# Patient Record
Sex: Male | Born: 1935 | Race: White | Hispanic: No | Smoking: Former smoker
Health system: Southern US, Community
[De-identification: ages and names within clinical notes are randomized; demographics above are authoritative.]

## PROBLEM LIST (undated history)

## (undated) DIAGNOSIS — N429 Disorder of prostate, unspecified: Secondary | ICD-10-CM

## (undated) DIAGNOSIS — E7800 Pure hypercholesterolemia, unspecified: Secondary | ICD-10-CM

## (undated) DIAGNOSIS — I442 Atrioventricular block, complete: Secondary | ICD-10-CM

## (undated) DIAGNOSIS — H9190 Unspecified hearing loss, unspecified ear: Secondary | ICD-10-CM

## (undated) DIAGNOSIS — Z950 Presence of cardiac pacemaker: Secondary | ICD-10-CM

## (undated) DIAGNOSIS — H409 Unspecified glaucoma: Secondary | ICD-10-CM

## (undated) DIAGNOSIS — R42 Dizziness and giddiness: Secondary | ICD-10-CM

## (undated) DIAGNOSIS — Z9189 Other specified personal risk factors, not elsewhere classified: Secondary | ICD-10-CM

## (undated) HISTORY — PX: HERNIA REPAIR: SHX51

## (undated) HISTORY — DX: Pure hypercholesterolemia, unspecified: E78.00

## (undated) HISTORY — PX: CHOLECYSTECTOMY: SHX55

---
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
## 2000-01-06 ENCOUNTER — Other Ambulatory Visit: Admission: RE | Admit: 2000-01-06 | Discharge: 2000-01-06 | Payer: Self-pay | Admitting: Urology

## 2001-05-16 ENCOUNTER — Other Ambulatory Visit: Admission: RE | Admit: 2001-05-16 | Discharge: 2001-05-16 | Payer: Self-pay | Admitting: Dermatology

## 2001-06-04 ENCOUNTER — Ambulatory Visit (HOSPITAL_COMMUNITY): Admission: RE | Admit: 2001-06-04 | Discharge: 2001-06-04 | Payer: Self-pay | Admitting: Family Medicine

## 2001-06-04 ENCOUNTER — Encounter: Payer: Self-pay | Admitting: Family Medicine

## 2001-06-10 ENCOUNTER — Encounter: Payer: Self-pay | Admitting: Family Medicine

## 2001-06-10 ENCOUNTER — Ambulatory Visit (HOSPITAL_COMMUNITY): Admission: RE | Admit: 2001-06-10 | Discharge: 2001-06-10 | Payer: Self-pay | Admitting: Family Medicine

## 2001-09-05 ENCOUNTER — Encounter: Payer: Self-pay | Admitting: Urology

## 2001-09-05 ENCOUNTER — Ambulatory Visit (HOSPITAL_COMMUNITY): Admission: RE | Admit: 2001-09-05 | Discharge: 2001-09-05 | Payer: Self-pay | Admitting: Urology

## 2003-03-26 ENCOUNTER — Encounter: Payer: Self-pay | Admitting: Family Medicine

## 2003-03-26 ENCOUNTER — Ambulatory Visit (HOSPITAL_COMMUNITY): Admission: RE | Admit: 2003-03-26 | Discharge: 2003-03-26 | Payer: Self-pay | Admitting: Family Medicine

## 2004-03-21 ENCOUNTER — Emergency Department (HOSPITAL_COMMUNITY): Admission: EM | Admit: 2004-03-21 | Discharge: 2004-03-21 | Payer: Self-pay | Admitting: Emergency Medicine

## 2004-10-03 ENCOUNTER — Other Ambulatory Visit: Admission: RE | Admit: 2004-10-03 | Discharge: 2004-10-03 | Payer: Self-pay | Admitting: Dermatology

## 2005-04-13 ENCOUNTER — Ambulatory Visit (HOSPITAL_COMMUNITY): Admission: RE | Admit: 2005-04-13 | Discharge: 2005-04-13 | Payer: Self-pay | Admitting: Family Medicine

## 2005-06-05 ENCOUNTER — Emergency Department (HOSPITAL_COMMUNITY): Admission: EM | Admit: 2005-06-05 | Discharge: 2005-06-05 | Payer: Self-pay | Admitting: Emergency Medicine

## 2007-02-22 ENCOUNTER — Ambulatory Visit (HOSPITAL_COMMUNITY): Admission: RE | Admit: 2007-02-22 | Discharge: 2007-02-22 | Payer: Self-pay | Admitting: Family Medicine

## 2011-12-07 ENCOUNTER — Encounter: Payer: Self-pay | Admitting: Internal Medicine

## 2011-12-08 ENCOUNTER — Encounter: Payer: Self-pay | Admitting: Internal Medicine

## 2011-12-08 ENCOUNTER — Ambulatory Visit (INDEPENDENT_AMBULATORY_CARE_PROVIDER_SITE_OTHER)
Admission: RE | Admit: 2011-12-08 | Discharge: 2011-12-08 | Disposition: A | Discharge status: Home / Self Care | Payer: Medicare Other | Source: Ambulatory Visit | Attending: Internal Medicine | Admitting: Internal Medicine

## 2011-12-08 ENCOUNTER — Ambulatory Visit (INDEPENDENT_AMBULATORY_CARE_PROVIDER_SITE_OTHER): Payer: Medicare Other | Admitting: Internal Medicine

## 2011-12-08 VITALS — BP 118/62 | HR 66 | Ht 68.0 in | Wt 165.8 lb

## 2011-12-08 DIAGNOSIS — J111 Influenza due to unidentified influenza virus with other respiratory manifestations: Secondary | ICD-10-CM

## 2011-12-08 DIAGNOSIS — J40 Bronchitis, not specified as acute or chronic: Secondary | ICD-10-CM

## 2011-12-08 DIAGNOSIS — H409 Unspecified glaucoma: Secondary | ICD-10-CM

## 2011-12-08 MED ORDER — AZITHROMYCIN 250 MG PO TABS: ORAL_TABLET | ORAL | Status: AC

## 2011-12-08 NOTE — Progress Notes (Signed)
12/08/11- 81 yoM former smoker referred by Dr Laverle Hobby of [facility] in [place], concerned about abnormal CXR. Mr. Bukhari smoked 2 packs per day for 12-15 years, quitting around 1948 but continuing snuff and chewing tobacco. He has not been aware of lung disease. Had flu vaccine. 3 or 4 weeks ago he had a head and chest cold with persistent cough. He improved then got worse again with sore throat. Cough was nonproductive. Fever for 2 days with myalgias. He hasn't been significantly short of breath. He went to the urgent care in [place] on December 26 her temperature was 98. Chest x-ray showed flattening of diaphragms especially on lateral view with question of pleural effusion and he was referred here. He has not been on antibiotics. Dr. Valentina Lucks feels stable without fever, chest pain, weight loss, swelling or nodes. I reviewed the chest x-ray images on disc provided and think we are probably seeing diaphragm flattening shadows rather than an effusion. He is a widower, retired from [employer]. 3 brothers were smokers, all dying of lung cancer.  ROS-see HPI Constitutional:   No-   weight loss, night sweats, fevers, chills, fatigue, lassitude. HEENT:   No-  headaches, difficulty swallowing, tooth/dental problems, sore throat,       No-  sneezing, itching, ear ache, nasal congestion, post nasal drip,  CV:  No-   chest pain, orthopnea, PND, swelling in lower extremities, anasarca, dizziness, palpitations Resp: Improving shortness of breath with exertion or at rest.              No-   productive cough,  Improved non-productive cough,  No- coughing up of blood.              No-   change in color of mucus.  No- wheezing.   Skin: No-   rash or lesions. GI:  No-   heartburn, indigestion, abdominal pain, nausea, vomiting, diarrhea,                 change in bowel habits, + loss of appetite just in last few days GU: MS:  No-   joint pain or swelling.  No-  decreased range of motion.  No- back pain. Neuro-     nothing unusual Psych:  No- change in mood or affect. No depression or anxiety.  No memory loss.  OBJ General- Alert, Oriented, Affect-appropriate, Distress- none acute, elderlySkin- rash-none, lesions- none, excoriation- none Lymphadenopathy- none Head- atraumatic            Eyes- Gross vision intact, PERRLA, conjunctivae clear secretions            Ears- Hearing, canals-normal            Nose- Clear, no-Septal dev, mucus, polyps, erosion, perforation             Throat- Mallampati II , mucosa red , drainage- none, tonsils- atrophic Neck- flexible , trachea midline, no stridor , thyroid nl, carotid no bruit Chest - symmetrical excursion , unlabored           Heart/CV- RRR , no murmur , no gallop  , no rub, nl s1 s2                           - JVD- none , edema- none, stasis changes- none, varices- none           Lung- bibasilar crackles, diminished in bases, wheeze- none, cough- none ,  dullness-none, rub- none           Chest wall-  Abd- tender-no, distended-no, bowel sounds-present, HSM- no Br/ Gen/ Rectal- Not done, not indicated Extrem- cyanosis- none, clubbing, none, atrophy- none, strength- nl Neuro- grossly intact to observation

## 2011-12-08 NOTE — Patient Instructions (Addendum)
Order CXR  Dx bronchitis   We will call you with the report- probably next week. If needed, we will get you back here for a follow-up visit.   Script sent for Z pak antibiotic

## 2011-12-10 ENCOUNTER — Encounter: Payer: Self-pay | Admitting: Internal Medicine

## 2011-12-10 DIAGNOSIS — J111 Influenza due to unidentified influenza virus with other respiratory manifestations: Secondary | ICD-10-CM | POA: Insufficient documentation

## 2011-12-10 DIAGNOSIS — H409 Unspecified glaucoma: Secondary | ICD-10-CM | POA: Insufficient documentation

## 2011-12-10 NOTE — Assessment & Plan Note (Signed)
He has had an acute bronchitis syndrome with a flulike illness. There is very likely to be some degree of underlying COPD which has not been significant enough that he paid attention. A pleural effusion was questioned but I don't think I see that on the available films. Plan-chest x-ray here, supportive care for viral bronchitis emphasizing fluids. Z-Pak to hold. No followup visit scheduled yet, respecting the effort it takes for him to get here. He does understand that I will be happy to see him again if this process does not clear or if our chest x-ray is abnormal..

## 2011-12-11 ENCOUNTER — Other Ambulatory Visit: Payer: Self-pay | Admitting: Internal Medicine

## 2011-12-11 DIAGNOSIS — R05 Cough: Secondary | ICD-10-CM

## 2011-12-11 NOTE — Progress Notes (Signed)
Quick Note:  Spoke with patient appt made for 01/09/2012 cxr at 1:45 appt with CY at 2:15 ______

## 2012-01-09 ENCOUNTER — Ambulatory Visit (INDEPENDENT_AMBULATORY_CARE_PROVIDER_SITE_OTHER): Payer: Medicare Other | Admitting: Internal Medicine

## 2012-01-09 ENCOUNTER — Ambulatory Visit (INDEPENDENT_AMBULATORY_CARE_PROVIDER_SITE_OTHER)
Admission: RE | Admit: 2012-01-09 | Discharge: 2012-01-09 | Disposition: A | Discharge status: Home / Self Care | Payer: Medicare Other | Source: Ambulatory Visit | Attending: Internal Medicine | Admitting: Internal Medicine

## 2012-01-09 ENCOUNTER — Encounter: Payer: Self-pay | Admitting: Internal Medicine

## 2012-01-09 VITALS — BP 118/66 | HR 74 | Ht 68.0 in | Wt 175.0 lb

## 2012-01-09 DIAGNOSIS — R918 Other nonspecific abnormal finding of lung field: Secondary | ICD-10-CM | POA: Diagnosis not present

## 2012-01-09 DIAGNOSIS — R05 Cough: Secondary | ICD-10-CM | POA: Diagnosis not present

## 2012-01-09 DIAGNOSIS — J111 Influenza due to unidentified influenza virus with other respiratory manifestations: Secondary | ICD-10-CM | POA: Diagnosis not present

## 2012-01-09 DIAGNOSIS — J9819 Other pulmonary collapse: Secondary | ICD-10-CM | POA: Diagnosis not present

## 2012-01-09 DIAGNOSIS — R9389 Abnormal findings on diagnostic imaging of other specified body structures: Secondary | ICD-10-CM

## 2012-01-09 NOTE — Patient Instructions (Signed)
Please call as needed 

## 2012-01-09 NOTE — Progress Notes (Signed)
12/08/11- 78 yoM former smoker referred by Dr Laverle Hobby of [facility] in [place], concerned about abnormal CXR. Chase Chase smoked 2 packs per day for 12-15 years, quitting around 1948 but continuing snuff and chewing tobacco. He has not been aware of lung disease. Had flu vaccine. 3 or 4 weeks ago he had a head and chest cold with persistent cough. He improved then got worse again with sore throat. Cough was nonproductive. Fever for 2 days with myalgias. He hasn't been significantly short of breath. He went to the urgent care in [place] on December 26 where temperature was 98. Chest x-ray showed flattening of diaphragms especially on lateral view with question of pleural effusion and he was referred here. He has not been on antibiotics. Chase Chase feels stable without fever, chest pain, weight loss, swelling or nodes. I reviewed the chest x-ray images on disc provided and think we are probably seeing diaphragm flattening shadows rather than an effusion. He is a widower, retired from [employer]. 3 brothers were smokers, all dying of lung cancer.  01/09/12-  36 yoM former smoker referred by Dr Laverle Hobby of [facility] in [place], concerned about abnormal CXR Chase Chase feels back to her normal baseline for him and denies cough or any chest discomfort or dyspnea. He did take the Z-Pak we gave at last visit.  ROS-see HPI Constitutional:   No-   weight loss, night sweats, fevers, chills, fatigue, lassitude. HEENT:   No-  headaches, difficulty swallowing, tooth/dental problems, sore throat,       No-  sneezing, itching, ear ache, nasal congestion, post nasal drip,  CV:  No-   chest pain, orthopnea, PND, swelling in lower extremities, anasarca, dizziness, palpitations Resp: Improving shortness of breath with exertion or at rest.              No-   productive cough,  Improved non-productive cough,  No- coughing up of  blood.              No-   change in color of mucus.  No- wheezing.   Skin: No-   rash or lesions. GI:  No-   heartburn, indigestion, abdominal pain, nausea, vomiting, diarrhea,                 change in bowel habits, + loss of appetite just in last few days GU: MS:  No-   joint pain or swelling.  No- decreased range of motion.  No- back pain. Neuro-     nothing unusual Psych:  No- change in mood or affect. No depression or anxiety.  No memory loss.  OBJ General- Alert, Oriented, Affect-appropriate, Distress- none acute, elderlySkin- rash-none, lesions- none, excoriation- none Lymphadenopathy- none Head- atraumatic            Eyes- Gross vision intact, PERRLA, conjunctivae clear secretions            Ears- Hearing, canals-normal            Nose- Clear, no-Septal dev, mucus, polyps, erosion, perforation             Throat- Mallampati II , mucosa red , drainage- none, tonsils- atrophic Neck- flexible , trachea midline, no stridor , thyroid nl, carotid no bruit Chest - symmetrical excursion , unlabored           Heart/CV- RRR , no murmur , no gallop  , no rub, nl s1 s2                           -  JVD- none , edema- none, stasis changes- none, varices- none           Lung- coarse breath sounds,  wheeze- none, cough- none , dullness-none, rub- none           Chest wall-  Abd- Br/ Gen/ Rectal- Not done, not indicated Extrem- cyanosis- none, clubbing, none, atrophy- none, strength- nl Neuro- grossly intact to observation

## 2012-01-10 ENCOUNTER — Encounter: Payer: Self-pay | Admitting: Internal Medicine

## 2012-01-10 DIAGNOSIS — R9389 Abnormal findings on diagnostic imaging of other specified body structures: Secondary | ICD-10-CM | POA: Insufficient documentation

## 2012-01-10 NOTE — Assessment & Plan Note (Signed)
He feels his acute symptoms are back to baseline for him now and he is not concerned at all. I I assume he has some degree of underlying COPD from his previous years of smoking, but at age 78 without acute complaint, I have not suggested PFT or medications for now.

## 2012-01-10 NOTE — Assessment & Plan Note (Signed)
Chest x-ray was done on arrival and I reviewed the images with him before radiologic interpretation was available. On my small screen, I noted some hyperinflation and some scarring changes in the bases but no effusion. At the time he left the office, our understanding was that there was no active disease, and no effusion requiring attention. We are calling him back to return for followup chest x-ray in 2 months because of the radiology report.

## 2012-01-17 NOTE — Progress Notes (Signed)
Quick Note:  LMTCB ______ 

## 2012-01-26 ENCOUNTER — Telehealth: Payer: Self-pay | Admitting: Internal Medicine

## 2012-01-26 DIAGNOSIS — R9389 Abnormal findings on diagnostic imaging of other specified body structures: Secondary | ICD-10-CM

## 2012-01-26 NOTE — Telephone Encounter (Signed)
CXR- I had told him, based on my small screen images, that the CXR looked clear. Unfortunately, the radiologist, looking at larger images, noted a shadow in the left lung that they want [place] to look at again. I would like him to come back for another CXR here in 2 months for comparison.  I spoke with pt and is aware of CDY response. He is aware ot come back in 2 months for a comparison and order has been placed in [facility]. Pt aware once he has this done will call him with the results

## 2012-01-26 NOTE — Progress Notes (Signed)
Quick Note:  LMTCB ______ 

## 2012-03-18 DIAGNOSIS — N400 Enlarged prostate without lower urinary tract symptoms: Secondary | ICD-10-CM | POA: Diagnosis not present

## 2012-03-18 DIAGNOSIS — R972 Elevated prostate specific antigen [PSA]: Secondary | ICD-10-CM | POA: Diagnosis not present

## 2012-03-19 DIAGNOSIS — N401 Enlarged prostate with lower urinary tract symptoms: Secondary | ICD-10-CM | POA: Diagnosis not present

## 2012-03-21 ENCOUNTER — Ambulatory Visit (INDEPENDENT_AMBULATORY_CARE_PROVIDER_SITE_OTHER)
Admission: RE | Admit: 2012-03-21 | Discharge: 2012-03-21 | Disposition: A | Discharge status: Home / Self Care | Payer: Medicare Other | Source: Ambulatory Visit | Attending: Internal Medicine | Admitting: Internal Medicine

## 2012-03-21 DIAGNOSIS — R918 Other nonspecific abnormal finding of lung field: Secondary | ICD-10-CM

## 2012-03-21 DIAGNOSIS — J984 Other disorders of lung: Secondary | ICD-10-CM | POA: Diagnosis not present

## 2012-03-21 DIAGNOSIS — R9389 Abnormal findings on diagnostic imaging of other specified body structures: Secondary | ICD-10-CM

## 2012-03-22 NOTE — Progress Notes (Signed)
Quick Note:  Pt aware of results. ______ 

## 2012-04-04 DIAGNOSIS — R972 Elevated prostate specific antigen [PSA]: Secondary | ICD-10-CM | POA: Diagnosis not present

## 2012-04-04 DIAGNOSIS — N400 Enlarged prostate without lower urinary tract symptoms: Secondary | ICD-10-CM | POA: Diagnosis not present

## 2012-05-20 DIAGNOSIS — Z125 Encounter for screening for malignant neoplasm of prostate: Secondary | ICD-10-CM | POA: Diagnosis not present

## 2012-05-20 DIAGNOSIS — K219 Gastro-esophageal reflux disease without esophagitis: Secondary | ICD-10-CM | POA: Diagnosis not present

## 2012-05-20 DIAGNOSIS — Z6826 Body mass index (BMI) 26.0-26.9, adult: Secondary | ICD-10-CM | POA: Diagnosis not present

## 2012-05-20 DIAGNOSIS — E785 Hyperlipidemia, unspecified: Secondary | ICD-10-CM | POA: Diagnosis not present

## 2012-05-20 DIAGNOSIS — Z79899 Other long term (current) drug therapy: Secondary | ICD-10-CM | POA: Diagnosis not present

## 2012-05-20 DIAGNOSIS — I10 Essential (primary) hypertension: Secondary | ICD-10-CM | POA: Diagnosis not present

## 2012-06-05 DIAGNOSIS — W57XXXA Bitten or stung by nonvenomous insect and other nonvenomous arthropods, initial encounter: Secondary | ICD-10-CM | POA: Diagnosis not present

## 2012-06-05 DIAGNOSIS — Z23 Encounter for immunization: Secondary | ICD-10-CM | POA: Diagnosis not present

## 2012-06-05 DIAGNOSIS — T148 Other injury of unspecified body region: Secondary | ICD-10-CM | POA: Diagnosis not present

## 2012-06-05 DIAGNOSIS — S41109A Unspecified open wound of unspecified upper arm, initial encounter: Secondary | ICD-10-CM | POA: Diagnosis not present

## 2012-09-19 DIAGNOSIS — Z23 Encounter for immunization: Secondary | ICD-10-CM | POA: Diagnosis not present

## 2012-10-08 DIAGNOSIS — J029 Acute pharyngitis, unspecified: Secondary | ICD-10-CM | POA: Diagnosis not present

## 2012-10-08 DIAGNOSIS — F528 Other sexual dysfunction not due to a substance or known physiological condition: Secondary | ICD-10-CM | POA: Diagnosis not present

## 2012-10-08 DIAGNOSIS — IMO0002 Reserved for concepts with insufficient information to code with codable children: Secondary | ICD-10-CM | POA: Diagnosis not present

## 2013-01-27 DIAGNOSIS — H52229 Regular astigmatism, unspecified eye: Secondary | ICD-10-CM | POA: Diagnosis not present

## 2013-01-27 DIAGNOSIS — H4011X0 Primary open-angle glaucoma, stage unspecified: Secondary | ICD-10-CM | POA: Diagnosis not present

## 2013-01-27 DIAGNOSIS — H5231 Anisometropia: Secondary | ICD-10-CM | POA: Diagnosis not present

## 2013-01-27 DIAGNOSIS — H524 Presbyopia: Secondary | ICD-10-CM | POA: Diagnosis not present

## 2013-01-28 DIAGNOSIS — H40059 Ocular hypertension, unspecified eye: Secondary | ICD-10-CM | POA: Diagnosis not present

## 2013-02-11 DIAGNOSIS — H16009 Unspecified corneal ulcer, unspecified eye: Secondary | ICD-10-CM | POA: Diagnosis not present

## 2013-04-08 ENCOUNTER — Emergency Department (HOSPITAL_COMMUNITY): Payer: Medicare Other

## 2013-04-08 ENCOUNTER — Inpatient Hospital Stay (HOSPITAL_COMMUNITY)
Admission: EM | Admit: 2013-04-08 | Discharge: 2013-04-10 | DRG: 690 | Disposition: A | Discharge status: Home / Self Care | Payer: Medicare Other | Attending: Internal Medicine | Admitting: Internal Medicine

## 2013-04-08 ENCOUNTER — Encounter (HOSPITAL_COMMUNITY): Payer: Self-pay

## 2013-04-08 DIAGNOSIS — Z803 Family history of malignant neoplasm of breast: Secondary | ICD-10-CM | POA: Diagnosis not present

## 2013-04-08 DIAGNOSIS — R51 Headache: Secondary | ICD-10-CM | POA: Diagnosis not present

## 2013-04-08 DIAGNOSIS — E860 Dehydration: Secondary | ICD-10-CM | POA: Diagnosis not present

## 2013-04-08 DIAGNOSIS — A419 Sepsis, unspecified organism: Secondary | ICD-10-CM

## 2013-04-08 DIAGNOSIS — B964 Proteus (mirabilis) (morganii) as the cause of diseases classified elsewhere: Secondary | ICD-10-CM | POA: Diagnosis present

## 2013-04-08 DIAGNOSIS — R42 Dizziness and giddiness: Secondary | ICD-10-CM | POA: Diagnosis present

## 2013-04-08 DIAGNOSIS — N390 Urinary tract infection, site not specified: Secondary | ICD-10-CM | POA: Diagnosis not present

## 2013-04-08 DIAGNOSIS — J111 Influenza due to unidentified influenza virus with other respiratory manifestations: Secondary | ICD-10-CM

## 2013-04-08 DIAGNOSIS — R404 Transient alteration of awareness: Secondary | ICD-10-CM | POA: Diagnosis not present

## 2013-04-08 DIAGNOSIS — R509 Fever, unspecified: Secondary | ICD-10-CM | POA: Diagnosis not present

## 2013-04-08 DIAGNOSIS — R9431 Abnormal electrocardiogram [ECG] [EKG]: Secondary | ICD-10-CM | POA: Diagnosis not present

## 2013-04-08 DIAGNOSIS — H409 Unspecified glaucoma: Secondary | ICD-10-CM | POA: Diagnosis present

## 2013-04-08 DIAGNOSIS — S0993XA Unspecified injury of face, initial encounter: Secondary | ICD-10-CM | POA: Diagnosis not present

## 2013-04-08 DIAGNOSIS — Z801 Family history of malignant neoplasm of trachea, bronchus and lung: Secondary | ICD-10-CM

## 2013-04-08 DIAGNOSIS — M542 Cervicalgia: Secondary | ICD-10-CM | POA: Diagnosis not present

## 2013-04-08 DIAGNOSIS — E7800 Pure hypercholesterolemia, unspecified: Secondary | ICD-10-CM | POA: Diagnosis present

## 2013-04-08 DIAGNOSIS — Z87891 Personal history of nicotine dependence: Secondary | ICD-10-CM

## 2013-04-08 DIAGNOSIS — D72829 Elevated white blood cell count, unspecified: Secondary | ICD-10-CM | POA: Diagnosis present

## 2013-04-08 DIAGNOSIS — R55 Syncope and collapse: Secondary | ICD-10-CM | POA: Diagnosis not present

## 2013-04-08 DIAGNOSIS — S0990XA Unspecified injury of head, initial encounter: Secondary | ICD-10-CM | POA: Diagnosis not present

## 2013-04-08 DIAGNOSIS — J984 Other disorders of lung: Secondary | ICD-10-CM | POA: Diagnosis not present

## 2013-04-08 DIAGNOSIS — R7309 Other abnormal glucose: Secondary | ICD-10-CM | POA: Diagnosis present

## 2013-04-08 DIAGNOSIS — Z87898 Personal history of other specified conditions: Secondary | ICD-10-CM

## 2013-04-08 DIAGNOSIS — R9389 Abnormal findings on diagnostic imaging of other specified body structures: Secondary | ICD-10-CM

## 2013-04-08 HISTORY — DX: Disorder of prostate, unspecified: N42.9

## 2013-04-08 LAB — URINALYSIS, ROUTINE W REFLEX MICROSCOPIC
Glucose, UA: NEGATIVE mg/dL
Nitrite: NEGATIVE
Specific Gravity, Urine: 1.02 (ref 1.005–1.030)
pH: 6 (ref 5.0–8.0)

## 2013-04-08 LAB — CBC WITH DIFFERENTIAL/PLATELET
Basophils Relative: 0 % (ref 0–1)
Eosinophils Absolute: 0 10*3/uL (ref 0.0–0.7)
Eosinophils Relative: 0 % (ref 0–5)
HCT: 46.8 % (ref 39.0–52.0)
Hemoglobin: 16.4 g/dL (ref 13.0–17.0)
Lymphs Abs: 0.9 10*3/uL (ref 0.7–4.0)
MCH: 35.2 pg — ABNORMAL HIGH (ref 26.0–34.0)
MCHC: 35 g/dL (ref 30.0–36.0)
MCV: 100.4 fL — ABNORMAL HIGH (ref 78.0–100.0)
Monocytes Relative: 10 % (ref 3–12)
Neutrophils Relative %: 85 % — ABNORMAL HIGH (ref 43–77)
Platelets: 151 10*3/uL (ref 150–400)
RBC: 4.66 MIL/uL (ref 4.22–5.81)
RDW: 12.2 % (ref 11.5–15.5)

## 2013-04-08 LAB — COMPREHENSIVE METABOLIC PANEL
Albumin: 3.5 g/dL (ref 3.5–5.2)
Alkaline Phosphatase: 87 U/L (ref 39–117)
BUN: 19 mg/dL (ref 6–23)
Calcium: 9.3 mg/dL (ref 8.4–10.5)
Creatinine, Ser: 1.11 mg/dL (ref 0.50–1.35)
GFR calc Af Amer: 66 mL/min — ABNORMAL LOW (ref >90)
GFR calc non Af Amer: 57 mL/min — ABNORMAL LOW (ref >90)
Glucose, Bld: 174 mg/dL — ABNORMAL HIGH (ref 70–99)
Sodium: 134 mEq/L — ABNORMAL LOW (ref 135–145)
Total Protein: 7 g/dL (ref 6.0–8.3)

## 2013-04-08 LAB — URINE MICROSCOPIC-ADD ON

## 2013-04-08 MED ORDER — SODIUM CHLORIDE 0.9 % IV SOLN
INTRAVENOUS | Status: AC
  Administered 2013-04-08 – 2013-04-09 (×2): via INTRAVENOUS

## 2013-04-08 MED ORDER — ALBUTEROL SULFATE (5 MG/ML) 0.5% IN NEBU: 2.5000 mg | INHALATION_SOLUTION | RESPIRATORY_TRACT | Status: DC | PRN

## 2013-04-08 MED ORDER — ENOXAPARIN SODIUM 40 MG/0.4ML ~~LOC~~ SOLN
40.0000 mg | SUBCUTANEOUS | Status: DC
  Administered 2013-04-09 (×2): 40 mg via SUBCUTANEOUS
  Filled 2013-04-08 (×2): qty 0.4

## 2013-04-08 MED ORDER — ACETAMINOPHEN 325 MG PO TABS
650.0000 mg | ORAL_TABLET | Freq: Once | ORAL | Status: AC
  Administered 2013-04-08: 650 mg via ORAL
  Filled 2013-04-08: qty 2

## 2013-04-08 MED ORDER — ONDANSETRON HCL 4 MG/2ML IJ SOLN
4.0000 mg | Freq: Four times a day (QID) | INTRAMUSCULAR | Status: DC | PRN
  Administered 2013-04-09: 4 mg via INTRAVENOUS
  Filled 2013-04-08: qty 2

## 2013-04-08 MED ORDER — DEXTROSE 5 % IV SOLN
1.0000 g | INTRAVENOUS | Status: DC
  Administered 2013-04-09: 1 g via INTRAVENOUS
  Filled 2013-04-08: qty 10

## 2013-04-08 MED ORDER — DEXTROSE 5 % IV SOLN
1.0000 g | Freq: Once | INTRAVENOUS | Status: AC
  Administered 2013-04-08: 1 g via INTRAVENOUS
  Filled 2013-04-08: qty 10

## 2013-04-08 MED ORDER — SODIUM CHLORIDE 0.9 % IJ SOLN
3.0000 mL | Freq: Two times a day (BID) | INTRAMUSCULAR | Status: DC
  Administered 2013-04-09: 3 mL via INTRAVENOUS

## 2013-04-08 MED ORDER — ONDANSETRON HCL 4 MG PO TABS: 4.0000 mg | ORAL_TABLET | Freq: Four times a day (QID) | ORAL | Status: DC | PRN

## 2013-04-08 MED ORDER — SIMVASTATIN 10 MG PO TABS
10.0000 mg | ORAL_TABLET | Freq: Every day | ORAL | Status: DC
  Administered 2013-04-09: 10 mg via ORAL
  Filled 2013-04-08: qty 1

## 2013-04-08 MED ORDER — ACETAMINOPHEN 650 MG RE SUPP: 650.0000 mg | Freq: Four times a day (QID) | RECTAL | Status: DC | PRN

## 2013-04-08 MED ORDER — SODIUM CHLORIDE 0.9 % IV SOLN
INTRAVENOUS | Status: DC
  Administered 2013-04-08: 18:00:00 via INTRAVENOUS

## 2013-04-08 MED ORDER — NICOTINE POLACRILEX 2 MG MT GUM
4.0000 mg | CHEWING_GUM | OROMUCOSAL | Status: DC | PRN
  Filled 2013-04-08: qty 2

## 2013-04-08 MED ORDER — ACETAMINOPHEN 325 MG PO TABS
650.0000 mg | ORAL_TABLET | Freq: Four times a day (QID) | ORAL | Status: DC | PRN
  Administered 2013-04-09: 650 mg via ORAL
  Filled 2013-04-08 (×2): qty 2

## 2013-04-08 MED ORDER — MECLIZINE HCL 12.5 MG PO TABS: 25.0000 mg | ORAL_TABLET | Freq: Three times a day (TID) | ORAL | Status: DC | PRN

## 2013-04-08 NOTE — ED Notes (Signed)
Per ems, pt was found laying in the floor upon their arrival.  Pt reports " i felt really dizzy, so i just sat down and then couldn't get back up".  Pt is alert in triage.  Pt denies any loc or head injury.

## 2013-04-08 NOTE — ED Provider Notes (Signed)
History     CSN: [id number]  Arrival date & time 04/08/13  1758   First MD Initiated Contact with Patient 04/08/13 1807      Chief Complaint  Patient presents with  . Dizziness    (Consider location/radiation/quality/duration/timing/severity/associated sxs/prior treatment) Patient is a 79 y.o. male presenting with weakness. The history is provided by the patient. No language interpreter was used.  Weakness Chronicity: Patient is an 79 year old man with a history of dizziness. His dizziness seemed to get worse the past couple of days. Dizziness was particularly bad today and this afternoon he slipped out of the car but didn't lose consciousness. Then he couldn't get up.  The current episode started 1 to 2 hours ago. The problem occurs constantly. The problem has been gradually worsening. Pertinent negatives include no chest pain, no abdominal pain, no headaches and no shortness of breath. Associated symptoms comments: Pain in right side of neck.. Nothing aggravates the symptoms. Nothing relieves the symptoms. He has tried nothing for the symptoms. The treatment provided no relief.    Past Medical History  Diagnosis Date  . Hypercholesterolemia   . Glaucoma(365)     Past Surgical History  Procedure Laterality Date  . Cholecystectomy    . Hernia repair      bilateral    Family History  Problem Relation Age of Onset  . Breast cancer Sister   . Lung cancer Brother   . Lung cancer Brother   . Lung cancer Brother   . Breast cancer Sister   . Breast cancer Sister     History  Substance Use Topics  . Smoking status: Former Smoker -- 2.00 packs/day for 8 years    Types: Cigarettes  . Smokeless tobacco: Former Neurosurgeon    Quit date: 10/12/2011     Comment: Chewing and snuff  . Alcohol Use: No      Review of Systems  Constitutional: Positive for fever.  HENT: Positive for neck pain.   Eyes: Negative.   Respiratory: Negative.  Negative for shortness of breath.    Cardiovascular: Negative for chest pain.  Gastrointestinal: Negative.  Negative for abdominal pain.  Genitourinary: Positive for frequency and difficulty urinating.  Skin: Negative.   Neurological: Positive for dizziness and weakness. Negative for headaches.  Psychiatric/Behavioral: Negative.     Allergies  Review of patient's allergies indicates no known allergies.  Home Medications   Current Outpatient Rx  Name  Route  Sig  Dispense  Refill  . acetaminophen (TYLENOL) 500 MG tablet   Oral   Take 500 mg by mouth every 6 (six) hours as needed.           . dorzolamide-timolol (COSOPT) 22.3-6.8 MG/ML ophthalmic solution      1 drop in each eye once daily         . lovastatin (MEVACOR) 20 MG tablet   Oral   Take 1 tablet by mouth daily.           BP 113/63  Pulse 106  Temp(Src) 101 F (38.3 C) (Oral)  Resp 19  SpO2 93%  Physical Exam  Nursing note and vitals reviewed. Constitutional: He is oriented to person, place, and time.  Elderly man complaining of pain in neck, has T 101.  HENT:  Head: Normocephalic and atraumatic.  Right Ear: External ear normal.  Left Ear: External ear normal.  Mouth/Throat: Oropharynx is clear and moist.  Eyes: Conjunctivae and EOM are normal. Pupils are equal, round, and reactive to light.  Neck: Normal range of motion. Neck supple.  Cardiovascular: Normal rate, regular rhythm and normal heart sounds.   Pulmonary/Chest: Effort normal and breath sounds normal.  Abdominal: Soft. Bowel sounds are normal.  Musculoskeletal: Normal range of motion. He exhibits no edema and no tenderness.  Neurological: He is alert and oriented to person, place, and time.  No sensory or motor deficit.  Skin: Skin is warm and dry.  Psychiatric: He has a normal mood and affect. His behavior is normal.    ED Course  Procedures (including critical care time)  Labs Reviewed  CBC WITH DIFFERENTIAL  COMPREHENSIVE METABOLIC PANEL  URINALYSIS, ROUTINE W  REFLEX MICROSCOPIC   6:23 PM  Date: 04/08/2013  Rate: 103  Rhythm: sinus tachycardia  QRS Axis: normal  Intervals: QT prolonged  ST/T Wave abnormalities: normal  Conduction Disutrbances:right bundle branch block  Narrative Interpretation: Abnormal EKG  Old EKG Reviewed: none available  6:29 PM Pt seen --> physical exam performed.  Lab workup ordered.  PO acetaminophen ordered.  8:42 PM Results for orders placed during the hospital encounter of 04/08/13  CBC WITH DIFFERENTIAL      Result Value Range   WBC 18.6 (*) 4.0 - 10.5 K/uL   RBC 4.66  4.22 - 5.81 MIL/uL   Hemoglobin 16.4  13.0 - 17.0 g/dL   HCT 96.0  45.4 - 09.8 %   MCV 100.4 (*) 78.0 - 100.0 fL   MCH 35.2 (*) 26.0 - 34.0 pg   MCHC 35.0  30.0 - 36.0 g/dL   RDW 11.9  14.7 - 82.9 %   Platelets 151  150 - 400 K/uL   Neutrophils Relative 85 (*) 43 - 77 %   Neutro Abs 15.9 (*) 1.7 - 7.7 K/uL   Lymphocytes Relative 5 (*) 12 - 46 %   Lymphs Abs 0.9  0.7 - 4.0 K/uL   Monocytes Relative 10  3 - 12 %   Monocytes Absolute 1.9 (*) 0.1 - 1.0 K/uL   Eosinophils Relative 0  0 - 5 %   Eosinophils Absolute 0.0  0.0 - 0.7 K/uL   Basophils Relative 0  0 - 1 %   Basophils Absolute 0.0  0.0 - 0.1 K/uL  COMPREHENSIVE METABOLIC PANEL      Result Value Range   Sodium 134 (*) 135 - 145 mEq/L   Potassium 3.6  3.5 - 5.1 mEq/L   Chloride 96  96 - 112 mEq/L   CO2 25  19 - 32 mEq/L   Glucose, Bld 174 (*) 70 - 99 mg/dL   BUN 19  6 - 23 mg/dL   Creatinine, Ser 5.62  0.50 - 1.35 mg/dL   Calcium 9.3  8.4 - 13.0 mg/dL   Total Protein 7.0  6.0 - 8.3 g/dL   Albumin 3.5  3.5 - 5.2 g/dL   AST 16  0 - 37 U/L   ALT 14  0 - 53 U/L   Alkaline Phosphatase 87  39 - 117 U/L   Total Bilirubin 1.2  0.3 - 1.2 mg/dL   GFR calc non Af Amer 57 (*) >90 mL/min   GFR calc Af Amer 66 (*) >90 mL/min  URINALYSIS, ROUTINE W REFLEX MICROSCOPIC      Result Value Range   Color, Urine YELLOW  YELLOW   APPearance HAZY (*) CLEAR   Specific Gravity, Urine 1.020   1.005 - 1.030   pH 6.0  5.0 - 8.0   Glucose, UA NEGATIVE  NEGATIVE mg/dL  Hgb urine dipstick SMALL (*) NEGATIVE   Bilirubin Urine NEGATIVE  NEGATIVE   Ketones, ur TRACE (*) NEGATIVE mg/dL   Protein, ur 30 (*) NEGATIVE mg/dL   Urobilinogen, UA 0.2  0.0 - 1.0 mg/dL   Nitrite NEGATIVE  NEGATIVE   Leukocytes, UA SMALL (*) NEGATIVE  URINE MICROSCOPIC-ADD ON      Result Value Range   Squamous Epithelial / LPF RARE  RARE   WBC, UA TOO NUMEROUS TO COUNT  <3 WBC/hpf   RBC / HPF 3-6  <3 RBC/hpf   Bacteria, UA MANY (*) RARE   Dg Chest 1 View  04/08/2013  *RADIOLOGY REPORT*  Clinical Data: Severe dizziness.  Ex-smoker.  CHEST - 1 VIEW  Comparison: 03/21/2012.  Findings: Normal sized heart.  Linear density at the medial left lung base.  Progressive focal pleural density at the left lateral lung base.  Clear right lung.  Diffuse osteopenia.  Right upper quadrant abdominal surgical clips.  Mild to moderate scoliosis.  IMPRESSION: Linear atelectasis or scarring at the left lung base with a small amount of left lateral pleural thickening or loculated fluid.   Original Report Authenticated By: Beckie Salts, M.D.    Ct Head Wo Contrast  04/08/2013  *RADIOLOGY REPORT*  Clinical Data:  79 year old male with dizziness, fall with neck pain and headache.  CT HEAD WITHOUT CONTRAST CT CERVICAL SPINE WITHOUT CONTRAST  Technique:  Multidetector CT imaging of the head and cervical spine was performed following the standard protocol without intravenous contrast.  Multiplanar CT image reconstructions of the cervical spine were also generated.  Comparison:  None  CT HEAD  Findings: A small remote right cerebellar infarct is present. No acute intracranial abnormalities are identified, including mass lesion or mass effect, hydrocephalus, extra-axial fluid collection, midline shift, hemorrhage, or acute infarction.  The visualized bony calvarium is unremarkable.  IMPRESSION: No evidence of acute intracranial abnormality.  Small  remote right cerebellar infarct.  CT CERVICAL SPINE  Findings: Normal alignment is noted. There is no evidence of fracture, subluxation or prevertebral soft tissue swelling. Moderate to severe degenerative disc disease and spondylosis from C4-C7 noted with partial fusion of C5-C6.  Facet arthropathy is also identified.  These degenerative changes contribute to mild biforaminal narrowing from C4-C7. No focal bony lesions are identified.  IMPRESSION: No static evidence of acute injury to the cervical spine.  Moderate to severe degenerative disc disease/spondylosis from C4- C7.   Original Report Authenticated By: Harmon Pier, M.D.    Ct Cervical Spine Wo Contrast  04/08/2013  *RADIOLOGY REPORT*  Clinical Data:  79 year old male with dizziness, fall with neck pain and headache.  CT HEAD WITHOUT CONTRAST CT CERVICAL SPINE WITHOUT CONTRAST  Technique:  Multidetector CT imaging of the head and cervical spine was performed following the standard protocol without intravenous contrast.  Multiplanar CT image reconstructions of the cervical spine were also generated.  Comparison:  None  CT HEAD  Findings: A small remote right cerebellar infarct is present. No acute intracranial abnormalities are identified, including mass lesion or mass effect, hydrocephalus, extra-axial fluid collection, midline shift, hemorrhage, or acute infarction.  The visualized bony calvarium is unremarkable.  IMPRESSION: No evidence of acute intracranial abnormality.  Small remote right cerebellar infarct.  CT CERVICAL SPINE  Findings: Normal alignment is noted. There is no evidence of fracture, subluxation or prevertebral soft tissue swelling. Moderate to severe degenerative disc disease and spondylosis from C4-C7 noted with partial fusion of C5-C6.  Facet arthropathy is also identified.  These  degenerative changes contribute to mild biforaminal narrowing from C4-C7. No focal bony lesions are identified.  IMPRESSION: No static evidence of acute  injury to the cervical spine.  Moderate to severe degenerative disc disease/spondylosis from C4- C7.   Original Report Authenticated By: Harmon Pier, M.D.    Lab workup shows pt has a UTI, with WBC's TNTC in the urine and WBC 18,600 on the CBC.  CT of C-spine showed degenerative disease.  Will request [facility] to admit him for treatment of urosepsis.    1. Urosepsis        Carleene Cooper III, MD 04/08/13 2122

## 2013-04-08 NOTE — H&P (Signed)
[facility] History and Physical  Chase Chase ZOX:[id number] DOB: 11-07-1922 DOA: 04/08/2013   PCP: Kirk Ruths, MD  Specialists: He is not followed by any specialists  Chief Complaint: Dizziness, and fever  HPI: Chase Chase is a 79 y.o. male with a past medical history of hypercholesterolemia, and prostate problems in the past, who also has vertigo. He was in his usual state of health earlier today when he felt extremely dizzy when he stood up at home. He then fell down to the floor. However, denies any loss of consciousness. No head injuries. He called his daughter. EMS was called and he was brought into the hospital. He was on the floor for about 30 minutes. He does have a history of vertigo. Unfortunately he was using expired meclizine. But he tells me that today's episode felt more as if his head was swimming rather than a true vertiginous spell. No nausea, vomiting, no diarrhea recently. He's had frequent urinations, but denies any burning sensation. He's also noticed decrease in the amount of urine. He also had fever last night and was found to be febrile in the emergency department. He denies any chest pain or shortness of breath. No headaches. No focal deficits. He does complain of some soreness in the right side of the neck, which has been ongoing for one year. Currently no longer feeling dizzy while he is sitting down on the bed.  Home Medications: Prior to Admission medications   Medication Sig Start Date End Date Taking? Authorizing Provider  acetaminophen (TYLENOL) 500 MG tablet Take 500 mg by mouth every 6 (six) hours as needed.     Yes Historical Provider, MD  lovastatin (MEVACOR) 20 MG tablet Take 1 tablet by mouth daily with supper.  11/10/11  Yes Historical Provider, MD  meclizine (ANTIVERT) 25 MG tablet Take 25 mg by mouth 3 (three) times daily as needed for dizziness.   Yes Historical Provider, MD  nicotine polacrilex (NICORETTE) 4 MG gum Take 4 mg by mouth as  needed for smoking cessation.   Yes Historical Provider, MD    Allergies: No Known Allergies  Past Medical History: Past Medical History  Diagnosis Date  . Hypercholesterolemia   . Glaucoma(365)   . Prostate disease     unspecified    Past Surgical History  Procedure Laterality Date  . Cholecystectomy    . Hernia repair      bilateral    Social History:  reports that he has quit smoking. His smoking use included Cigarettes. He has a 16 pack-year smoking history. He quit smokeless tobacco use about 17 months ago. He reports that he does not drink alcohol or use illicit drugs.  Living Situation: He lives by himself Activity Level: Usually independent with daily activities   Family History:  Family History  Problem Relation Age of Onset  . Breast cancer Sister   . Lung cancer Brother   . Lung cancer Brother   . Lung cancer Brother   . Breast cancer Sister   . Breast cancer Sister      Review of Systems - History obtained from the patient General ROS: positive for  - fatigue Psychological ROS: negative Ophthalmic ROS: negative ENT ROS: negative Allergy and Immunology ROS: negative Hematological and Lymphatic ROS: negative Endocrine ROS: negative Respiratory ROS: no cough, shortness of breath, or wheezing Cardiovascular ROS: no chest pain or dyspnea on exertion Gastrointestinal ROS: no abdominal pain, change in bowel habits, or black or bloody stools Genito-Urinary ROS: no  dysuria, trouble voiding, or hematuria Musculoskeletal ROS: negative Neurological ROS: as in hpi Dermatological ROS: negative  Physical Examination  Filed Vitals:   04/08/13 1800 04/08/13 2031 04/08/13 2100  BP: 113/63 112/51 116/56  Pulse: 106 83 77  Temp: 101 F (38.3 C) 98.8 F (37.1 C)   TempSrc: Oral Oral   Resp: 19 20 20   SpO2: 93% 94%     General appearance: alert, cooperative, appears stated age and no distress Head: Normocephalic, without obvious abnormality, atraumatic Eyes:  conjunctivae/corneas clear. PERRL, EOM's intact.  Throat: lips, mucosa, and tongue normal; teeth and gums normal Neck: no adenopathy, no carotid bruit, no JVD, supple, symmetrical, trachea midline and thyroid not enlarged, symmetric, no tenderness/mass/nodules Resp: clear to auscultation bilaterally Cardio: regular rate and rhythm, S1, S2 normal, no murmur, click, rub or gallop GI: soft, non-tender; bowel sounds normal; no masses,  no organomegaly Extremities: extremities normal, atraumatic, no cyanosis or edema Pulses: 2+ and symmetric Skin: Skin color, texture, turgor normal. No rashes or lesions Lymph nodes: Cervical, supraclavicular, and axillary nodes normal. Neurologic: He is alert and oriented x3. Cranial nerves are intact. No focal neurological deficits  Laboratory Data: Results for orders placed during the hospital encounter of 04/08/13 (from the past 48 hour(s))  CBC WITH DIFFERENTIAL     Status: Abnormal   Collection Time    04/08/13  6:10 PM      Result Value Range   WBC 18.6 (*) 4.0 - 10.5 K/uL   RBC 4.66  4.22 - 5.81 MIL/uL   Hemoglobin 16.4  13.0 - 17.0 g/dL   HCT 16.1  09.6 - 04.5 %   MCV 100.4 (*) 78.0 - 100.0 fL   MCH 35.2 (*) 26.0 - 34.0 pg   MCHC 35.0  30.0 - 36.0 g/dL   RDW 40.9  81.1 - 91.4 %   Platelets 151  150 - 400 K/uL   Neutrophils Relative 85 (*) 43 - 77 %   Neutro Abs 15.9 (*) 1.7 - 7.7 K/uL   Lymphocytes Relative 5 (*) 12 - 46 %   Lymphs Abs 0.9  0.7 - 4.0 K/uL   Monocytes Relative 10  3 - 12 %   Monocytes Absolute 1.9 (*) 0.1 - 1.0 K/uL   Eosinophils Relative 0  0 - 5 %   Eosinophils Absolute 0.0  0.0 - 0.7 K/uL   Basophils Relative 0  0 - 1 %   Basophils Absolute 0.0  0.0 - 0.1 K/uL  COMPREHENSIVE METABOLIC PANEL     Status: Abnormal   Collection Time    04/08/13  6:10 PM      Result Value Range   Sodium 134 (*) 135 - 145 mEq/L   Potassium 3.6  3.5 - 5.1 mEq/L   Chloride 96  96 - 112 mEq/L   CO2 25  19 - 32 mEq/L   Glucose, Bld 174 (*) 70  - 99 mg/dL   BUN 19  6 - 23 mg/dL   Creatinine, Ser 7.82  0.50 - 1.35 mg/dL   Calcium 9.3  8.4 - 95.6 mg/dL   Total Protein 7.0  6.0 - 8.3 g/dL   Albumin 3.5  3.5 - 5.2 g/dL   AST 16  0 - 37 U/L   ALT 14  0 - 53 U/L   Alkaline Phosphatase 87  39 - 117 U/L   Total Bilirubin 1.2  0.3 - 1.2 mg/dL   GFR calc non Af Amer 57 (*) >90 mL/min   GFR  calc Af Amer 66 (*) >90 mL/min   Comment:            The eGFR has been calculated     using the CKD EPI equation.     This calculation has not been     validated in all clinical     situations.     eGFR's persistently     <90 mL/min signify     possible Chronic Kidney Disease.  URINALYSIS, ROUTINE W REFLEX MICROSCOPIC     Status: Abnormal   Collection Time    04/08/13  6:15 PM      Result Value Range   Color, Urine YELLOW  YELLOW   APPearance HAZY (*) CLEAR   Specific Gravity, Urine 1.020  1.005 - 1.030   pH 6.0  5.0 - 8.0   Glucose, UA NEGATIVE  NEGATIVE mg/dL   Hgb urine dipstick SMALL (*) NEGATIVE   Bilirubin Urine NEGATIVE  NEGATIVE   Ketones, ur TRACE (*) NEGATIVE mg/dL   Protein, ur 30 (*) NEGATIVE mg/dL   Urobilinogen, UA 0.2  0.0 - 1.0 mg/dL   Nitrite NEGATIVE  NEGATIVE   Leukocytes, UA SMALL (*) NEGATIVE  URINE MICROSCOPIC-ADD ON     Status: Abnormal   Collection Time    04/08/13  6:15 PM      Result Value Range   Squamous Epithelial / LPF RARE  RARE   WBC, UA TOO NUMEROUS TO COUNT  <3 WBC/hpf   RBC / HPF 3-6  <3 RBC/hpf   Bacteria, UA MANY (*) RARE    Radiology Reports: Dg Chest 1 View  04/08/2013  *RADIOLOGY REPORT*  Clinical Data: Severe dizziness.  Ex-smoker.  CHEST - 1 VIEW  Comparison: 03/21/2012.  Findings: Normal sized heart.  Linear density at the medial left lung base.  Progressive focal pleural density at the left lateral lung base.  Clear right lung.  Diffuse osteopenia.  Right upper quadrant abdominal surgical clips.  Mild to moderate scoliosis.  IMPRESSION: Linear atelectasis or scarring at the left lung base  with a small amount of left lateral pleural thickening or loculated fluid.   Original Report Authenticated By: Beckie Salts, M.D.    Ct Head Wo Contrast  04/08/2013  *RADIOLOGY REPORT*  Clinical Data:  79 year old male with dizziness, fall with neck pain and headache.  CT HEAD WITHOUT CONTRAST CT CERVICAL SPINE WITHOUT CONTRAST  Technique:  Multidetector CT imaging of the head and cervical spine was performed following the standard protocol without intravenous contrast.  Multiplanar CT image reconstructions of the cervical spine were also generated.  Comparison:  None  CT HEAD  Findings: A small remote right cerebellar infarct is present. No acute intracranial abnormalities are identified, including mass lesion or mass effect, hydrocephalus, extra-axial fluid collection, midline shift, hemorrhage, or acute infarction.  The visualized bony calvarium is unremarkable.  IMPRESSION: No evidence of acute intracranial abnormality.  Small remote right cerebellar infarct.  CT CERVICAL SPINE  Findings: Normal alignment is noted. There is no evidence of fracture, subluxation or prevertebral soft tissue swelling. Moderate to severe degenerative disc disease and spondylosis from C4-C7 noted with partial fusion of C5-C6.  Facet arthropathy is also identified.  These degenerative changes contribute to mild biforaminal narrowing from C4-C7. No focal bony lesions are identified.  IMPRESSION: No static evidence of acute injury to the cervical spine.  Moderate to severe degenerative disc disease/spondylosis from C4- C7.   Original Report Authenticated By: Harmon Pier, M.D.    Ct Cervical Spine  Wo Contrast  04/08/2013  *RADIOLOGY REPORT*  Clinical Data:  79 year old male with dizziness, fall with neck pain and headache.  CT HEAD WITHOUT CONTRAST CT CERVICAL SPINE WITHOUT CONTRAST  Technique:  Multidetector CT imaging of the head and cervical spine was performed following the standard protocol without intravenous contrast.   Multiplanar CT image reconstructions of the cervical spine were also generated.  Comparison:  None  CT HEAD  Findings: A small remote right cerebellar infarct is present. No acute intracranial abnormalities are identified, including mass lesion or mass effect, hydrocephalus, extra-axial fluid collection, midline shift, hemorrhage, or acute infarction.  The visualized bony calvarium is unremarkable.  IMPRESSION: No evidence of acute intracranial abnormality.  Small remote right cerebellar infarct.  CT CERVICAL SPINE  Findings: Normal alignment is noted. There is no evidence of fracture, subluxation or prevertebral soft tissue swelling. Moderate to severe degenerative disc disease and spondylosis from C4-C7 noted with partial fusion of C5-C6.  Facet arthropathy is also identified.  These degenerative changes contribute to mild biforaminal narrowing from C4-C7. No focal bony lesions are identified.  IMPRESSION: No static evidence of acute injury to the cervical spine.  Moderate to severe degenerative disc disease/spondylosis from C4- C7.   Original Report Authenticated By: Harmon Pier, M.D.     Electrocardiogram: Sinus tachycardia 103 beats per minute. Right bundle branch block is noted. Indeterminate axis. Interventricular conduction delay is seen. Other nonspecific changes are noted. No older EKGs available for comparison. Abnormal EKG.  Problem List  Principal Problem:   UTI (lower urinary tract infection) Active Problems:   Near syncope   Dehydration   History of vertigo   Assessment: This is 79 year old, Caucasian male, who presents with dizziness, was found to have a fever. His UA was abnormal, suggesting a urinary tract infection. It's possible he may have been orthostatic. Orthostatic vital signs were not checked. He has abnormal EKG, however, denies any chest pains. There was also a mention of left-sided pleural thickening, which will need to be followed up  Plan: #1 dizziness: Most likely due  to dehydration. Could have also been vertigo, though less likely. No neurological deficits are present. Orthostatics will need to be checked. Continue with IV fluids. Physical and occupational therapy will be asked to the patient.  #2 fever with abnormal, UA and leukocytosis: Appears that he has a urinary tract infection. Urine cultures will be followed up on. He'll be continued on ceftriaxone.  #3 hyperglycemia: This is a nonfasting level. Fasting level will be checked in the morning.  #4 abnormal EKG: No older EKGs available in our system. We'll repeat EKG in the morning and check cardiac enzymes. He denies any chest pain whatsoever. Aspirin will be provided for now.  #5 history of vertigo: Continue with meclizine as needed..  #6 unspecified prostate disease: Followup with PCP for further management. He is making urine at this time.  #7 abnormal chest x-ray: Most likely scarring. Further management deferred to PCP. He does not have any shortness of breath, nor is he hypoxic.  #8 nicotine abuse: Continue with nicotine gum.   DVT Prophylaxis: Lovenox Code Status: Full code Family Communication: Discussed with the patient and his daughter  Disposition Plan: Will likely return home when improved   Further management decisions will depend on results of further testing and patient's response to treatment.  [facility]  [facility] Pager [phone]  If 7PM-7AM, please contact night-coverage [email or web address] Password [facility]  04/08/2013, 9:58 PM

## 2013-04-08 NOTE — ED Notes (Signed)
Family brought patient something to eat as requested and approved by MD.

## 2013-04-09 DIAGNOSIS — N390 Urinary tract infection, site not specified: Secondary | ICD-10-CM | POA: Diagnosis not present

## 2013-04-09 DIAGNOSIS — E860 Dehydration: Secondary | ICD-10-CM | POA: Diagnosis not present

## 2013-04-09 LAB — COMPREHENSIVE METABOLIC PANEL
ALT: 15 U/L (ref 0–53)
AST: 38 U/L — ABNORMAL HIGH (ref 0–37)
CO2: 27 mEq/L (ref 19–32)
Chloride: 102 mEq/L (ref 96–112)
GFR calc Af Amer: 74 mL/min — ABNORMAL LOW (ref >90)
GFR calc non Af Amer: 63 mL/min — ABNORMAL LOW (ref >90)
Glucose, Bld: 118 mg/dL — ABNORMAL HIGH (ref 70–99)
Sodium: 137 mEq/L (ref 135–145)
Total Bilirubin: 0.9 mg/dL (ref 0.3–1.2)

## 2013-04-09 LAB — CBC
MCH: 34.8 pg — ABNORMAL HIGH (ref 26.0–34.0)
MCHC: 34.6 g/dL (ref 30.0–36.0)
Platelets: 137 10*3/uL — ABNORMAL LOW (ref 150–400)
RDW: 12.4 % (ref 11.5–15.5)

## 2013-04-09 LAB — TROPONIN I: Troponin I: <0.3 ng/mL (ref <0.30)

## 2013-04-09 NOTE — Progress Notes (Signed)
[facility] PROGRESS NOTE  Chase Chase ZOX:[id number] DOB: Jan 26, 1922 DOA: 04/08/2013 PCP: Kirk Ruths, MD  Assessment/Plan: 1 dizziness: Most likely due to dehydration. Could have also been vertigo, though less likely. No neurological deficits are present. Improved this am. Pt not orthostatic. Taking po fluids well. Will decrease IV rate. Physical therapy recommending OP PT.    #2 fever with abnormal, UA and leukocytosis: Appears that he has a urinary tract infection. Urine cultures pending. Max temp 101.9 early am.  Non-toxic appearing.WC trending downward. Continue ceftriaxone day #2.   #3 hyperglycemia: Fasting level 118.   #4 abnormal EKG: No older EKGs available in our system. Repeat EKG pending. Cardiac enzymes neg to date. Continue to denies any chest pain whatsoever. Aspirin.   #5 history of vertigo: Continue with meclizine as needed. See #1   #6 unspecified prostate disease: Followup with PCP for further management. He is making urine at this time.   #7 abnormal chest x-ray: Most likely scarring. Further management deferred to PCP. He does not have any shortness of breath, nor is he hypoxic.   #8 nicotine abuse: Continue with nicotine gum.    Code Status: full Family Communication:  None available Disposition Plan: home likely tomorrow. Pt lives alone. Will request OP PT.   Consultants:  none  Procedures:  none  Antibiotics:  Rocephin 04/08/13>>>  HPI/Subjective: Up in chair. Reports feeling much better. Walked with PT without difficulty  Objective: Filed Vitals:   04/09/13 0224 04/09/13 0225 04/09/13 0436 04/09/13 0659  BP: 158/72 186/82 100/57   Pulse: 101 103 85   Temp: 98.8 F (37.1 C)  101.9 F (38.8 C) 97.9 F (36.6 C)  TempSrc: Oral  Oral Oral  Resp: 20 20 20    Height:      Weight:      SpO2: 95% 95% 91%     Intake/Output Summary (Last 24 hours) at 04/09/13 1006 Last data filed at 04/09/13 0552  Gross per 24 hour  Intake       0 ml  Output    200 ml  Net   -200 ml   Filed Weights   04/08/13 2229  Weight: 74.844 kg (165 lb)    Exam:   General:  Alert well nourished   Cardiovascular: RRR No MGR trace LE edema  Respiratory: normal effort BSCTAB no wheeze no rhonchi  Abdomen: soft, non-distended. +BS non-tender to palpation  Musculoskeletal: no clubbing no cyanosis   Data Reviewed: Basic Metabolic Panel:  Recent Labs Lab 04/08/13 1810 04/09/13 0538  NA 134* 137  K 3.6 3.7  CL 96 102  CO2 25 27  GLUCOSE 174* 118*  BUN 19 22  CREATININE 1.11 1.02  CALCIUM 9.3 8.6   Liver Function Tests:  Recent Labs Lab 04/08/13 1810 04/09/13 0538  AST 16 38*  ALT 14 15  ALKPHOS 87 70  BILITOT 1.2 0.9  PROT 7.0 6.0  ALBUMIN 3.5 2.9*   No results found for this basename: LIPASE, AMYLASE,  in the last 168 hours No results found for this basename: AMMONIA,  in the last 168 hours CBC:  Recent Labs Lab 04/08/13 1810 04/09/13 0538  WBC 18.6* 16.8*  NEUTROABS 15.9*  --   HGB 16.4 14.9  HCT 46.8 43.1  MCV 100.4* 100.7*  PLT 151 137*   Cardiac Enzymes:  Recent Labs Lab 04/08/13 2149 04/08/13 2303 04/09/13 0538  TROPONINI <0.30 <0.30 <0.30   BNP (last 3 results) No results found for this basename: PROBNP,  in the last 8760 hours CBG: No results found for this basename: GLUCAP,  in the last 168 hours  No results found for this or any previous visit (from the past 240 hour(s)).   Studies: Dg Chest 1 View  04/08/2013  *RADIOLOGY REPORT*  Clinical Data: Severe dizziness.  Ex-smoker.  CHEST - 1 VIEW  Comparison: 03/21/2012.  Findings: Normal sized heart.  Linear density at the medial left lung base.  Progressive focal pleural density at the left lateral lung base.  Clear right lung.  Diffuse osteopenia.  Right upper quadrant abdominal surgical clips.  Mild to moderate scoliosis.  IMPRESSION: Linear atelectasis or scarring at the left lung base with a small amount of left lateral pleural  thickening or loculated fluid.   Original Report Authenticated By: Beckie Salts, M.D.    Ct Head Wo Contrast  04/08/2013  *RADIOLOGY REPORT*  Clinical Data:  79 year old male with dizziness, fall with neck pain and headache.  CT HEAD WITHOUT CONTRAST CT CERVICAL SPINE WITHOUT CONTRAST  Technique:  Multidetector CT imaging of the head and cervical spine was performed following the standard protocol without intravenous contrast.  Multiplanar CT image reconstructions of the cervical spine were also generated.  Comparison:  None  CT HEAD  Findings: A small remote right cerebellar infarct is present. No acute intracranial abnormalities are identified, including mass lesion or mass effect, hydrocephalus, extra-axial fluid collection, midline shift, hemorrhage, or acute infarction.  The visualized bony calvarium is unremarkable.  IMPRESSION: No evidence of acute intracranial abnormality.  Small remote right cerebellar infarct.  CT CERVICAL SPINE  Findings: Normal alignment is noted. There is no evidence of fracture, subluxation or prevertebral soft tissue swelling. Moderate to severe degenerative disc disease and spondylosis from C4-C7 noted with partial fusion of C5-C6.  Facet arthropathy is also identified.  These degenerative changes contribute to mild biforaminal narrowing from C4-C7. No focal bony lesions are identified.  IMPRESSION: No static evidence of acute injury to the cervical spine.  Moderate to severe degenerative disc disease/spondylosis from C4- C7.   Original Report Authenticated By: Harmon Pier, M.D.    Ct Cervical Spine Wo Contrast  04/08/2013  *RADIOLOGY REPORT*  Clinical Data:  79 year old male with dizziness, fall with neck pain and headache.  CT HEAD WITHOUT CONTRAST CT CERVICAL SPINE WITHOUT CONTRAST  Technique:  Multidetector CT imaging of the head and cervical spine was performed following the standard protocol without intravenous contrast.  Multiplanar CT image reconstructions of the cervical  spine were also generated.  Comparison:  None  CT HEAD  Findings: A small remote right cerebellar infarct is present. No acute intracranial abnormalities are identified, including mass lesion or mass effect, hydrocephalus, extra-axial fluid collection, midline shift, hemorrhage, or acute infarction.  The visualized bony calvarium is unremarkable.  IMPRESSION: No evidence of acute intracranial abnormality.  Small remote right cerebellar infarct.  CT CERVICAL SPINE  Findings: Normal alignment is noted. There is no evidence of fracture, subluxation or prevertebral soft tissue swelling. Moderate to severe degenerative disc disease and spondylosis from C4-C7 noted with partial fusion of C5-C6.  Facet arthropathy is also identified.  These degenerative changes contribute to mild biforaminal narrowing from C4-C7. No focal bony lesions are identified.  IMPRESSION: No static evidence of acute injury to the cervical spine.  Moderate to severe degenerative disc disease/spondylosis from C4- C7.   Original Report Authenticated By: Harmon Pier, M.D.     Scheduled Meds: . cefTRIAXone (ROCEPHIN)  IV  1 g Intravenous Q24H  .  enoxaparin (LOVENOX) injection  40 mg Subcutaneous Q24H  . simvastatin  10 mg Oral q1800  . sodium chloride  3 mL Intravenous Q12H   Continuous Infusions: . sodium chloride 100 mL/hr at 04/08/13 2315    Principal Problem:   UTI (lower urinary tract infection) Active Problems:   Near syncope   Dehydration   History of vertigo   Abnormal EKG    Time spent: 30 minutes    [facility]  [facility]  If 7PM-7AM, please contact night-coverage at [email or web address], password [facility] 04/09/2013, 10:06 AM  LOS: 1 day   Attending: Patient seen and examined. Above note reviewed. Agree with treatment of probable UTI and dehydration. Should be able to go home tomorrow providing he continues to do well.

## 2013-04-09 NOTE — Evaluation (Addendum)
Physical Therapy Evaluation Patient Details Name: Chase Chase MRN: [id number] DOB: August 24, 1922 Today's Date: 04/09/2013 Time: 0820-0855 PT Time Calculation (min): 35 min  PT Assessment / Plan / Recommendation Clinical Impression  Pt is an 79 yo male with chronic dizziness, neck pain and weakness in the right leg who will benefit from skilled PT to improve safety of ambulation, strength and ROM of cervical spine.      PT Assessment  Patient needs continued PT services    Follow Up Recommendations  Outpatient PT    Does the patient have the potential to tolerate intense rehabilitation    N/A  Barriers to Discharge  none      Equipment Recommendations    none   Recommendations for Other Services   none  Frequency Min 3X/week    Precautions / Restrictions Precautions Precautions: Fall Restrictions Weight Bearing Restrictions: No   Pertinent Vitals/Pain 8/10      Mobility  Bed Mobility Bed Mobility: Supine to Sit Supine to Sit: 5: Supervision Transfers Transfers: Sit to Stand;Stand to Sit Sit to Stand: 5: Supervision Stand to Sit: 6: Modified independent (Device/Increase time) Ambulation/Gait Ambulation/Gait Assistance: 6: Modified independent (Device/Increase time) Ambulation Distance (Feet): 75 Feet Assistive device: None Ambulation/Gait Assistance Details: Pt tends to drag his R LE  when verbally cued he will pick it up.  PT is not intereseted in using a cane at this time. Gait velocity: slow    Exercises Other Exercises Other Exercises: cervical retaction, SB and rotation x 5; LAQ x 5; functional sqatting x 10   PT Diagnosis: Generalized weakness;Acute pain  PT Problem List: Decreased strength;Decreased range of motion;Decreased activity tolerance;Pain PT Treatment Interventions: Gait training;Therapeutic exercise;Therapeutic activities   PT Goals Acute Rehab PT Goals PT Goal Formulation: With patient Time For Goal Achievement: 04/11/13 Pt will go  Supine/Side to Sit: with modified independence PT Goal: Supine/Side to Sit - Progress: Goal set today Pt will go Sit to Stand: with modified independence PT Goal: Sit to Stand - Progress: Goal set today Pt will Ambulate: >150 feet PT Goal: Ambulate - Progress: Goal set today Pt will Go Up / Down Stairs: 1-2 stairs PT Goal: Up/Down Stairs - Progress: Goal set today  Visit Information  Last PT Received On: 04/09/13    Subjective Data  Subjective: Pt states he has been progressively becoming weaker.  He also has had difficult with balance for a long time which has increased in severity Patient Stated Goal: Start working out at [employer] again   Prior [employer] Living Lives With: Alone Type of Home: House Home Access: Stairs to enter Secretary/administrator of Steps: 2 Entrance Stairs-Rails: Right Bathroom Toilet: Standard Home Adaptive Equipment: None Prior Function Able to Take Stairs?: Yes Driving: Yes Vocation: Retired Musician: No difficulties Dominant Hand: Right    Cognition  Cognition Arousal/Alertness: Awake/alert Overall Cognitive Status: Within Functional Limits for tasks assessed    Extremity/Trunk Assessment Right Lower Extremity Assessment RLE ROM/Strength/Tone: [facility] for tasks assessed Left Lower Extremity Assessment LLE ROM/Strength/Tone: [facility] for tasks assessed   Balance    End of Session PT - End of Session Activity Tolerance: Patient tolerated treatment well Patient left: in chair;with call bell/phone within reach  GP     RUSSELL,CINDY 04/09/2013, 9:10 AM

## 2013-04-09 NOTE — Care Management Note (Unsigned)
    Page 1 of 1   04/09/2013     2:48:08 PM   CARE MANAGEMENT NOTE 04/09/2013  Patient:  Chase Chase, Chase Chase   Account Number:  [id number]  Date Initiated:  04/09/2013  Documentation initiated by:  Rosemary Holms  Subjective/Objective Assessment:   Pt admitted from home with a UTI. PT recommended outpt pt and pt agreed that he "would try it a couple of times".     Action/Plan:   Anticipated DC Date:  04/10/2013   Anticipated DC Plan:  HOME/SELF CARE      DC Planning Services  CM consult      Choice offered to / List presented to:             Status of service:  In process, will continue to follow Medicare Important Message given?   (If response is "NO", the following Medicare IM given date fields will be blank) Date Medicare IM given:   Date Additional Medicare IM given:    Discharge Disposition:    Per UR Regulation:    If discussed at Long Length of Stay Meetings, dates discussed:    Comments:  04/09/13 Rosemary Holms RN BSN CM

## 2013-04-09 NOTE — Progress Notes (Signed)
Occupational Therapy Screen  OT orders received. Patient chart reviewed. Spoke with PT who had just finished evaluating Chase Chase. Patient is functioning at baseline for BADL performance. Patient does not require OT services at this time; will sign off.   Chase Chase, OTR/L,CBIS  04/09/13 9:52AM

## 2013-04-10 DIAGNOSIS — R9431 Abnormal electrocardiogram [ECG] [EKG]: Secondary | ICD-10-CM | POA: Diagnosis not present

## 2013-04-10 DIAGNOSIS — E860 Dehydration: Secondary | ICD-10-CM | POA: Diagnosis not present

## 2013-04-10 DIAGNOSIS — R55 Syncope and collapse: Secondary | ICD-10-CM | POA: Diagnosis not present

## 2013-04-10 DIAGNOSIS — N390 Urinary tract infection, site not specified: Secondary | ICD-10-CM | POA: Diagnosis not present

## 2013-04-10 LAB — URINE CULTURE

## 2013-04-10 MED ORDER — CEFUROXIME AXETIL 500 MG PO TABS: 500.0000 mg | ORAL_TABLET | Freq: Two times a day (BID) | ORAL | Status: DC

## 2013-04-10 NOTE — Discharge Summary (Signed)
Physician Discharge Summary  Chase Chase ZOX:[id number] DOB: [birth year] DOA: 04/08/2013  PCP: Kirk Ruths, MD  Admit date: 04/08/2013 Discharge date: 04/10/2013  Time spent: 40 minutes  Recommendations for Outpatient Follow-up:  1. Follow up with PCP 1 week for evaluation of symptoms.  2. PT recommending outpatient PT. Rehab center will call to set up. 3. Recommend following glucose level outpatient  Discharge Diagnoses:  Principal Problem:   UTI (lower urinary tract infection) Active Problems:   Near syncope   Dehydration   History of vertigo   Abnormal EKG   Discharge Condition: stable  Diet recommendation: regular  Filed Weights   04/08/13 2229  Weight: 74.844 kg (165 lb)    History of present illness:  Chase Chase is a 79 y.o. male with a past medical history of hypercholesterolemia, and prostate problems in the past, who also has vertigo. He was in his usual state of health until 04/08/13 when he felt extremely dizzy when he stood up at home. He then fell down to the floor. However, denied any loss of consciousness. No head injuries. He called his daughter. EMS was called and he was brought into the hospital. He was on the floor for about 30 minutes. He does have a history of vertigo. Unfortunately he was using expired meclizine.He reported that the episode felt more as if his head was swimming rather than a true vertiginous spell. No nausea, vomiting, no diarrhea recently. He's had frequent urinations, but denied any burning sensation. He's also noticed decrease in the amount of urine. He also had fever the night prior to presentation and was found to be febrile in the emergency department. He denied any chest pain or shortness of breath. No headaches. No focal deficits. He did complain of some soreness in the right side of the neck, which has been ongoing for one year. At time of exam in ED he reported no longer feeling dizzy while he is sitting down on the bed. Work up  yields UTI and dehydration. [facility] asked to admit  Hospital Course:  #1 dizziness: Most likely due to dehydration. Could have also been vertigo, though less likely. No neurological deficits  Pt not orthostatic. Given IV fluids and antibiotics and quickly improved. By day #2 taking po fluids well. Physical therapy recommending OP PT. At time of discharge pt denies dizziness with ambulation/position change. Recommend follow up with PCP 1 week for evaluation of symptoms.   #2 fever with abnormal, UA and leukocytosis related to UTI. Urine cultures yield prteus mirabilis. Rocephin started. Max temp 101.9 .  Pt remained non-toxic appearing during hospitalization.WC trending downward at discharge. At discharge afebrile. Will discharge with 3 days ceftin to complete 5 day course.    #3 hyperglycemia: Fasting level 118. May benefit from outpatient follow up.  #4 abnormal EKG: No older EKGs available in our system. Repeat EKG with NSR age indeterminate infarct. Cardiac enzymes neg. No chest pain during this hospitalization. Recommend outpatient follow up.   #5 history of vertigo: Continue with meclizine as needed. See #1   #6 unspecified prostate disease: Followup with PCP for further management. Stable at baseline   #7 abnormal chest x-ray: Most likely scarring. Further management deferred to PCP. He does not have any shortness of breath, nor is he hypoxic.   #8 nicotine abuse: Continue with nicotine gum.   Procedures:  none  Consultations:  none  Discharge Exam: Filed Vitals:   04/09/13 0659 04/09/13 1318 04/09/13 2056 04/10/13 0546  BP:  107/64 105/64 98/60  Pulse:  62 57 59  Temp: 97.9 F (36.6 C) 98 F (36.7 C) 98.5 F (36.9 C) 98.1 F (36.7 C)  TempSrc: Oral  Oral Oral  Resp:  18 20 18   Height:      Weight:      SpO2:  93% 91% 93%    General: awake alert NAD Cardiovascular: RRR No MGR No LE edema Respiratory: normal effort BS clear bilaterally. No wheeze no rhonchi Abdomen:  soft +BS non-tender to palpation Musculoskeletal: no clubbing no cyanosis  Discharge Instructions      Discharge Orders   Future Orders Complete By Expires     Call MD for:  persistant nausea and vomiting  As directed     Call MD for:  temperature >100.4  As directed     Discharge instructions  As directed     Comments:      Take antibiotics as directed until complete. Minimize caffeine.    Increase activity slowly  As directed         Medication List    TAKE these medications       acetaminophen 500 MG tablet  Commonly known as:  TYLENOL  Take 500 mg by mouth every 6 (six) hours as needed.     cefUROXime 500 MG tablet  Commonly known as:  CEFTIN  Take 1 tablet (500 mg total) by mouth 2 (two) times daily.     lovastatin 20 MG tablet  Commonly known as:  MEVACOR  Take 1 tablet by mouth daily with supper.     meclizine 25 MG tablet  Commonly known as:  ANTIVERT  Take 25 mg by mouth 3 (three) times daily as needed for dizziness.     nicotine polacrilex 4 MG gum  Commonly known as:  NICORETTE  Take 4 mg by mouth as needed for smoking cessation.       Follow-up Information   Follow up with Kirk Ruths, MD. Schedule an appointment as soon as possible for a visit in 1 week. (See in 1 week for follow up of symptoms)    Contact information:   [address] [place] [address] [phone]        The results of significant diagnostics from this hospitalization (including imaging, microbiology, ancillary and laboratory) are listed below for reference.    Significant Diagnostic Studies: Dg Chest 1 View  04/08/2013  *RADIOLOGY REPORT*  Clinical Data: Severe dizziness.  Ex-smoker.  CHEST - 1 VIEW  Comparison: 03/21/2012.  Findings: Normal sized heart.  Linear density at the medial left lung base.  Progressive focal pleural density at the left lateral lung base.  Clear right lung.  Diffuse osteopenia.  Right upper quadrant abdominal  surgical clips.  Mild to moderate scoliosis.  IMPRESSION: Linear atelectasis or scarring at the left lung base with a small amount of left lateral pleural thickening or loculated fluid.   Original Report Authenticated By: Beckie Salts, M.D.    Ct Head Wo Contrast  04/08/2013  *RADIOLOGY REPORT*  Clinical Data:  79 year old male with dizziness, fall with neck pain and headache.  CT HEAD WITHOUT CONTRAST CT CERVICAL SPINE WITHOUT CONTRAST  Technique:  Multidetector CT imaging of the head and cervical spine was performed following the standard protocol without intravenous contrast.  Multiplanar CT image reconstructions of the cervical spine were also generated.  Comparison:  None  CT HEAD  Findings: A small remote right cerebellar infarct is present. No acute  intracranial abnormalities are identified, including mass lesion or mass effect, hydrocephalus, extra-axial fluid collection, midline shift, hemorrhage, or acute infarction.  The visualized bony calvarium is unremarkable.  IMPRESSION: No evidence of acute intracranial abnormality.  Small remote right cerebellar infarct.  CT CERVICAL SPINE  Findings: Normal alignment is noted. There is no evidence of fracture, subluxation or prevertebral soft tissue swelling. Moderate to severe degenerative disc disease and spondylosis from C4-C7 noted with partial fusion of C5-C6.  Facet arthropathy is also identified.  These degenerative changes contribute to mild biforaminal narrowing from C4-C7. No focal bony lesions are identified.  IMPRESSION: No static evidence of acute injury to the cervical spine.  Moderate to severe degenerative disc disease/spondylosis from C4- C7.   Original Report Authenticated By: Harmon Pier, M.D.    Ct Cervical Spine Wo Contrast  04/08/2013  *RADIOLOGY REPORT*  Clinical Data:  79 year old male with dizziness, fall with neck pain and headache.  CT HEAD WITHOUT CONTRAST CT CERVICAL SPINE WITHOUT CONTRAST  Technique:  Multidetector CT imaging of  the head and cervical spine was performed following the standard protocol without intravenous contrast.  Multiplanar CT image reconstructions of the cervical spine were also generated.  Comparison:  None  CT HEAD  Findings: A small remote right cerebellar infarct is present. No acute intracranial abnormalities are identified, including mass lesion or mass effect, hydrocephalus, extra-axial fluid collection, midline shift, hemorrhage, or acute infarction.  The visualized bony calvarium is unremarkable.  IMPRESSION: No evidence of acute intracranial abnormality.  Small remote right cerebellar infarct.  CT CERVICAL SPINE  Findings: Normal alignment is noted. There is no evidence of fracture, subluxation or prevertebral soft tissue swelling. Moderate to severe degenerative disc disease and spondylosis from C4-C7 noted with partial fusion of C5-C6.  Facet arthropathy is also identified.  These degenerative changes contribute to mild biforaminal narrowing from C4-C7. No focal bony lesions are identified.  IMPRESSION: No static evidence of acute injury to the cervical spine.  Moderate to severe degenerative disc disease/spondylosis from C4- C7.   Original Report Authenticated By: Harmon Pier, M.D.     Microbiology: Recent Results (from the past 240 hour(s))  URINE CULTURE     Status: None   Collection Time    04/08/13  6:15 PM      Result Value Range Status   Specimen Description URINE, CLEAN CATCH   Final   Special Requests NONE   Final   Culture  Setup Time 04/09/2013 02:13   Final   Colony Count 20,OOO COLONIES/ML   Final   Culture PROTEUS MIRABILIS   Final   Report Status PENDING   Incomplete     Labs: Basic Metabolic Panel:  Recent Labs Lab 04/08/13 1810 04/09/13 0538  NA 134* 137  K 3.6 3.7  CL 96 102  CO2 25 27  GLUCOSE 174* 118*  BUN 19 22  CREATININE 1.11 1.02  CALCIUM 9.3 8.6   Liver Function Tests:  Recent Labs Lab 04/08/13 1810 04/09/13 0538  AST 16 38*  ALT 14 15   ALKPHOS 87 70  BILITOT 1.2 0.9  PROT 7.0 6.0  ALBUMIN 3.5 2.9*   No results found for this basename: LIPASE, AMYLASE,  in the last 168 hours No results found for this basename: AMMONIA,  in the last 168 hours CBC:  Recent Labs Lab 04/08/13 1810 04/09/13 0538  WBC 18.6* 16.8*  NEUTROABS 15.9*  --   HGB 16.4 14.9  HCT 46.8 43.1  MCV 100.4* 100.7*  PLT 151  137*   Cardiac Enzymes:  Recent Labs Lab 04/08/13 2149 04/08/13 2303 04/09/13 0538 04/09/13 1050  TROPONINI <0.30 <0.30 <0.30 <0.30   BNP: BNP (last 3 results) No results found for this basename: PROBNP,  in the last 8760 hours CBG: No results found for this basename: GLUCAP,  in the last 168 hours     Signed:  Gwenyth Bender  [facility] 04/10/2013, 9:39 AM  Seen and agree. Crista Curb, M.D.

## 2013-04-10 NOTE — Progress Notes (Signed)
Patient states understanding of discharge instructions, prescriptions given 

## 2013-04-17 DIAGNOSIS — Z6827 Body mass index (BMI) 27.0-27.9, adult: Secondary | ICD-10-CM | POA: Diagnosis not present

## 2013-04-17 DIAGNOSIS — N390 Urinary tract infection, site not specified: Secondary | ICD-10-CM | POA: Diagnosis not present

## 2013-04-24 ENCOUNTER — Ambulatory Visit (HOSPITAL_COMMUNITY)
Admission: RE | Admit: 2013-04-24 | Discharge: 2013-04-24 | Disposition: A | Discharge status: Home / Self Care | Payer: Medicare Other | Source: Ambulatory Visit | Attending: Internal Medicine | Admitting: Internal Medicine

## 2013-04-24 DIAGNOSIS — M542 Cervicalgia: Secondary | ICD-10-CM | POA: Insufficient documentation

## 2013-04-24 DIAGNOSIS — IMO0001 Reserved for inherently not codable concepts without codable children: Secondary | ICD-10-CM | POA: Insufficient documentation

## 2013-04-24 DIAGNOSIS — R293 Abnormal posture: Secondary | ICD-10-CM | POA: Insufficient documentation

## 2013-04-24 DIAGNOSIS — M549 Dorsalgia, unspecified: Secondary | ICD-10-CM | POA: Insufficient documentation

## 2013-04-24 DIAGNOSIS — M436 Torticollis: Secondary | ICD-10-CM | POA: Insufficient documentation

## 2013-04-24 NOTE — Evaluation (Cosign Needed)
Physical Therapy Evaluation  Patient Details  Name: Chase Chase MRN: [id number] Date of Birth: February 09, 1922 Charge eval Today's Date: 04/24/2013 Time: 1345-1440 PT Time Calculation (min): 55 min              Visit#: 1 of 8  Re-eval: 05/24/13 Assessment Diagnosis: cervical pain; R LE weakness  Prior Therapy: none  Authorization: Medicare      Authorization Visit#: 1 of 8   Past Medical History:  Past Medical History  Diagnosis Date  . Hypercholesterolemia   . Glaucoma(365)   . Prostate disease     unspecified   Past Surgical History:  Past Surgical History  Procedure Laterality Date  . Cholecystectomy    . Hernia repair      bilateral    Subjective Symptoms/Limitations Symptoms: Chase Chase states that he was hospitalized from 04/08/2013 to 5/1/204 due syncope.  He states that he has noticed increased weakness in both of his legs and that his balance is decreasing.  He is dragging his foot when he walks and stumbles quite a bit but has not fallen.    His main concer at this time, however, is  his neck pain. He has been having R sided neck pain for years.  He has been having a lot of difficulty movig his neck.   How long can you sit comfortably?: sitting is alright How long can you stand comfortably?: ten minutes  How long can you walk comfortably?: Longest he has walked for is 5-10 minutes. Pain Assessment Pain Score:   8 Pain Location: Neck Pain Orientation: Right  Balance Screening  no falls  Sensation/Coordination/Flexibility/Functional Tests Functional Tests Functional Tests: neck disablitity  16/50  Assessment RLE Strength Right Hip Flexion: 3+/5 Right Hip Extension: 3+/5 Right Hip ABduction: 3+/5 Right Knee Flexion: 5/5 Right Knee Extension: 3+/5 Right Ankle Dorsiflexion: 4/5 LLE Strength Left Hip Flexion: 5/5 Left Hip Extension: 4/5 Left Hip ABduction: 5/5 Left Knee Extension: 5/5 Left Ankle Dorsiflexion: 5/5 Cervical AROM Cervical Flexion:  wnl Cervical Extension: wnl Cervical - Right Side Bend: decreased20% Cervical - Left Side Bend: decreased 30% Cervical - Right Rotation: decreased 60% Cervical - Left Rotation: decreased 30% Cervical Strength Cervical Extension: 5/5 Cervical - Right Side Bend: 4/5 Cervical - Left Side Bend: 4/5  Exercise/Treatments Mobility/Balance  Posture/Postural Control Posture/Postural Control: Postural limitations Postural Limitations: forward head, increased kyphosis, walks forward bent.  Seated Exercises Neck Retraction: 5 reps W Back: 10 reps Other Seated Exercise: Ankle Dorsi/plantarflexion; LAQ R LE x 10 Supine Exercises Other Supine Exercise: SLR , bridge x1 Sidelying Exercises Other Sidelying Exercise: R Abduction x 10  Manual Therapy Manual Therapy: Myofascial release Myofascial Release: To cervical mm as well as manual traction, suboccipital release and Jt mobs at end range of SB and rotation to improve motion and decrease pain  Physical Therapy Assessment and Plan PT Assessment and Plan Clinical Impression Statement: Chase Chase has been referred for chronic neck pain as well as decreased R LE pain.  Examination shows decreased mm strength, decreased ROM, and increased pain who will benefit from skilled therapy to maximize his functional potential and improve his safety. Rehab Potential: Good PT Frequency: Min 2X/week PT Duration: 4 weeks PT Plan: Pt would like to concentrate on his neck first.  Begin backward UBE, t-band for postural correction, neck stability exercises with B UE flexion progress to x to V and prone exercises; continue with manual,  For LE marching high, heel raises/toe raises.    Goals Home Exercise  Program Pt will Perform Home Exercise Program: Independently PT Short Term Goals Time to Complete Short Term Goals: 2 weeks PT Short Term Goal 1: Pt neck pain to be no greater than a 4/10 PT Short Term Goal 2: ROM improved by 20% PT Long Term Goals Time to  Complete Long Term Goals: 4 weeks PT Long Term Goal 1: Pt to be I in advance HEP PT Long Term Goal 2: PT to hold himself in an upright postion when walking Long Term Goal 3: Pt pain to be no greater than a 2/10 80% of the day in his neck. Long Term Goal 4: Pt to be able to drive with no extra pain PT Long Term Goal 5: ROM to be wfl Additional PT Long Term Goals?:  (Pt neck disability to be improved by 10 pointes )  Problem List Patient Active Problem List   Diagnosis Date Noted  . Neck stiffness 04/24/2013  . Postural imbalance 04/24/2013  . UTI (lower urinary tract infection) 04/08/2013  . Near syncope 04/08/2013  . Dehydration 04/08/2013  . History of vertigo 04/08/2013  . Abnormal EKG 04/08/2013  . Abnormal CXR (chest x-ray) 01/10/2012  . Bronchitis with flu 12/10/2011  . Glaucoma 12/10/2011    General Behavior During Therapy: [facility] for tasks assessed/performed PT Plan of Care PT Home Exercise Plan: given  GP Functional Assessment Tool Used: neck disability  Functional Limitation: Self care Self Care Current Status (Z6109): At least 20 percent but less than 40 percent impaired, limited or restricted Self Care Goal Status (U0454): At least 1 percent but less than 20 percent impaired, limited or restricted  Chase Chase,Chase Chase 04/24/2013, 3:14 PM  Physician Documentation Your signature is required to indicate approval of the treatment plan as stated above.  Please sign and either send electronically or make a copy of this report for your files and return this physician signed original.   Please mark one 1.__approve of plan  2. ___approve of plan with the following conditions.   ______________________________                                                          _____________________ Physician Signature                                                                                                             Date

## 2013-04-29 ENCOUNTER — Ambulatory Visit (HOSPITAL_COMMUNITY)
Admission: RE | Admit: 2013-04-29 | Discharge: 2013-04-29 | Disposition: A | Discharge status: Home / Self Care | Payer: Medicare Other | Source: Ambulatory Visit | Attending: Internal Medicine | Admitting: Internal Medicine

## 2013-04-29 DIAGNOSIS — IMO0001 Reserved for inherently not codable concepts without codable children: Secondary | ICD-10-CM | POA: Diagnosis not present

## 2013-04-29 DIAGNOSIS — M542 Cervicalgia: Secondary | ICD-10-CM | POA: Diagnosis not present

## 2013-04-29 DIAGNOSIS — M549 Dorsalgia, unspecified: Secondary | ICD-10-CM | POA: Diagnosis not present

## 2013-04-29 NOTE — Progress Notes (Signed)
Physical Therapy Treatment Patient Details  Name: Chase Chase MRN: [id number] Date of Birth: August 24, 1922  Today's Date: 04/29/2013 Time: 2841-3244 PT Time Calculation (min): 40 min  Visit#: 2 of 8  Re-eval: 05/24/13 Charges: Therex x 26' Manual x 12'  Authorization: Medicare  Authorization Visit#: 2 of 8   Subjective: Symptoms/Limitations Symptoms: Pt states that his neck bothers him worse than his back. Pain Assessment Currently in Pain?: Yes Pain Score:   6 Pain Location: Neck Pain Orientation: Right  Precautions/Restrictions     Exercise/Treatments Stretches Upper Trapezius Stretch: 2 reps;30 seconds Theraband Exercises Scapula Retraction: 10 reps;Green Shoulder Extension: 10 reps;Green Rows: 10 reps;Green Seated Exercises Neck Retraction: 10 reps Cervical Rotation: 10 reps;Limitations Cervical Rotation Limitations: 5" holds Lateral Flexion: 10 reps;Limitations Lateral Flexion Limitations: 5" holds X to V: 10 reps W Back: 10 reps Shoulder Shrugs: 10 reps  Physical Therapy Assessment and Plan PT Assessment and Plan Clinical Impression Statement: Pt requires frequent multimodal cueing to correct poor posture. Pt tends to slouch in chair with head forward. Significant tightness noted throughout cervical musculature. Manual techniques completed to this area to decrease pain and tightness. Pt reports decreased tightness and pain decrease to 5/10 at end of session.  PT Plan: Pt would like to concentrate on his neck first. Progress cervical and lumbar strength/ROM per PT POC.     Problem List Patient Active Problem List   Diagnosis Date Noted  . Neck stiffness 04/24/2013  . Postural imbalance 04/24/2013  . UTI (lower urinary tract infection) 04/08/2013  . Near syncope 04/08/2013  . Dehydration 04/08/2013  . History of vertigo 04/08/2013  . Abnormal EKG 04/08/2013  . Abnormal CXR (chest x-ray) 01/10/2012  . Bronchitis with flu 12/10/2011  . Glaucoma 12/10/2011     PT - End of Session Activity Tolerance: Patient tolerated treatment well General Behavior During Therapy: [facility] for tasks assessed/performed Cognition: [facility] for tasks performed  GP Functional Assessment Tool Used: neck disability   Seth Bake, PTA  04/29/2013, 4:28 PM

## 2013-04-30 DIAGNOSIS — N390 Urinary tract infection, site not specified: Secondary | ICD-10-CM | POA: Diagnosis not present

## 2013-04-30 DIAGNOSIS — Z6826 Body mass index (BMI) 26.0-26.9, adult: Secondary | ICD-10-CM | POA: Diagnosis not present

## 2013-05-01 ENCOUNTER — Telehealth (HOSPITAL_COMMUNITY): Payer: Self-pay | Admitting: Physical Therapy

## 2013-05-02 ENCOUNTER — Ambulatory Visit (HOSPITAL_COMMUNITY)
Admission: RE | Admit: 2013-05-02 | Discharge: 2013-05-02 | Disposition: A | Discharge status: Home / Self Care | Payer: Medicare Other | Source: Ambulatory Visit | Attending: Family Medicine | Admitting: Family Medicine

## 2013-05-02 DIAGNOSIS — R293 Abnormal posture: Secondary | ICD-10-CM

## 2013-05-02 DIAGNOSIS — M549 Dorsalgia, unspecified: Secondary | ICD-10-CM | POA: Diagnosis not present

## 2013-05-02 DIAGNOSIS — IMO0001 Reserved for inherently not codable concepts without codable children: Secondary | ICD-10-CM | POA: Diagnosis not present

## 2013-05-02 DIAGNOSIS — M542 Cervicalgia: Secondary | ICD-10-CM | POA: Diagnosis not present

## 2013-05-02 DIAGNOSIS — M436 Torticollis: Secondary | ICD-10-CM

## 2013-05-02 NOTE — Evaluation (Signed)
Physical Therapy Reassessment/ Discharge Patient Details  Name: Chase Chase MRN: [id number] Date of Birth: 17-May-1922 There ex from 850 to 905; manual from 906 to 930 Charge there ex x 1; manual x 2 Today's Date: 05/02/2013 Time: 8295-6213 PT Time Calculation (min): 40 min              Visit#: 3 of 3  Re-eval: 05/24/13   Authorization: Medicare    Authorization Visit#: 3 of 8   Past Medical History:  Past Medical History  Diagnosis Date  . Hypercholesterolemia   . Glaucoma(365)   . Prostate disease     unspecified   Past Surgical History:  Past Surgical History  Procedure Laterality Date  . Cholecystectomy    . Hernia repair      bilateral    Subjective Symptoms/Limitations Symptoms: Pt states that he feels he can work on his neck at home and desires this to be his last treatment Pain Assessment Currently in Pain?: Yes Pain Score:   4 Pain Location: Neck Pain Orientation: Right    Sensation/Coordination/Flexibility/Functional Tests Functional Tests Functional Tests: neck disablitity  16/50  Assessment RLE Strength Right Hip Flexion: 3+/5 Right Hip Extension: 3+/5 Right Hip ABduction: 3+/5 Right Knee Flexion: 5/5 Right Knee Extension: 3+/5 Right Ankle Dorsiflexion: 4/5 LLE Strength Left Hip Flexion: 5/5 Left Hip Extension: 4/5 Left Hip ABduction: 5/5 Left Knee Extension: 5/5 Left Ankle Dorsiflexion: 5/5 Cervical AROM Cervical Flexion: wnl Cervical Extension: wnl Cervical - Right Side Bend: decreased20% Cervical - Left Side Bend: decreased 20% was decreased 30% Cervical - Right Rotation: decreased 40% was decreased 60% Cervical - Left Rotation:  decreased 30 %  was decreased 30% Cervical Strength Cervical Extension: 5/5 Cervical - Right Side Bend: 4/5 Cervical - Left Side Bend: 4/5  Exercise/Treatments   Machines for Strengthening UBE (Upper Arm Bike):  (4') Theraband Exercises Shoulder Extension: 10 reps Rows: 10 reps Standing  Exercises Neck Retraction: 10 reps Other Standing Exercises: rotation x 5 Other Standing Exercises: standing attempting to pull up into proper posture x 10 Seated Exercises W Back: 10 reps    Manual Therapy Manual Therapy: Myofascial release Myofascial Release: with jt mobilization and suboccipital release to attempt to gain improved motion,  Physical Therapy Assessment and Plan PT Assessment and Plan Clinical Impression Statement: Pt states he desires to try to work on the exercises at home to improve his pain and ROM.  Pt will be discharged. PT Plan: discharge pt to a HEP    Goals Home Exercise Program Pt will Perform Home Exercise Program: Independently PT Goal: Perform Home Exercise Program - Progress: Met PT Short Term Goals PT Short Term Goal 1: Pt neck pain to be no greater than a 4/10 PT Short Term Goal 2: ROM improved by 20% PT Short Term Goal 2 - Progress: Progressing toward goal PT Long Term Goals Time to Complete Long Term Goals: 4 weeks PT Long Term Goal 1: Pt to be I in advance HEP PT Long Term Goal 1 - Progress: Not met PT Long Term Goal 2: PT to hold himself in an upright postion when walking PT Long Term Goal 2 - Progress: Not met Long Term Goal 3: Pt pain to be no greater than a 2/10 80% of the day in his neck. Long Term Goal 3 Progress: Not met Long Term Goal 4: Pt to be able to drive with no extra pain Long Term Goal 4 Progress: Not met PT Long Term Goal 5: ROM to be  wfl Long Term Goal 5 Progress: Not met  Problem List Patient Active Problem List   Diagnosis Date Noted  . Neck stiffness 04/24/2013  . Postural imbalance 04/24/2013  . UTI (lower urinary tract infection) 04/08/2013  . Near syncope 04/08/2013  . Dehydration 04/08/2013  . History of vertigo 04/08/2013  . Abnormal EKG 04/08/2013  . Abnormal CXR (chest x-ray) 01/10/2012  . Bronchitis with flu 12/10/2011  . Glaucoma 12/10/2011    PT - End of Session Activity Tolerance: Patient  tolerated treatment well General Behavior During Therapy: WFL for tasks assessed/performed PT Plan of Care PT Home Exercise Plan: given  GP Functional Assessment Tool Used: neck disability; clinical judgement Functional Limitation: Self care Self Care Goal Status (H0865): At least 20 percent but less than 40 percent impaired, limited or restricted Self Care Discharge Status [phone]): At least 20 percent but less than 40 percent impaired, limited or restricted  RUSSELL,CINDY 05/02/2013, 9:46 AM  Physician Documentation Your signature is required to indicate approval of the treatment plan as stated above.  Please sign and either send electronically or make a copy of this report for your files and return this physician signed original.   Please mark one 1.__approve of plan  2. ___approve of plan with the following conditions.   ______________________________                                                          _____________________ Physician Signature                                                                                                             Date

## 2013-05-15 DIAGNOSIS — N390 Urinary tract infection, site not specified: Secondary | ICD-10-CM | POA: Diagnosis not present

## 2013-05-15 DIAGNOSIS — R319 Hematuria, unspecified: Secondary | ICD-10-CM | POA: Diagnosis not present

## 2013-05-15 DIAGNOSIS — Z6826 Body mass index (BMI) 26.0-26.9, adult: Secondary | ICD-10-CM | POA: Diagnosis not present

## 2013-05-27 DIAGNOSIS — Z6826 Body mass index (BMI) 26.0-26.9, adult: Secondary | ICD-10-CM | POA: Diagnosis not present

## 2013-05-27 DIAGNOSIS — I10 Essential (primary) hypertension: Secondary | ICD-10-CM | POA: Diagnosis not present

## 2013-05-27 DIAGNOSIS — N390 Urinary tract infection, site not specified: Secondary | ICD-10-CM | POA: Diagnosis not present

## 2013-05-27 DIAGNOSIS — N400 Enlarged prostate without lower urinary tract symptoms: Secondary | ICD-10-CM | POA: Diagnosis not present

## 2013-06-07 DIAGNOSIS — N400 Enlarged prostate without lower urinary tract symptoms: Secondary | ICD-10-CM | POA: Diagnosis not present

## 2013-06-07 DIAGNOSIS — R350 Frequency of micturition: Secondary | ICD-10-CM | POA: Diagnosis not present

## 2013-06-07 DIAGNOSIS — Z6826 Body mass index (BMI) 26.0-26.9, adult: Secondary | ICD-10-CM | POA: Diagnosis not present

## 2013-06-09 DIAGNOSIS — R350 Frequency of micturition: Secondary | ICD-10-CM | POA: Diagnosis not present

## 2013-06-09 DIAGNOSIS — N400 Enlarged prostate without lower urinary tract symptoms: Secondary | ICD-10-CM | POA: Diagnosis not present

## 2013-07-22 DIAGNOSIS — R310 Gross hematuria: Secondary | ICD-10-CM | POA: Diagnosis not present

## 2013-07-22 DIAGNOSIS — N3020 Other chronic cystitis without hematuria: Secondary | ICD-10-CM | POA: Diagnosis not present

## 2013-07-22 DIAGNOSIS — N3000 Acute cystitis without hematuria: Secondary | ICD-10-CM | POA: Diagnosis not present

## 2013-08-20 DIAGNOSIS — Z6826 Body mass index (BMI) 26.0-26.9, adult: Secondary | ICD-10-CM | POA: Diagnosis not present

## 2013-08-20 DIAGNOSIS — Z23 Encounter for immunization: Secondary | ICD-10-CM | POA: Diagnosis not present

## 2013-08-20 DIAGNOSIS — I10 Essential (primary) hypertension: Secondary | ICD-10-CM | POA: Diagnosis not present

## 2013-08-20 DIAGNOSIS — N390 Urinary tract infection, site not specified: Secondary | ICD-10-CM | POA: Diagnosis not present

## 2013-08-20 DIAGNOSIS — J301 Allergic rhinitis due to pollen: Secondary | ICD-10-CM | POA: Diagnosis not present

## 2013-09-08 DIAGNOSIS — R339 Retention of urine, unspecified: Secondary | ICD-10-CM | POA: Diagnosis not present

## 2013-09-08 DIAGNOSIS — N3020 Other chronic cystitis without hematuria: Secondary | ICD-10-CM | POA: Diagnosis not present

## 2013-09-08 DIAGNOSIS — N401 Enlarged prostate with lower urinary tract symptoms: Secondary | ICD-10-CM | POA: Diagnosis not present

## 2014-05-25 DIAGNOSIS — H4011X0 Primary open-angle glaucoma, stage unspecified: Secondary | ICD-10-CM | POA: Diagnosis not present

## 2014-05-25 DIAGNOSIS — H52229 Regular astigmatism, unspecified eye: Secondary | ICD-10-CM | POA: Diagnosis not present

## 2014-05-25 DIAGNOSIS — H524 Presbyopia: Secondary | ICD-10-CM | POA: Diagnosis not present

## 2014-05-25 DIAGNOSIS — H409 Unspecified glaucoma: Secondary | ICD-10-CM | POA: Diagnosis not present

## 2014-06-03 DIAGNOSIS — M999 Biomechanical lesion, unspecified: Secondary | ICD-10-CM | POA: Diagnosis not present

## 2014-06-03 DIAGNOSIS — M546 Pain in thoracic spine: Secondary | ICD-10-CM | POA: Diagnosis not present

## 2014-06-03 DIAGNOSIS — M9981 Other biomechanical lesions of cervical region: Secondary | ICD-10-CM | POA: Diagnosis not present

## 2014-06-03 DIAGNOSIS — M542 Cervicalgia: Secondary | ICD-10-CM | POA: Diagnosis not present

## 2014-06-05 DIAGNOSIS — M546 Pain in thoracic spine: Secondary | ICD-10-CM | POA: Diagnosis not present

## 2014-06-05 DIAGNOSIS — M9981 Other biomechanical lesions of cervical region: Secondary | ICD-10-CM | POA: Diagnosis not present

## 2014-06-05 DIAGNOSIS — M542 Cervicalgia: Secondary | ICD-10-CM | POA: Diagnosis not present

## 2014-06-05 DIAGNOSIS — M999 Biomechanical lesion, unspecified: Secondary | ICD-10-CM | POA: Diagnosis not present

## 2014-06-08 DIAGNOSIS — M9981 Other biomechanical lesions of cervical region: Secondary | ICD-10-CM | POA: Diagnosis not present

## 2014-06-08 DIAGNOSIS — M999 Biomechanical lesion, unspecified: Secondary | ICD-10-CM | POA: Diagnosis not present

## 2014-06-08 DIAGNOSIS — M546 Pain in thoracic spine: Secondary | ICD-10-CM | POA: Diagnosis not present

## 2014-06-08 DIAGNOSIS — M542 Cervicalgia: Secondary | ICD-10-CM | POA: Diagnosis not present

## 2014-06-10 DIAGNOSIS — M546 Pain in thoracic spine: Secondary | ICD-10-CM | POA: Diagnosis not present

## 2014-06-10 DIAGNOSIS — M542 Cervicalgia: Secondary | ICD-10-CM | POA: Diagnosis not present

## 2014-06-10 DIAGNOSIS — M9981 Other biomechanical lesions of cervical region: Secondary | ICD-10-CM | POA: Diagnosis not present

## 2014-06-10 DIAGNOSIS — M999 Biomechanical lesion, unspecified: Secondary | ICD-10-CM | POA: Diagnosis not present

## 2014-06-11 DIAGNOSIS — M542 Cervicalgia: Secondary | ICD-10-CM | POA: Diagnosis not present

## 2014-06-11 DIAGNOSIS — M546 Pain in thoracic spine: Secondary | ICD-10-CM | POA: Diagnosis not present

## 2014-06-11 DIAGNOSIS — M999 Biomechanical lesion, unspecified: Secondary | ICD-10-CM | POA: Diagnosis not present

## 2014-06-11 DIAGNOSIS — M9981 Other biomechanical lesions of cervical region: Secondary | ICD-10-CM | POA: Diagnosis not present

## 2014-06-15 DIAGNOSIS — M9981 Other biomechanical lesions of cervical region: Secondary | ICD-10-CM | POA: Diagnosis not present

## 2014-06-15 DIAGNOSIS — M542 Cervicalgia: Secondary | ICD-10-CM | POA: Diagnosis not present

## 2014-06-15 DIAGNOSIS — M546 Pain in thoracic spine: Secondary | ICD-10-CM | POA: Diagnosis not present

## 2014-06-15 DIAGNOSIS — M999 Biomechanical lesion, unspecified: Secondary | ICD-10-CM | POA: Diagnosis not present

## 2014-06-17 DIAGNOSIS — M9981 Other biomechanical lesions of cervical region: Secondary | ICD-10-CM | POA: Diagnosis not present

## 2014-06-17 DIAGNOSIS — M546 Pain in thoracic spine: Secondary | ICD-10-CM | POA: Diagnosis not present

## 2014-06-17 DIAGNOSIS — M999 Biomechanical lesion, unspecified: Secondary | ICD-10-CM | POA: Diagnosis not present

## 2014-06-17 DIAGNOSIS — M542 Cervicalgia: Secondary | ICD-10-CM | POA: Diagnosis not present

## 2014-06-19 DIAGNOSIS — M999 Biomechanical lesion, unspecified: Secondary | ICD-10-CM | POA: Diagnosis not present

## 2014-06-19 DIAGNOSIS — M542 Cervicalgia: Secondary | ICD-10-CM | POA: Diagnosis not present

## 2014-06-19 DIAGNOSIS — M9981 Other biomechanical lesions of cervical region: Secondary | ICD-10-CM | POA: Diagnosis not present

## 2014-06-19 DIAGNOSIS — M546 Pain in thoracic spine: Secondary | ICD-10-CM | POA: Diagnosis not present

## 2014-06-22 DIAGNOSIS — M546 Pain in thoracic spine: Secondary | ICD-10-CM | POA: Diagnosis not present

## 2014-06-22 DIAGNOSIS — M542 Cervicalgia: Secondary | ICD-10-CM | POA: Diagnosis not present

## 2014-06-22 DIAGNOSIS — M9981 Other biomechanical lesions of cervical region: Secondary | ICD-10-CM | POA: Diagnosis not present

## 2014-06-22 DIAGNOSIS — M999 Biomechanical lesion, unspecified: Secondary | ICD-10-CM | POA: Diagnosis not present

## 2014-06-26 DIAGNOSIS — M9981 Other biomechanical lesions of cervical region: Secondary | ICD-10-CM | POA: Diagnosis not present

## 2014-06-26 DIAGNOSIS — M999 Biomechanical lesion, unspecified: Secondary | ICD-10-CM | POA: Diagnosis not present

## 2014-06-26 DIAGNOSIS — M546 Pain in thoracic spine: Secondary | ICD-10-CM | POA: Diagnosis not present

## 2014-06-26 DIAGNOSIS — M542 Cervicalgia: Secondary | ICD-10-CM | POA: Diagnosis not present

## 2014-07-02 DIAGNOSIS — H40059 Ocular hypertension, unspecified eye: Secondary | ICD-10-CM | POA: Diagnosis not present

## 2014-07-03 DIAGNOSIS — M546 Pain in thoracic spine: Secondary | ICD-10-CM | POA: Diagnosis not present

## 2014-07-03 DIAGNOSIS — M9981 Other biomechanical lesions of cervical region: Secondary | ICD-10-CM | POA: Diagnosis not present

## 2014-07-03 DIAGNOSIS — M542 Cervicalgia: Secondary | ICD-10-CM | POA: Diagnosis not present

## 2014-07-03 DIAGNOSIS — M999 Biomechanical lesion, unspecified: Secondary | ICD-10-CM | POA: Diagnosis not present

## 2014-08-06 DIAGNOSIS — H4011X0 Primary open-angle glaucoma, stage unspecified: Secondary | ICD-10-CM | POA: Diagnosis not present

## 2014-09-03 DIAGNOSIS — H40059 Ocular hypertension, unspecified eye: Secondary | ICD-10-CM | POA: Diagnosis not present

## 2014-09-08 DIAGNOSIS — Z23 Encounter for immunization: Secondary | ICD-10-CM | POA: Diagnosis not present

## 2014-09-08 DIAGNOSIS — E785 Hyperlipidemia, unspecified: Secondary | ICD-10-CM | POA: Diagnosis not present

## 2014-09-08 DIAGNOSIS — Z6826 Body mass index (BMI) 26.0-26.9, adult: Secondary | ICD-10-CM | POA: Diagnosis not present

## 2014-10-08 DIAGNOSIS — S0502XA Injury of conjunctiva and corneal abrasion without foreign body, left eye, initial encounter: Secondary | ICD-10-CM | POA: Diagnosis not present

## 2014-10-08 DIAGNOSIS — H4011X4 Primary open-angle glaucoma, indeterminate stage: Secondary | ICD-10-CM | POA: Diagnosis not present

## 2014-10-12 DIAGNOSIS — S0502XD Injury of conjunctiva and corneal abrasion without foreign body, left eye, subsequent encounter: Secondary | ICD-10-CM | POA: Diagnosis not present

## 2014-11-23 DIAGNOSIS — R2232 Localized swelling, mass and lump, left upper limb: Secondary | ICD-10-CM | POA: Diagnosis not present

## 2014-11-23 DIAGNOSIS — C44629 Squamous cell carcinoma of skin of left upper limb, including shoulder: Secondary | ICD-10-CM | POA: Diagnosis not present

## 2014-12-07 DIAGNOSIS — C44629 Squamous cell carcinoma of skin of left upper limb, including shoulder: Secondary | ICD-10-CM | POA: Diagnosis not present

## 2015-04-12 DIAGNOSIS — R42 Dizziness and giddiness: Secondary | ICD-10-CM | POA: Diagnosis not present

## 2015-04-12 DIAGNOSIS — E785 Hyperlipidemia, unspecified: Secondary | ICD-10-CM | POA: Diagnosis not present

## 2015-04-12 DIAGNOSIS — E663 Overweight: Secondary | ICD-10-CM | POA: Diagnosis not present

## 2015-04-12 DIAGNOSIS — Z6826 Body mass index (BMI) 26.0-26.9, adult: Secondary | ICD-10-CM | POA: Diagnosis not present

## 2015-04-12 DIAGNOSIS — E782 Mixed hyperlipidemia: Secondary | ICD-10-CM | POA: Diagnosis not present

## 2015-05-13 DIAGNOSIS — T1591XA Foreign body on external eye, part unspecified, right eye, initial encounter: Secondary | ICD-10-CM | POA: Diagnosis not present

## 2015-09-17 DIAGNOSIS — Z23 Encounter for immunization: Secondary | ICD-10-CM | POA: Diagnosis not present

## 2015-11-12 DIAGNOSIS — H524 Presbyopia: Secondary | ICD-10-CM | POA: Diagnosis not present

## 2015-11-12 DIAGNOSIS — H401132 Primary open-angle glaucoma, bilateral, moderate stage: Secondary | ICD-10-CM | POA: Diagnosis not present

## 2015-11-12 DIAGNOSIS — H5212 Myopia, left eye: Secondary | ICD-10-CM | POA: Diagnosis not present

## 2015-11-12 DIAGNOSIS — H52223 Regular astigmatism, bilateral: Secondary | ICD-10-CM | POA: Diagnosis not present

## 2016-03-14 DIAGNOSIS — L570 Actinic keratosis: Secondary | ICD-10-CM | POA: Diagnosis not present

## 2016-03-14 DIAGNOSIS — X32XXXA Exposure to sunlight, initial encounter: Secondary | ICD-10-CM | POA: Diagnosis not present

## 2016-04-17 DIAGNOSIS — Z1389 Encounter for screening for other disorder: Secondary | ICD-10-CM | POA: Diagnosis not present

## 2016-04-17 DIAGNOSIS — J209 Acute bronchitis, unspecified: Secondary | ICD-10-CM | POA: Diagnosis not present

## 2016-04-17 DIAGNOSIS — J069 Acute upper respiratory infection, unspecified: Secondary | ICD-10-CM | POA: Diagnosis not present

## 2016-04-17 DIAGNOSIS — Z6826 Body mass index (BMI) 26.0-26.9, adult: Secondary | ICD-10-CM | POA: Diagnosis not present

## 2016-05-18 DIAGNOSIS — H401132 Primary open-angle glaucoma, bilateral, moderate stage: Secondary | ICD-10-CM | POA: Diagnosis not present

## 2016-08-22 DIAGNOSIS — E559 Vitamin D deficiency, unspecified: Secondary | ICD-10-CM | POA: Diagnosis not present

## 2016-08-22 DIAGNOSIS — R32 Unspecified urinary incontinence: Secondary | ICD-10-CM | POA: Diagnosis not present

## 2016-08-22 DIAGNOSIS — R5383 Other fatigue: Secondary | ICD-10-CM | POA: Diagnosis not present

## 2016-08-22 DIAGNOSIS — E538 Deficiency of other specified B group vitamins: Secondary | ICD-10-CM | POA: Diagnosis not present

## 2016-08-22 DIAGNOSIS — M1991 Primary osteoarthritis, unspecified site: Secondary | ICD-10-CM | POA: Diagnosis not present

## 2016-08-22 DIAGNOSIS — Z6827 Body mass index (BMI) 27.0-27.9, adult: Secondary | ICD-10-CM | POA: Diagnosis not present

## 2016-08-22 DIAGNOSIS — N400 Enlarged prostate without lower urinary tract symptoms: Secondary | ICD-10-CM | POA: Diagnosis not present

## 2016-08-22 DIAGNOSIS — Z1389 Encounter for screening for other disorder: Secondary | ICD-10-CM | POA: Diagnosis not present

## 2016-09-21 DIAGNOSIS — Z23 Encounter for immunization: Secondary | ICD-10-CM | POA: Diagnosis not present

## 2016-11-16 DIAGNOSIS — H401111 Primary open-angle glaucoma, right eye, mild stage: Secondary | ICD-10-CM | POA: Diagnosis not present

## 2016-11-16 DIAGNOSIS — H401122 Primary open-angle glaucoma, left eye, moderate stage: Secondary | ICD-10-CM | POA: Diagnosis not present

## 2017-03-12 DIAGNOSIS — Z6826 Body mass index (BMI) 26.0-26.9, adult: Secondary | ICD-10-CM | POA: Diagnosis not present

## 2017-03-12 DIAGNOSIS — E538 Deficiency of other specified B group vitamins: Secondary | ICD-10-CM | POA: Diagnosis not present

## 2017-03-12 DIAGNOSIS — E663 Overweight: Secondary | ICD-10-CM | POA: Diagnosis not present

## 2017-03-12 DIAGNOSIS — Z0000 Encounter for general adult medical examination without abnormal findings: Secondary | ICD-10-CM | POA: Diagnosis not present

## 2017-03-12 DIAGNOSIS — E782 Mixed hyperlipidemia: Secondary | ICD-10-CM | POA: Diagnosis not present

## 2017-03-12 DIAGNOSIS — E748 Other specified disorders of carbohydrate metabolism: Secondary | ICD-10-CM | POA: Diagnosis not present

## 2017-03-12 DIAGNOSIS — Z1389 Encounter for screening for other disorder: Secondary | ICD-10-CM | POA: Diagnosis not present

## 2017-03-12 DIAGNOSIS — N400 Enlarged prostate without lower urinary tract symptoms: Secondary | ICD-10-CM | POA: Diagnosis not present

## 2017-09-10 DIAGNOSIS — Z23 Encounter for immunization: Secondary | ICD-10-CM | POA: Diagnosis not present

## 2017-12-17 ENCOUNTER — Encounter (HOSPITAL_COMMUNITY): Payer: Self-pay

## 2017-12-17 ENCOUNTER — Emergency Department (HOSPITAL_COMMUNITY)
Admission: EM | Admit: 2017-12-17 | Discharge: 2017-12-17 | Disposition: A | Discharge status: Home / Self Care | Payer: Medicare Other | Attending: Emergency Medicine | Admitting: Emergency Medicine

## 2017-12-17 DIAGNOSIS — E860 Dehydration: Secondary | ICD-10-CM | POA: Diagnosis not present

## 2017-12-17 DIAGNOSIS — F17220 Nicotine dependence, chewing tobacco, uncomplicated: Secondary | ICD-10-CM | POA: Diagnosis not present

## 2017-12-17 DIAGNOSIS — N390 Urinary tract infection, site not specified: Secondary | ICD-10-CM | POA: Insufficient documentation

## 2017-12-17 DIAGNOSIS — R350 Frequency of micturition: Secondary | ICD-10-CM | POA: Diagnosis present

## 2017-12-17 HISTORY — DX: Dizziness and giddiness: R42

## 2017-12-17 LAB — BASIC METABOLIC PANEL
Anion gap: 11 (ref 5–15)
BUN: 21 mg/dL — ABNORMAL HIGH (ref 6–20)
CO2: 26 mmol/L (ref 22–32)
CREATININE: 1.22 mg/dL (ref 0.61–1.24)
Calcium: 9.2 mg/dL (ref 8.9–10.3)
Chloride: 100 mmol/L — ABNORMAL LOW (ref 101–111)
GFR, EST AFRICAN AMERICAN: 57 mL/min — AB (ref >60)
GFR, EST NON AFRICAN AMERICAN: 49 mL/min — AB (ref >60)
Glucose, Bld: 106 mg/dL — ABNORMAL HIGH (ref 65–99)
Potassium: 4 mmol/L (ref 3.5–5.1)
SODIUM: 137 mmol/L (ref 135–145)

## 2017-12-17 LAB — URINALYSIS, ROUTINE W REFLEX MICROSCOPIC
BILIRUBIN URINE: NEGATIVE
Glucose, UA: NEGATIVE mg/dL
KETONES UR: NEGATIVE mg/dL
Nitrite: NEGATIVE
PH: 5 (ref 5.0–8.0)
PROTEIN: 100 mg/dL — AB
Specific Gravity, Urine: 1.014 (ref 1.005–1.030)

## 2017-12-17 LAB — CBC
HCT: 47.7 % (ref 39.0–52.0)
Hemoglobin: 15.7 g/dL (ref 13.0–17.0)
MCH: 34.1 pg — AB (ref 26.0–34.0)
MCHC: 32.9 g/dL (ref 30.0–36.0)
MCV: 103.7 fL — ABNORMAL HIGH (ref 78.0–100.0)
Platelets: 171 10*3/uL (ref 150–400)
RBC: 4.6 MIL/uL (ref 4.22–5.81)
RDW: 12.6 % (ref 11.5–15.5)
WBC: 10.5 10*3/uL (ref 4.0–10.5)

## 2017-12-17 MED ORDER — SODIUM CHLORIDE 0.9 % IV BOLUS (SEPSIS)
1500.0000 mL | Freq: Once | INTRAVENOUS | Status: AC
  Administered 2017-12-17: 1500 mL via INTRAVENOUS

## 2017-12-17 MED ORDER — LEVOFLOXACIN IN D5W 750 MG/150ML IV SOLN
750.0000 mg | Freq: Once | INTRAVENOUS | Status: AC
  Administered 2017-12-17: 750 mg via INTRAVENOUS
  Filled 2017-12-17: qty 150

## 2017-12-17 NOTE — ED Notes (Signed)
Pt given lunch tray.

## 2017-12-17 NOTE — ED Provider Notes (Signed)
Emergency Department Provider Note   I have reviewed the triage vital signs and the nursing notes.   HISTORY  Chief Complaint Urinary Frequency   HPI Chase Chase is a 84 y.o. male without significant medical history except for vertigo the presents the emergency department today with about a week worth of worsening weakness and difficulty ambulating.  Has had increased urinary frequency during that time and a decreased appetite and decreased hydration as well.  Went to urgent care where they thought he might have mild sinusitis but also had a urinary tract infection and apparently had orthostatic hypotension so they sent him here for further evaluation.  Patient's denying any other complaints at this time.  No chest pain, cough, fever, abdominal pain, rashes, headache.  He has persistent intermittent vertigo and clear nasal discharge and eye drainage but is not new. No other associated or modifying symptoms.    Past Medical History:  Diagnosis Date  . Glaucoma   . Hypercholesterolemia   . Prostate disease    unspecified  . Vertigo     Patient Active Problem List   Diagnosis Date Noted  . Neck stiffness 04/24/2013  . Postural imbalance 04/24/2013  . UTI (lower urinary tract infection) 04/08/2013  . Near syncope 04/08/2013  . Dehydration 04/08/2013  . History of vertigo 04/08/2013  . Abnormal EKG 04/08/2013  . Abnormal CXR (chest x-ray) 01/10/2012  . Bronchitis with flu 12/10/2011  . Glaucoma 12/10/2011    Past Surgical History:  Procedure Laterality Date  . CHOLECYSTECTOMY    . HERNIA REPAIR     bilateral    Current Outpatient Rx  . Order #: [id number] Class: Historical Med  . Order #: [id number] Class: Historical Med  . Order #: [id number] Class: Historical Med  . Order #: [id number] Class: Historical Med  . Order #: [id number] Class: Historical Med    Allergies Patient has no known allergies.  Family History  Problem Relation Age of Onset  . Breast cancer Sister     . Lung cancer Brother   . Lung cancer Brother   . Lung cancer Brother   . Breast cancer Sister   . Breast cancer Sister     Social History Social History   Tobacco Use  . Smoking status: Former Smoker    Packs/day: 2.00    Years: 8.00    Pack years: 16.00    Types: Cigarettes  . Smokeless tobacco: Current User    Types: Chew    Last attempt to quit: 10/12/2011  . Tobacco comment: Chewing and snuff  Substance Use Topics  . Alcohol use: No  . Drug use: No    Review of Systems  All other systems negative except as documented in the HPI. All pertinent positives and negatives as reviewed in the HPI. ____________________________________________   PHYSICAL EXAM:  VITAL SIGNS: ED Triage Vitals  Enc Vitals Group     BP 12/17/17 0925 (!) 123/59     Pulse Rate 12/17/17 0925 64     Resp 12/17/17 0925 18     Temp 12/17/17 0925 98.5 F (36.9 C)     Temp Source 12/17/17 0925 Oral     SpO2 12/17/17 0925 95 %     Weight 12/17/17 0926 165 lb (74.8 kg)    Constitutional: Alert and oriented. Well appearing and in no acute distress. Eyes: Conjunctivae are normal. PERRL. EOMI. Head: Atraumatic. Nose: mild rhinnorhea. No sinus ttp. Mouth/Throat: Mucous membranes are moist.  Oropharynx mildly erythematous. Neck: No stridor.  No meningeal  signs.   Cardiovascular: Normal rate, regular rhythm. Good peripheral circulation. Grossly normal heart sounds.   Respiratory: Normal respiratory effort.  No retractions. Lungs CTAB. Gastrointestinal: Soft and nontender. No distention.  Musculoskeletal: No lower extremity tenderness nor edema. No gross deformities of extremities. Neurologic:  Normal speech and language. No gross focal neurologic deficits are appreciated.  Skin:  Skin is warm, dry and intact. No rash noted.  ____________________________________________   LABS (all labs ordered are listed, but only abnormal results are displayed)  Labs Reviewed  URINALYSIS, ROUTINE W REFLEX  MICROSCOPIC - Abnormal; Notable for the following components:      Result Value   APPearance CLOUDY (*)    Hgb urine dipstick MODERATE (*)    Protein, ur 100 (*)    Leukocytes, UA LARGE (*)    Bacteria, UA RARE (*)    Squamous Epithelial / LPF 0-5 (*)    All other components within normal limits  BASIC METABOLIC PANEL - Abnormal; Notable for the following components:   Chloride 100 (*)    Glucose, Bld 106 (*)    BUN 21 (*)    GFR calc non Af Amer 49 (*)    GFR calc Af Amer 57 (*)    All other components within normal limits  CBC - Abnormal; Notable for the following components:   MCV 103.7 (*)    MCH 34.1 (*)    All other components within normal limits  URINE CULTURE   ____________________________________________  EKG   EKG Interpretation  Date/Time:  Monday December 17 2017 10:50:27 EST Ventricular Rate:  58 PR Interval:    QRS Duration: 151 QT Interval:  441 QTC Calculation: 434 R Axis:   -47 Text Interpretation:  Sinus rhythm Atrial premature complex RBBB and LAFB Left ventricular hypertrophy No significant change since last tracing Confirmed by Merrily Pew [phone]) on 12/17/2017 1:18:52 PM       ____________________________________________  RADIOLOGY  No results found.  ____________________________________________   PROCEDURES  Procedure(s) performed:   Procedures   ____________________________________________   INITIAL IMPRESSION / ASSESSMENT AND PLAN / ED COURSE  Suspect dehydration likely secondary to urinary tract infection and decreased intake.  Patient otherwise appears well with normal vital signs.  We will give some fluids IV antibiotics and reassess his weakness and disposition.  No focal abnormality to consider stroke at this time.  Is able to ambulate without difficulty after some fluids.  He appears well and is ready for discharge.  His family states he is back to his baseline as well.  He has a prescription for Levaquin he will follow-up  with his primary doctor in about a week his urine rechecked.  Otherwise return here if he has worsening or new symptoms.  Pertinent labs & imaging results that were available during my care of the patient were reviewed by me and considered in my medical decision making (see chart for details).  ____________________________________________  FINAL CLINICAL IMPRESSION(S) / ED DIAGNOSES  Final diagnoses:  Lower urinary tract infectious disease  Dehydration     MEDICATIONS GIVEN DURING THIS VISIT:  Medications  levofloxacin (LEVAQUIN) IVPB 750 mg (0 mg Intravenous Stopped 12/17/17 1543)  sodium chloride 0.9 % bolus 1,500 mL (0 mLs Intravenous Stopped 12/17/17 1543)     NEW OUTPATIENT MEDICATIONS STARTED DURING THIS VISIT:  This SmartLink is deprecated. Use AVSMEDLIST instead to display the medication list for a patient.  Note:  This note was prepared with assistance of Dragon voice recognition software. Occasional wrong-word or  sound-a-like substitutions may have occurred due to the inherent limitations of voice recognition software.   Merrily Pew, MD 12/17/17 [phone]

## 2017-12-17 NOTE — ED Triage Notes (Signed)
Pt sent from [facility] Due to UTI and possible dehydration. As well as sinusitis. Pt reports urinary burning at least one week and poor appetite. Pt also complains of left ear pain and dizziness

## 2017-12-20 LAB — URINE CULTURE: Culture: >=100000 — AB

## 2017-12-21 ENCOUNTER — Telehealth: Payer: Self-pay | Admitting: *Deleted

## 2017-12-21 NOTE — Telephone Encounter (Signed)
Post ED Visit - Positive Culture Follow-up  Culture report reviewed by antimicrobial stewardship pharmacist:  []  Elenor Quinones, Pharm.D. []  Heide Guile, Pharm.D., BCPS AQ-ID []  Parks Neptune, Pharm.D., BCPS []  Alycia Rossetti, Pharm.D., BCPS []  Brule, Pharm.D., BCPS, AAHIVP []  [name], Pharm.D., BCPS, AAHIVP []  Salome Arnt, PharmD, BCPS [x]  Jalene Mullet, PharmD []  Vincenza Hews, PharmD, BCPS  Positive urine culture Treated with Levaquin, organism sensitive to the same and no further patient follow-up is required at this time.  Harlon Flor [facility] 12/21/2017, 10:34 AM

## 2018-02-19 DIAGNOSIS — H43811 Vitreous degeneration, right eye: Secondary | ICD-10-CM | POA: Diagnosis not present

## 2018-02-19 DIAGNOSIS — H348322 Tributary (branch) retinal vein occlusion, left eye, stable: Secondary | ICD-10-CM | POA: Diagnosis not present

## 2018-02-19 DIAGNOSIS — H26491 Other secondary cataract, right eye: Secondary | ICD-10-CM | POA: Diagnosis not present

## 2018-02-19 DIAGNOSIS — Z961 Presence of intraocular lens: Secondary | ICD-10-CM | POA: Diagnosis not present

## 2018-04-12 ENCOUNTER — Emergency Department (HOSPITAL_COMMUNITY): Payer: Medicare Other

## 2018-04-12 ENCOUNTER — Encounter (HOSPITAL_COMMUNITY): Payer: Self-pay | Admitting: Emergency Medicine

## 2018-04-12 ENCOUNTER — Emergency Department (HOSPITAL_COMMUNITY)
Admission: EM | Admit: 2018-04-12 | Discharge: 2018-04-12 | Disposition: A | Discharge status: Home / Self Care | Payer: Medicare Other | Attending: Emergency Medicine | Admitting: Emergency Medicine

## 2018-04-12 ENCOUNTER — Other Ambulatory Visit: Payer: Self-pay

## 2018-04-12 DIAGNOSIS — R51 Headache: Secondary | ICD-10-CM | POA: Diagnosis not present

## 2018-04-12 DIAGNOSIS — R519 Headache, unspecified: Secondary | ICD-10-CM

## 2018-04-12 DIAGNOSIS — Z79899 Other long term (current) drug therapy: Secondary | ICD-10-CM | POA: Diagnosis not present

## 2018-04-12 DIAGNOSIS — J0110 Acute frontal sinusitis, unspecified: Secondary | ICD-10-CM | POA: Insufficient documentation

## 2018-04-12 DIAGNOSIS — F17220 Nicotine dependence, chewing tobacco, uncomplicated: Secondary | ICD-10-CM | POA: Diagnosis not present

## 2018-04-12 DIAGNOSIS — Z87891 Personal history of nicotine dependence: Secondary | ICD-10-CM | POA: Diagnosis not present

## 2018-04-12 LAB — CBC WITH DIFFERENTIAL/PLATELET
Basophils Absolute: 0 10*3/uL (ref 0.0–0.1)
Basophils Relative: 0 %
EOS ABS: 0.1 10*3/uL (ref 0.0–0.7)
EOS PCT: 1 %
HCT: 46.3 % (ref 39.0–52.0)
HEMOGLOBIN: 15.2 g/dL (ref 13.0–17.0)
LYMPHS ABS: 1.5 10*3/uL (ref 0.7–4.0)
Lymphocytes Relative: 19 %
MCH: 33.3 pg (ref 26.0–34.0)
MCHC: 32.8 g/dL (ref 30.0–36.0)
MCV: 101.3 fL — ABNORMAL HIGH (ref 78.0–100.0)
MONO ABS: 0.6 10*3/uL (ref 0.1–1.0)
Monocytes Relative: 7 %
Neutro Abs: 5.7 10*3/uL (ref 1.7–7.7)
Neutrophils Relative %: 73 %
Platelets: 158 10*3/uL (ref 150–400)
RBC: 4.57 MIL/uL (ref 4.22–5.81)
RDW: 12.7 % (ref 11.5–15.5)
WBC: 7.8 10*3/uL (ref 4.0–10.5)

## 2018-04-12 LAB — BASIC METABOLIC PANEL
Anion gap: 10 (ref 5–15)
BUN: 18 mg/dL (ref 6–20)
CALCIUM: 9 mg/dL (ref 8.9–10.3)
CHLORIDE: 101 mmol/L (ref 101–111)
CO2: 27 mmol/L (ref 22–32)
CREATININE: 1.14 mg/dL (ref 0.61–1.24)
GFR calc Af Amer: >60 mL/min (ref >60)
GFR calc non Af Amer: 53 mL/min — ABNORMAL LOW (ref >60)
Glucose, Bld: 133 mg/dL — ABNORMAL HIGH (ref 65–99)
Potassium: 4.5 mmol/L (ref 3.5–5.1)
SODIUM: 138 mmol/L (ref 135–145)

## 2018-04-12 LAB — SEDIMENTATION RATE: SED RATE: 2 mm/h (ref 0–16)

## 2018-04-12 MED ORDER — DIPHENHYDRAMINE HCL 50 MG/ML IJ SOLN
12.5000 mg | Freq: Once | INTRAMUSCULAR | Status: AC
  Administered 2018-04-12: 12.5 mg via INTRAVENOUS
  Filled 2018-04-12: qty 1

## 2018-04-12 MED ORDER — KETOROLAC TROMETHAMINE 30 MG/ML IJ SOLN
15.0000 mg | Freq: Once | INTRAMUSCULAR | Status: AC
Start: 2018-04-12 — End: 2018-04-12
  Administered 2018-04-12: 15 mg via INTRAVENOUS
  Filled 2018-04-12: qty 1

## 2018-04-12 MED ORDER — ACETAMINOPHEN 500 MG PO TABS
ORAL_TABLET | ORAL | Status: AC
  Administered 2018-04-12: 650 mg via ORAL
  Filled 2018-04-12: qty 2

## 2018-04-12 MED ORDER — METOCLOPRAMIDE HCL 5 MG/ML IJ SOLN
5.0000 mg | Freq: Once | INTRAMUSCULAR | Status: AC
Start: 2018-04-12 — End: 2018-04-12
  Administered 2018-04-12: 5 mg via INTRAVENOUS
  Filled 2018-04-12: qty 2

## 2018-04-12 MED ORDER — ACETAMINOPHEN 325 MG PO TABS
650.0000 mg | ORAL_TABLET | Freq: Once | ORAL | Status: AC
  Administered 2018-04-12: 650 mg via ORAL
  Filled 2018-04-12: qty 2

## 2018-04-12 MED ORDER — HYDROCODONE-ACETAMINOPHEN 5-325 MG PO TABS
1.0000 | ORAL_TABLET | ORAL | 0 refills | Status: DC | PRN
Start: 2018-04-12 — End: 2019-02-20

## 2018-04-12 MED ORDER — AMOXICILLIN-POT CLAVULANATE 875-125 MG PO TABS: 1.0000 | ORAL_TABLET | Freq: Two times a day (BID) | ORAL | 0 refills | Status: DC

## 2018-04-12 MED ORDER — SODIUM CHLORIDE 0.9 % IV BOLUS
500.0000 mL | Freq: Once | INTRAVENOUS | Status: AC
  Administered 2018-04-12: 500 mL via INTRAVENOUS

## 2018-04-12 NOTE — Discharge Instructions (Addendum)
Tests were good.  Prescription for antibiotic and pain medicine.  Can also take over-the-counter Zyrtec or Claritin.  Rest.  Follow-up with primary care doctor.

## 2018-04-12 NOTE — ED Triage Notes (Signed)
Pt states went to bed last night felt fine, headache woke him up at 4am today. States left sided from above eye back to posterior head and neck. Denies other deficits. Grips equal and strong, pupils equal and reactive.

## 2018-04-13 NOTE — ED Provider Notes (Signed)
[facility] EMERGENCY DEPARTMENT Provider Note   CSN: [id number] Arrival date & time: 04/12/18  3716     History   Chief Complaint Chief Complaint  Patient presents with  . Headache    HPI Chase Chase is a 84 y.o. male.  Patient reports left temporal headache approximately 4 AM this morning with watering of his eyes.  No gross neurological deficits, stiff neck, motor or sensory deficits.  He is tried nothing for his pain.  Severity is moderate.  His family is concerned about the possibility of sinusitis.  He lives independently at home.     Past Medical History:  Diagnosis Date  . Glaucoma   . Hypercholesterolemia   . Prostate disease    unspecified  . Vertigo     Patient Active Problem List   Diagnosis Date Noted  . Neck stiffness 04/24/2013  . Postural imbalance 04/24/2013  . UTI (lower urinary tract infection) 04/08/2013  . Near syncope 04/08/2013  . Dehydration 04/08/2013  . History of vertigo 04/08/2013  . Abnormal EKG 04/08/2013  . Abnormal CXR (chest x-ray) 01/10/2012  . Bronchitis with flu 12/10/2011  . Glaucoma 12/10/2011    Past Surgical History:  Procedure Laterality Date  . CHOLECYSTECTOMY    . HERNIA REPAIR     bilateral        Home Medications    Prior to Admission medications   Medication Sig Start Date End Date Taking? Authorizing Provider  amoxicillin-clavulanate (AUGMENTIN) 875-125 MG tablet Take 1 tablet by mouth 2 (two) times daily. 04/12/18   Nat Christen, MD  HYDROcodone-acetaminophen (NORCO/VICODIN) 5-325 MG tablet Take 1 tablet by mouth every 4 (four) hours as needed. 04/12/18   Nat Christen, MD  meclizine (ANTIVERT) 25 MG tablet Take 25 mg by mouth 3 (three) times daily as needed for dizziness.    [provider]  Misc Natural Products (PROSTATE HEALTH PO) Take 2 tablets by mouth daily.    [provider]  naproxen sodium (ALEVE) 220 MG tablet Take 220 mg by mouth as needed.    [provider]  Omega-3  Fatty Acids (FISH OIL) 645 MG CAPS Take 1,290 mg by mouth daily. Take 2 capsules daily for cholesterol.    [provider]  zinc gluconate 50 MG tablet Take 50 mg by mouth daily.    [provider]    Family History Family History  Problem Relation Age of Onset  . Breast cancer Sister   . Lung cancer Brother   . Lung cancer Brother   . Lung cancer Brother   . Breast cancer Sister   . Breast cancer Sister     Social History Social History   Tobacco Use  . Smoking status: Former Smoker    Packs/day: 2.00    Years: 8.00    Pack years: 16.00    Types: Cigarettes  . Smokeless tobacco: Current User    Types: Chew    Last attempt to quit: 10/12/2011  . Tobacco comment: Chewing and snuff  Substance Use Topics  . Alcohol use: No  . Drug use: No     Allergies   Patient has no known allergies.   Review of Systems Review of Systems  All other systems reviewed and are negative.    Physical Exam Updated Vital Signs BP (!) 105/57   Pulse (!) 52   Temp 98.2 F (36.8 C)   Resp 16   Wt 74.8 kg (165 lb)   SpO2 99%   BMI  25.09 kg/m   Physical Exam  Constitutional: He is oriented to person, place, and time. He appears well-developed and well-nourished.  Minimal distress.  HENT:  Head: Normocephalic and atraumatic.  No left temporal artery tenderness.  There is watering of the eyes left greater than right.  Minimal tenderness over left frontal and maxillary sinus  Eyes: Conjunctivae are normal.  Neck: Neck supple.  Cardiovascular: Normal rate and regular rhythm.  Pulmonary/Chest: Effort normal and breath sounds normal.  Abdominal: Soft. Bowel sounds are normal.  Musculoskeletal: Normal range of motion.  Neurological: He is alert and oriented to person, place, and time.  Skin: Skin is warm and dry.  Psychiatric: He has a normal mood and affect. His behavior is normal.  Nursing note and vitals reviewed.    ED Treatments / Results  Labs (all labs  ordered are listed, but only abnormal results are displayed) Labs Reviewed  CBC WITH DIFFERENTIAL/PLATELET - Abnormal; Notable for the following components:      Result Value   MCV 101.3 (*)    All other components within normal limits  BASIC METABOLIC PANEL - Abnormal; Notable for the following components:   Glucose, Bld 133 (*)    GFR calc non Af Amer 53 (*)    All other components within normal limits  SEDIMENTATION RATE    EKG None  Radiology Ct Head Wo Contrast  Result Date: 04/12/2018 CLINICAL DATA:  Acute onset severe headache EXAM: CT HEAD WITHOUT CONTRAST TECHNIQUE: Contiguous axial images were obtained from the base of the skull through the vertex without intravenous contrast. COMPARISON:  April 08, 2013 FINDINGS: Brain: There is mild diffuse atrophy. There is no intracranial mass, hemorrhage, extra-axial fluid collection, or midline shift. There is mild patchy small vessel disease in the centra semiovale bilaterally. There is evidence of a prior small infarct in mid right cerebellum, stable. No acute infarct is appreciable. Vascular: There is no appreciable hyperdense vessel. There is calcification in each distal vertebral artery and in each carotid siphon. Skull: Bony calvarium appears intact. Sinuses/Orbits: There is mild mucosal thickening in several ethmoid air cells. Other visualized paranasal sinuses are clear. There is leftward deviation of nasal septum. Orbits appear symmetric bilaterally. Other: Mastoid air cells are clear. IMPRESSION: Mild atrophy with slight periventricular small vessel disease. 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 No acute infarct evident. No mass or hemorrhage. There are foci of arterial vascular calcification. Mucosal thickening noted in several ethmoid air cells. Deviated nasal septum. Electronically Signed   By: Lowella Grip III M.D.   On: 04/12/2018 08:13    Procedures Procedures (including critical care time)  Medications  Ordered in ED Medications  acetaminophen (TYLENOL) tablet 650 mg (650 mg Oral Given 04/12/18 0737)  sodium chloride 0.9 % bolus 500 mL (0 mLs Intravenous Stopped 04/12/18 1116)  diphenhydrAMINE (BENADRYL) injection 12.5 mg (12.5 mg Intravenous Given 04/12/18 0943)  metoCLOPramide (REGLAN) injection 5 mg (5 mg Intravenous Given 04/12/18 0942)  ketorolac (TORADOL) 30 MG/ML injection 15 mg (15 mg Intravenous Given 04/12/18 0942)     Initial Impression / Assessment and Plan / ED Course  I have reviewed the triage vital signs and the nursing notes.  Pertinent labs & imaging results that were available during my care of the patient were reviewed by me and considered in my medical decision making (see chart for details).     Patient presented with left-sided headache and watering of eyes.  Neurological exam was grossly normal.  Differential of temporal  arteritis was considered.  CT head negative for acute findings.  Sed rate normal.  White count normal.  Patient responded well to IV fluids, IV Toradol, IV Benadryl, IV Reglan.  Discharge medications Augmentin 875/125 for his sinus infection and Vicodin.  Discussed findings with the patient and his family.  Final Clinical Impressions(s) / ED Diagnoses   Final diagnoses:  Acute intractable headache, unspecified headache type  Acute non-recurrent frontal sinusitis    ED Discharge Orders        Ordered    amoxicillin-clavulanate (AUGMENTIN) 875-125 MG tablet  2 times daily     04/12/18 1048    HYDROcodone-acetaminophen (NORCO/VICODIN) 5-325 MG tablet  Every 4 hours PRN     04/12/18 1048       Nat Christen, MD 04/13/18 [phone]

## 2018-08-22 DIAGNOSIS — H401111 Primary open-angle glaucoma, right eye, mild stage: Secondary | ICD-10-CM | POA: Diagnosis not present

## 2018-08-22 DIAGNOSIS — H401122 Primary open-angle glaucoma, left eye, moderate stage: Secondary | ICD-10-CM | POA: Diagnosis not present

## 2018-09-27 DIAGNOSIS — N401 Enlarged prostate with lower urinary tract symptoms: Secondary | ICD-10-CM | POA: Diagnosis not present

## 2018-09-27 DIAGNOSIS — Z23 Encounter for immunization: Secondary | ICD-10-CM | POA: Diagnosis not present

## 2018-09-27 DIAGNOSIS — K610 Anal abscess: Secondary | ICD-10-CM | POA: Diagnosis not present

## 2018-09-27 DIAGNOSIS — R3915 Urgency of urination: Secondary | ICD-10-CM | POA: Diagnosis not present

## 2018-09-27 DIAGNOSIS — Z6828 Body mass index (BMI) 28.0-28.9, adult: Secondary | ICD-10-CM | POA: Diagnosis not present

## 2018-11-05 DIAGNOSIS — E663 Overweight: Secondary | ICD-10-CM | POA: Diagnosis not present

## 2018-11-05 DIAGNOSIS — R5383 Other fatigue: Secondary | ICD-10-CM | POA: Diagnosis not present

## 2018-11-05 DIAGNOSIS — Z6826 Body mass index (BMI) 26.0-26.9, adult: Secondary | ICD-10-CM | POA: Diagnosis not present

## 2018-11-05 DIAGNOSIS — N401 Enlarged prostate with lower urinary tract symptoms: Secondary | ICD-10-CM | POA: Diagnosis not present

## 2018-11-05 DIAGNOSIS — N390 Urinary tract infection, site not specified: Secondary | ICD-10-CM | POA: Diagnosis not present

## 2018-11-05 DIAGNOSIS — R269 Unspecified abnormalities of gait and mobility: Secondary | ICD-10-CM | POA: Diagnosis not present

## 2018-11-05 DIAGNOSIS — E559 Vitamin D deficiency, unspecified: Secondary | ICD-10-CM | POA: Diagnosis not present

## 2018-11-05 DIAGNOSIS — E785 Hyperlipidemia, unspecified: Secondary | ICD-10-CM | POA: Diagnosis not present

## 2018-11-14 DIAGNOSIS — R972 Elevated prostate specific antigen [PSA]: Secondary | ICD-10-CM | POA: Diagnosis not present

## 2018-11-14 DIAGNOSIS — R8279 Other abnormal findings on microbiological examination of urine: Secondary | ICD-10-CM | POA: Diagnosis not present

## 2018-11-14 DIAGNOSIS — R8271 Bacteriuria: Secondary | ICD-10-CM | POA: Diagnosis not present

## 2019-01-04 ENCOUNTER — Emergency Department (HOSPITAL_COMMUNITY)
Admission: EM | Admit: 2019-01-04 | Discharge: 2019-01-04 | Disposition: A | Discharge status: Home / Self Care | Payer: Medicare Other | Attending: Emergency Medicine | Admitting: Emergency Medicine

## 2019-01-04 ENCOUNTER — Emergency Department (HOSPITAL_COMMUNITY): Payer: Medicare Other

## 2019-01-04 ENCOUNTER — Other Ambulatory Visit: Payer: Self-pay

## 2019-01-04 ENCOUNTER — Encounter (HOSPITAL_COMMUNITY): Payer: Self-pay | Admitting: Emergency Medicine

## 2019-01-04 DIAGNOSIS — S80212A Abrasion, left knee, initial encounter: Secondary | ICD-10-CM | POA: Insufficient documentation

## 2019-01-04 DIAGNOSIS — Y999 Unspecified external cause status: Secondary | ICD-10-CM | POA: Diagnosis not present

## 2019-01-04 DIAGNOSIS — W101XXA Fall (on)(from) sidewalk curb, initial encounter: Secondary | ICD-10-CM | POA: Insufficient documentation

## 2019-01-04 DIAGNOSIS — Z79899 Other long term (current) drug therapy: Secondary | ICD-10-CM | POA: Insufficient documentation

## 2019-01-04 DIAGNOSIS — Z23 Encounter for immunization: Secondary | ICD-10-CM | POA: Insufficient documentation

## 2019-01-04 DIAGNOSIS — Y92511 Restaurant or cafe as the place of occurrence of the external cause: Secondary | ICD-10-CM | POA: Diagnosis not present

## 2019-01-04 DIAGNOSIS — Y9301 Activity, walking, marching and hiking: Secondary | ICD-10-CM | POA: Insufficient documentation

## 2019-01-04 DIAGNOSIS — W19XXXA Unspecified fall, initial encounter: Secondary | ICD-10-CM

## 2019-01-04 DIAGNOSIS — F17220 Nicotine dependence, chewing tobacco, uncomplicated: Secondary | ICD-10-CM | POA: Insufficient documentation

## 2019-01-04 DIAGNOSIS — S0081XA Abrasion of other part of head, initial encounter: Secondary | ICD-10-CM | POA: Insufficient documentation

## 2019-01-04 DIAGNOSIS — S0990XA Unspecified injury of head, initial encounter: Secondary | ICD-10-CM

## 2019-01-04 DIAGNOSIS — R609 Edema, unspecified: Secondary | ICD-10-CM | POA: Diagnosis not present

## 2019-01-04 DIAGNOSIS — S199XXA Unspecified injury of neck, initial encounter: Secondary | ICD-10-CM | POA: Diagnosis not present

## 2019-01-04 MED ORDER — TETANUS-DIPHTH-ACELL PERTUSSIS 5-2.5-18.5 LF-MCG/0.5 IM SUSP
0.5000 mL | Freq: Once | INTRAMUSCULAR | Status: AC
  Administered 2019-01-04: 0.5 mL via INTRAMUSCULAR
  Filled 2019-01-04: qty 0.5

## 2019-01-04 MED ORDER — BACITRACIN ZINC 500 UNIT/GM EX OINT
TOPICAL_OINTMENT | Freq: Once | CUTANEOUS | Status: AC
Start: 2019-01-04 — End: 2019-01-04
  Administered 2019-01-04: 1 via TOPICAL
  Filled 2019-01-04: qty 1.8

## 2019-01-04 NOTE — Discharge Instructions (Addendum)
You were seen in the emergency department for injuries from a fall.  You had a CAT scan of your head and cervical spine that did not show any acute findings.  You should use soap and water for your abrasions and can put some bacitracin ointment on them.  We are giving you a list of head injury instructions for stuff to watch out for.  You should take Tylenol for pain as needed.  Please follow-up with your doctor and return if any worsening symptoms.

## 2019-01-04 NOTE — ED Triage Notes (Signed)
Pt comes in for a fall today with a laceration to forehead. Denies LOC or blood thinner use.

## 2019-01-04 NOTE — ED Notes (Signed)
Pt left without dc instructions.

## 2019-01-04 NOTE — ED Notes (Signed)
Cleaned abrasions to right palm, left knee, and left forehead with normal saline and bacitracin. Covered in sterile gauze. Patient tolerated well.

## 2019-01-04 NOTE — ED Provider Notes (Signed)
[facility] EMERGENCY DEPARTMENT Provider Note   CSN: [id number] Arrival date & time: 01/04/19  1659     History   Chief Complaint Chief Complaint  Patient presents with  . Fall    HPI Chase Chase is a 85 y.o. male.  He presents after a mechanical fall today.  His family states he was walking into [employer] and tripped on the curb and fell to his knees and then struck his forehead.  There was no loss of consciousness.  He denies any headache neck pain chest pain abdominal pain numbness or weakness or blurry vision.  He says he has mild tenderness over his forehead where he struck and his left knee.  He has been ambulatory after the fall and there is been no vomiting.  He does not know when his last tetanus shot was.  The history is provided by the patient and a relative.  Fall  This is a new problem. The current episode started 1 to 2 hours ago. The problem has been resolved. Associated symptoms include headaches. Pertinent negatives include no chest pain, no abdominal pain and no shortness of breath. Nothing aggravates the symptoms. The symptoms are relieved by rest. He has tried rest for the symptoms. The treatment provided moderate relief.    Past Medical History:  Diagnosis Date  . Glaucoma   . Hypercholesterolemia   . Prostate disease    unspecified  . Vertigo     Patient Active Problem List   Diagnosis Date Noted  . Neck stiffness 04/24/2013  . Postural imbalance 04/24/2013  . UTI (lower urinary tract infection) 04/08/2013  . Near syncope 04/08/2013  . Dehydration 04/08/2013  . History of vertigo 04/08/2013  . Abnormal EKG 04/08/2013  . Abnormal CXR (chest x-ray) 01/10/2012  . Bronchitis with flu 12/10/2011  . Glaucoma 12/10/2011    Past Surgical History:  Procedure Laterality Date  . CHOLECYSTECTOMY    . HERNIA REPAIR     bilateral        Home Medications    Prior to Admission medications   Medication Sig Start Date End Date Taking? Authorizing  Provider  amoxicillin-clavulanate (AUGMENTIN) 875-125 MG tablet Take 1 tablet by mouth 2 (two) times daily. 04/12/18   Nat Christen, MD  HYDROcodone-acetaminophen (NORCO/VICODIN) 5-325 MG tablet Take 1 tablet by mouth every 4 (four) hours as needed. 04/12/18   Nat Christen, MD  meclizine (ANTIVERT) 25 MG tablet Take 25 mg by mouth 3 (three) times daily as needed for dizziness.    [provider]  Misc Natural Products (PROSTATE HEALTH PO) Take 2 tablets by mouth daily.    [provider]  naproxen sodium (ALEVE) 220 MG tablet Take 220 mg by mouth as needed.    [provider]  Omega-3 Fatty Acids (FISH OIL) 645 MG CAPS Take 1,290 mg by mouth daily. Take 2 capsules daily for cholesterol.    [provider]  zinc gluconate 50 MG tablet Take 50 mg by mouth daily.    [provider]    Family History Family History  Problem Relation Age of Onset  . Breast cancer Sister   . Lung cancer Brother   . Lung cancer Brother   . Lung cancer Brother   . Breast cancer Sister   . Breast cancer Sister     Social History Social History   Tobacco Use  . Smoking status: Former Smoker    Packs/day: 2.00    Years: 8.00    Pack  years: 16.00    Types: Cigarettes  . Smokeless tobacco: Current User    Types: Chew    Last attempt to quit: 10/12/2011  . Tobacco comment: Chewing and snuff  Substance Use Topics  . Alcohol use: No  . Drug use: No     Allergies   Patient has no known allergies.   Review of Systems Review of Systems  Constitutional: Negative for fever.  HENT: Negative for sore throat.   Eyes: Negative for visual disturbance.  Respiratory: Negative for shortness of breath.   Cardiovascular: Negative for chest pain.  Gastrointestinal: Negative for abdominal pain.  Genitourinary: Negative for dysuria.  Musculoskeletal: Negative for neck pain.  Skin: Positive for wound. Negative for rash.  Neurological: Positive for headaches.     Physical  Exam Updated Vital Signs BP 124/65 (BP Location: Right Arm)   Pulse 63   Temp 98.1 F (36.7 C) (Oral)   Resp 16   Ht 5\' 8"  (1.727 m)   Wt 74.8 kg   SpO2 99%   BMI 25.09 kg/m   Physical Exam Vitals signs and nursing note reviewed.  Constitutional:      Appearance: He is well-developed.  HENT:     Head: Normocephalic.     Comments: He has a large abrasion and some swelling over his lower forehead in the midline.  This is approximately 5 cm.  Midface is stable.  No nosebleed.  Jaw full range of motion with no malocclusion or trismus. Eyes:     Conjunctiva/sclera: Conjunctivae normal.  Neck:     Musculoskeletal: Normal range of motion and neck supple. No muscular tenderness.  Cardiovascular:     Rate and Rhythm: Normal rate and regular rhythm.     Heart sounds: No murmur.  Pulmonary:     Effort: Pulmonary effort is normal. No respiratory distress.     Breath sounds: Normal breath sounds.  Abdominal:     Palpations: Abdomen is soft.     Tenderness: There is no abdominal tenderness.  Musculoskeletal: Normal range of motion.        General: Tenderness and signs of injury present. No deformity.     Comments: He has an abrasion over his left knee with some mild tenderness.  Full range of motion without any limitations.  Other extremities full range of motion without any pain or limitations.  Skin:    General: Skin is warm and dry.     Capillary Refill: Capillary refill takes less than 2 seconds.  Neurological:     Mental Status: He is alert. Mental status is at baseline.     Motor: No weakness.      ED Treatments / Results  Labs (all labs ordered are listed, but only abnormal results are displayed) Labs Reviewed - No data to display  EKG None  Radiology Ct Head Wo Contrast  Result Date: 01/04/2019 CLINICAL DATA:  Status post fall with laceration to the forehead. EXAM: CT HEAD WITHOUT CONTRAST CT CERVICAL SPINE WITHOUT CONTRAST TECHNIQUE: Multidetector CT imaging of the  head and cervical spine was performed following the standard protocol without intravenous contrast. Multiplanar CT image reconstructions of the cervical spine were also generated. COMPARISON:  04/12/2018 FINDINGS: CT HEAD FINDINGS Brain: No evidence of acute infarction, hemorrhage, hydrocephalus, extra-axial collection or mass lesion/mass effect. Mild brain parenchymal volume loss and deep white matter microangiopathy. Vascular: Calcific atherosclerotic disease at the skull base. Skull: Normal. Negative for fracture or focal lesion. Sinuses/Orbits: No acute finding. Other: Left frontal scalp  hematoma. CT CERVICAL SPINE FINDINGS Alignment: Straightening of the cervical lordosis, likely degenerative Skull base and vertebrae: No acute fracture. No primary bone lesion or focal pathologic process. Soft tissues and spinal canal: No prevertebral fluid or swelling. No visible canal hematoma. Disc levels: Multilevel osteoarthritic changes, moderate to advanced. Upper chest: Negative. Other: None. IMPRESSION: No acute intracranial abnormality. Moderate brain parenchymal volume loss and chronic microvascular disease. No evidence of acute traumatic injury to cervical spine. 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 Electronically Signed   By: Fidela Salisbury M.D.   On: 01/04/2019 18:27   Ct Cervical Spine Wo Contrast  Result Date: 01/04/2019 CLINICAL DATA:  Status post fall with laceration to the forehead. EXAM: CT HEAD WITHOUT CONTRAST CT CERVICAL SPINE WITHOUT CONTRAST TECHNIQUE: Multidetector CT imaging of the head and cervical spine was performed following the standard protocol without intravenous contrast. Multiplanar CT image reconstructions of the cervical spine were also generated. COMPARISON:  04/12/2018 FINDINGS: CT HEAD FINDINGS Brain: No evidence of acute infarction, hemorrhage, hydrocephalus, extra-axial collection or mass lesion/mass effect. Mild brain parenchymal volume  loss and deep white matter microangiopathy. Vascular: Calcific atherosclerotic disease at the skull base. Skull: Normal. Negative for fracture or focal lesion. Sinuses/Orbits: No acute finding. Other: Left frontal scalp hematoma. CT CERVICAL SPINE FINDINGS Alignment: Straightening of the cervical lordosis, likely degenerative Skull base and vertebrae: No acute fracture. No primary bone lesion or focal pathologic process. Soft tissues and spinal canal: No prevertebral fluid or swelling. No visible canal hematoma. Disc levels: Multilevel osteoarthritic changes, moderate to advanced. Upper chest: Negative. Other: None. IMPRESSION: No acute intracranial abnormality. Moderate brain parenchymal volume loss and chronic microvascular disease. No evidence of acute traumatic injury to cervical spine. 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 Electronically Signed   By: Fidela Salisbury M.D.   On: 01/04/2019 18:27    Procedures Procedures (including critical care time)  Medications Ordered in ED Medications  Tdap (BOOSTRIX) injection 0.5 mL (has no administration in time range)  bacitracin ointment (has no administration in time range)     Initial Impression / Assessment and Plan / ED Course  I have reviewed the triage vital signs and the nursing notes.  Pertinent labs & imaging results that were available during my care of the patient were reviewed by me and considered in my medical decision making (see chart for details).    Patient's wounds were cleaned and dressed.  His CT imaging does not show any acute findings.  He is ambulatory here with his family with assistance by a cane.  They are comfortable taking him home and will have somebody stay with him tonight.  Final Clinical Impressions(s) / ED Diagnoses   Final diagnoses:  Fall, initial encounter  Injury of head, initial encounter  Abrasion of forehead, initial encounter  Abrasion of left knee, initial  encounter    ED Discharge Orders    None       Hayden Rasmussen, MD 01/04/19 2056

## 2019-02-05 NOTE — ED Notes (Signed)
Patient was called and never returned to pick up instructions.

## 2019-02-20 ENCOUNTER — Other Ambulatory Visit: Payer: Self-pay

## 2019-02-20 ENCOUNTER — Encounter (HOSPITAL_COMMUNITY): Payer: Self-pay | Admitting: Emergency Medicine

## 2019-02-20 ENCOUNTER — Emergency Department (HOSPITAL_COMMUNITY)
Admission: EM | Admit: 2019-02-20 | Discharge: 2019-02-20 | Disposition: A | Discharge status: Home / Self Care | Payer: Medicare Other | Attending: Emergency Medicine | Admitting: Emergency Medicine

## 2019-02-20 DIAGNOSIS — Z87891 Personal history of nicotine dependence: Secondary | ICD-10-CM | POA: Insufficient documentation

## 2019-02-20 DIAGNOSIS — J111 Influenza due to unidentified influenza virus with other respiratory manifestations: Secondary | ICD-10-CM | POA: Diagnosis not present

## 2019-02-20 DIAGNOSIS — Z79899 Other long term (current) drug therapy: Secondary | ICD-10-CM | POA: Insufficient documentation

## 2019-02-20 DIAGNOSIS — J101 Influenza due to other identified influenza virus with other respiratory manifestations: Secondary | ICD-10-CM | POA: Insufficient documentation

## 2019-02-20 DIAGNOSIS — E860 Dehydration: Secondary | ICD-10-CM | POA: Diagnosis not present

## 2019-02-20 DIAGNOSIS — J029 Acute pharyngitis, unspecified: Secondary | ICD-10-CM | POA: Diagnosis not present

## 2019-02-20 LAB — BASIC METABOLIC PANEL
Anion gap: 6 (ref 5–15)
BUN: 24 mg/dL — ABNORMAL HIGH (ref 8–23)
CO2: 28 mmol/L (ref 22–32)
Calcium: 8.4 mg/dL — ABNORMAL LOW (ref 8.9–10.3)
Chloride: 103 mmol/L (ref 98–111)
Creatinine, Ser: 1.32 mg/dL — ABNORMAL HIGH (ref 0.61–1.24)
GFR calc Af Amer: 53 mL/min — ABNORMAL LOW (ref >60)
GFR calc non Af Amer: 46 mL/min — ABNORMAL LOW (ref >60)
Glucose, Bld: 84 mg/dL (ref 70–99)
Potassium: 3.9 mmol/L (ref 3.5–5.1)
Sodium: 137 mmol/L (ref 135–145)

## 2019-02-20 LAB — CBC WITH DIFFERENTIAL/PLATELET
Abs Immature Granulocytes: 0.02 10*3/uL (ref 0.00–0.07)
Basophils Absolute: 0 10*3/uL (ref 0.0–0.1)
Basophils Relative: 0 %
Eosinophils Absolute: 0.1 10*3/uL (ref 0.0–0.5)
Eosinophils Relative: 2 %
HCT: 43.8 % (ref 39.0–52.0)
Hemoglobin: 14.6 g/dL (ref 13.0–17.0)
Immature Granulocytes: 0 %
Lymphocytes Relative: 14 %
Lymphs Abs: 0.6 10*3/uL — ABNORMAL LOW (ref 0.7–4.0)
MCH: 34.8 pg — ABNORMAL HIGH (ref 26.0–34.0)
MCHC: 33.3 g/dL (ref 30.0–36.0)
MCV: 104.3 fL — ABNORMAL HIGH (ref 80.0–100.0)
Monocytes Absolute: 0.7 10*3/uL (ref 0.1–1.0)
Monocytes Relative: 15 %
Neutro Abs: 3.1 10*3/uL (ref 1.7–7.7)
Neutrophils Relative %: 69 %
Platelets: 130 10*3/uL — ABNORMAL LOW (ref 150–400)
RBC: 4.2 MIL/uL — ABNORMAL LOW (ref 4.22–5.81)
RDW: 12.7 % (ref 11.5–15.5)
WBC: 4.6 10*3/uL (ref 4.0–10.5)
nRBC: 0 % (ref 0.0–0.2)

## 2019-02-20 LAB — MAGNESIUM: Magnesium: 2.1 mg/dL (ref 1.7–2.4)

## 2019-02-20 MED ORDER — OSELTAMIVIR PHOSPHATE 30 MG PO CAPS: 30.0000 mg | ORAL_CAPSULE | Freq: Two times a day (BID) | ORAL | 0 refills | Status: DC

## 2019-02-20 MED ORDER — LACTATED RINGERS IV BOLUS
1000.0000 mL | Freq: Once | INTRAVENOUS | Status: AC
  Administered 2019-02-20: 1000 mL via INTRAVENOUS

## 2019-02-20 NOTE — ED Triage Notes (Signed)
Pt sent from [facility] for flu a + sent for ivf and dehydration

## 2019-03-01 NOTE — ED Provider Notes (Signed)
[facility] EMERGENCY DEPARTMENT Provider Note   CSN: [id number] Arrival date & time: 02/20/19  5697    History   Chief Complaint Chief Complaint  Patient presents with  . Influenza    HPI Chase Chase is a 85 y.o. male.     HPI   85 year old male with influenza A.  Diagnosed with this at urgent care.  He was referred to the emergency room for possible dehydration and administration of IV fluids.  He states he feels generally weak.  He states he is feels like he has no energy.  No focal deficits.  Occasional cough.  Denies shortness of breath.  Appetite is been fair.  No acute urinary complaints.  No diarrhea.  Past Medical History:  Diagnosis Date  . Glaucoma   . Hypercholesterolemia   . Prostate disease    unspecified  . Vertigo     Patient Active Problem List   Diagnosis Date Noted  . Neck stiffness 04/24/2013  . Postural imbalance 04/24/2013  . UTI (lower urinary tract infection) 04/08/2013  . Near syncope 04/08/2013  . Dehydration 04/08/2013  . History of vertigo 04/08/2013  . Abnormal EKG 04/08/2013  . Abnormal CXR (chest x-ray) 01/10/2012  . Bronchitis with flu 12/10/2011  . Glaucoma 12/10/2011    Past Surgical History:  Procedure Laterality Date  . CHOLECYSTECTOMY    . HERNIA REPAIR     bilateral        Home Medications    Prior to Admission medications   Medication Sig Start Date End Date Taking? Authorizing Provider  meclizine (ANTIVERT) 25 MG tablet Take 25 mg by mouth as needed for dizziness (rarely needs).    Yes [provider]  Misc Natural Products (PROSTATE HEALTH PO) Take 2 tablets by mouth daily.   Yes [provider]  Omega-3 Fatty Acids (FISH OIL) 645 MG CAPS Take 1,290 mg by mouth daily. Take 2 capsules daily for cholesterol.   Yes [provider]  zinc gluconate 50 MG tablet Take 50 mg by mouth daily.   Yes [provider]  oseltamivir (TAMIFLU) 30 MG capsule Take 1 capsule (30 mg total) by  mouth 2 (two) times daily. 02/20/19   Virgel Manifold, MD    Family History Family History  Problem Relation Age of Onset  . Breast cancer Sister   . Lung cancer Brother   . Lung cancer Brother   . Lung cancer Brother   . Breast cancer Sister   . Breast cancer Sister     Social History Social History   Tobacco Use  . Smoking status: Former Smoker    Packs/day: 2.00    Years: 8.00    Pack years: 16.00    Types: Cigarettes  . Smokeless tobacco: Current User    Types: Chew    Last attempt to quit: 10/12/2011  . Tobacco comment: Chewing and snuff  Substance Use Topics  . Alcohol use: No  . Drug use: No     Allergies   Patient has no known allergies.   Review of Systems Review of Systems All systems reviewed and negative, other than as noted in HPI.  All systems reviewed and negative, other than as noted in HPI. Physical Exam Updated Vital Signs BP 125/62   Pulse (!) 59   Temp 98.8 F (37.1 C) (Oral)   Resp 17   Ht 5\' 8"  (1.727 m)   Wt 74.8 kg   SpO2 98%   BMI 25.09 kg/m   Physical  Exam Vitals signs and nursing note reviewed.  Constitutional:      General: He is not in acute distress.    Appearance: He is well-developed.  HENT:     Head: Normocephalic and atraumatic.  Eyes:     General:        Right eye: No discharge.        Left eye: No discharge.     Conjunctiva/sclera: Conjunctivae normal.  Neck:     Musculoskeletal: Neck supple.  Cardiovascular:     Rate and Rhythm: Normal rate and regular rhythm.     Heart sounds: Normal heart sounds. No murmur. No friction rub. No gallop.   Pulmonary:     Effort: Pulmonary effort is normal. No respiratory distress.     Breath sounds: Normal breath sounds.  Abdominal:     General: There is no distension.     Palpations: Abdomen is soft.     Tenderness: There is no abdominal tenderness.  Musculoskeletal:        General: No tenderness.  Skin:    General: Skin is warm and dry.  Neurological:     Mental  Status: He is alert.  Psychiatric:        Behavior: Behavior normal.        Thought Content: Thought content normal.      ED Treatments / Results  Labs (all labs ordered are listed, but only abnormal results are displayed) Labs Reviewed  CBC WITH DIFFERENTIAL/PLATELET - Abnormal; Notable for the following components:      Result Value   RBC 4.20 (*)    MCV 104.3 (*)    MCH 34.8 (*)    Platelets 130 (*)    Lymphs Abs 0.6 (*)    All other components within normal limits  BASIC METABOLIC PANEL - Abnormal; Notable for the following components:   BUN 24 (*)    Creatinine, Ser 1.32 (*)    Calcium 8.4 (*)    GFR calc non Af Amer 46 (*)    GFR calc Af Amer 53 (*)    All other components within normal limits  MAGNESIUM    EKG None  Radiology No results found.  Procedures Procedures (including critical care time)  Medications Ordered in ED Medications  lactated ringers bolus 1,000 mL (0 mLs Intravenous Stopped 02/20/19 1450)     Initial Impression / Assessment and Plan / ED Course  I have reviewed the triage vital signs and the nursing notes.  Pertinent labs & imaging results that were available during my care of the patient were reviewed by me and considered in my medical decision making (see chart for details).        85 year old male with influenza.  Despite his advanced age, I feel he is appropriate for outpatient treatment.  He was given some IV fluids reports that he feels little bit better with this.  Continue symptomatic treatment.  Strict return precautions were discussed.  Outpatient follow-up otherwise.  Final Clinical Impressions(s) / ED Diagnoses   Final diagnoses:  Influenza    ED Discharge Orders         Ordered    oseltamivir (TAMIFLU) 30 MG capsule  2 times daily     02/20/19 1432           Virgel Manifold, MD 03/01/19 1909

## 2019-07-07 DIAGNOSIS — M79675 Pain in left toe(s): Secondary | ICD-10-CM | POA: Diagnosis not present

## 2019-07-07 DIAGNOSIS — B351 Tinea unguium: Secondary | ICD-10-CM | POA: Diagnosis not present

## 2019-07-07 DIAGNOSIS — L600 Ingrowing nail: Secondary | ICD-10-CM | POA: Diagnosis not present

## 2019-07-14 ENCOUNTER — Other Ambulatory Visit: Payer: Self-pay

## 2019-07-30 DIAGNOSIS — H401122 Primary open-angle glaucoma, left eye, moderate stage: Secondary | ICD-10-CM | POA: Diagnosis not present

## 2019-08-07 DIAGNOSIS — C44319 Basal cell carcinoma of skin of other parts of face: Secondary | ICD-10-CM | POA: Diagnosis not present

## 2019-08-07 DIAGNOSIS — D0439 Carcinoma in situ of skin of other parts of face: Secondary | ICD-10-CM | POA: Diagnosis not present

## 2019-08-07 DIAGNOSIS — C441191 Basal cell carcinoma of skin of left upper eyelid, including canthus: Secondary | ICD-10-CM | POA: Diagnosis not present

## 2019-09-08 DIAGNOSIS — Z08 Encounter for follow-up examination after completed treatment for malignant neoplasm: Secondary | ICD-10-CM | POA: Diagnosis not present

## 2019-09-08 DIAGNOSIS — Z85828 Personal history of other malignant neoplasm of skin: Secondary | ICD-10-CM | POA: Diagnosis not present

## 2019-09-11 DIAGNOSIS — I442 Atrioventricular block, complete: Secondary | ICD-10-CM

## 2019-09-11 HISTORY — DX: Atrioventricular block, complete: I44.2

## 2019-09-16 DIAGNOSIS — Z23 Encounter for immunization: Secondary | ICD-10-CM | POA: Diagnosis not present

## 2019-09-18 ENCOUNTER — Other Ambulatory Visit: Payer: Self-pay

## 2019-09-18 ENCOUNTER — Emergency Department (HOSPITAL_COMMUNITY): Payer: Medicare Other

## 2019-09-18 ENCOUNTER — Inpatient Hospital Stay (HOSPITAL_COMMUNITY)
Admission: EM | Admit: 2019-09-18 | Discharge: 2019-09-20 | DRG: 244 | Disposition: A | Discharge status: Home / Self Care | Payer: Medicare Other | Attending: Cardiovascular Disease | Admitting: Cardiovascular Disease

## 2019-09-18 ENCOUNTER — Encounter (HOSPITAL_COMMUNITY): Payer: Self-pay

## 2019-09-18 DIAGNOSIS — H9190 Unspecified hearing loss, unspecified ear: Secondary | ICD-10-CM | POA: Diagnosis not present

## 2019-09-18 DIAGNOSIS — Z87898 Personal history of other specified conditions: Secondary | ICD-10-CM

## 2019-09-18 DIAGNOSIS — I340 Nonrheumatic mitral (valve) insufficiency: Secondary | ICD-10-CM | POA: Diagnosis not present

## 2019-09-18 DIAGNOSIS — Z801 Family history of malignant neoplasm of trachea, bronchus and lung: Secondary | ICD-10-CM | POA: Diagnosis not present

## 2019-09-18 DIAGNOSIS — I442 Atrioventricular block, complete: Principal | ICD-10-CM | POA: Diagnosis present

## 2019-09-18 DIAGNOSIS — Z803 Family history of malignant neoplasm of breast: Secondary | ICD-10-CM

## 2019-09-18 DIAGNOSIS — Z950 Presence of cardiac pacemaker: Secondary | ICD-10-CM

## 2019-09-18 DIAGNOSIS — N183 Chronic kidney disease, stage 3 unspecified: Secondary | ICD-10-CM | POA: Diagnosis present

## 2019-09-18 DIAGNOSIS — Z20828 Contact with and (suspected) exposure to other viral communicable diseases: Secondary | ICD-10-CM | POA: Diagnosis present

## 2019-09-18 DIAGNOSIS — E7800 Pure hypercholesterolemia, unspecified: Secondary | ICD-10-CM | POA: Diagnosis present

## 2019-09-18 DIAGNOSIS — R0602 Shortness of breath: Secondary | ICD-10-CM | POA: Diagnosis not present

## 2019-09-18 DIAGNOSIS — H409 Unspecified glaucoma: Secondary | ICD-10-CM | POA: Diagnosis present

## 2019-09-18 DIAGNOSIS — R55 Syncope and collapse: Secondary | ICD-10-CM | POA: Diagnosis present

## 2019-09-18 DIAGNOSIS — Z87891 Personal history of nicotine dependence: Secondary | ICD-10-CM

## 2019-09-18 DIAGNOSIS — I452 Bifascicular block: Secondary | ICD-10-CM | POA: Diagnosis present

## 2019-09-18 HISTORY — DX: Unspecified hearing loss, unspecified ear: H91.90

## 2019-09-18 HISTORY — DX: Unspecified glaucoma: H40.9

## 2019-09-18 HISTORY — DX: Other specified personal risk factors, not elsewhere classified: Z91.89

## 2019-09-18 LAB — CBC WITH DIFFERENTIAL/PLATELET
Abs Immature Granulocytes: 0.04 10*3/uL (ref 0.00–0.07)
Basophils Absolute: 0.1 10*3/uL (ref 0.0–0.1)
Basophils Relative: 1 %
Eosinophils Absolute: 0.1 10*3/uL (ref 0.0–0.5)
Eosinophils Relative: 1 %
HCT: 49.1 % (ref 39.0–52.0)
Hemoglobin: 16.3 g/dL (ref 13.0–17.0)
Immature Granulocytes: 1 %
Lymphocytes Relative: 18 %
Lymphs Abs: 1.5 10*3/uL (ref 0.7–4.0)
MCH: 34 pg (ref 26.0–34.0)
MCHC: 33.2 g/dL (ref 30.0–36.0)
MCV: 102.5 fL — ABNORMAL HIGH (ref 80.0–100.0)
Monocytes Absolute: 0.8 10*3/uL (ref 0.1–1.0)
Monocytes Relative: 10 %
Neutro Abs: 5.9 10*3/uL (ref 1.7–7.7)
Neutrophils Relative %: 69 %
Platelets: 209 10*3/uL (ref 150–400)
RBC: 4.79 MIL/uL (ref 4.22–5.81)
RDW: 12.3 % (ref 11.5–15.5)
WBC: 8.3 10*3/uL (ref 4.0–10.5)
nRBC: 0 % (ref 0.0–0.2)

## 2019-09-18 LAB — COMPREHENSIVE METABOLIC PANEL
ALT: 19 U/L (ref 0–44)
AST: 22 U/L (ref 15–41)
Albumin: 3.9 g/dL (ref 3.5–5.0)
Alkaline Phosphatase: 72 U/L (ref 38–126)
Anion gap: 12 (ref 5–15)
BUN: 22 mg/dL (ref 8–23)
CO2: 23 mmol/L (ref 22–32)
Calcium: 9.4 mg/dL (ref 8.9–10.3)
Chloride: 102 mmol/L (ref 98–111)
Creatinine, Ser: 1.27 mg/dL — ABNORMAL HIGH (ref 0.61–1.24)
GFR calc Af Amer: 55 mL/min — ABNORMAL LOW (ref >60)
GFR calc non Af Amer: 48 mL/min — ABNORMAL LOW (ref >60)
Glucose, Bld: 115 mg/dL — ABNORMAL HIGH (ref 70–99)
Potassium: 4.3 mmol/L (ref 3.5–5.1)
Sodium: 137 mmol/L (ref 135–145)
Total Bilirubin: 1 mg/dL (ref 0.3–1.2)
Total Protein: 6.8 g/dL (ref 6.5–8.1)

## 2019-09-18 LAB — URINALYSIS, ROUTINE W REFLEX MICROSCOPIC
Bilirubin Urine: NEGATIVE
Glucose, UA: NEGATIVE mg/dL
Ketones, ur: NEGATIVE mg/dL
Nitrite: NEGATIVE
Protein, ur: NEGATIVE mg/dL
Specific Gravity, Urine: 1.01 (ref 1.005–1.030)
WBC, UA: >50 WBC/hpf — ABNORMAL HIGH (ref 0–5)
pH: 5 (ref 5.0–8.0)

## 2019-09-18 LAB — SARS CORONAVIRUS 2 BY RT PCR (HOSPITAL ORDER, PERFORMED IN ~~LOC~~ HOSPITAL LAB): SARS Coronavirus 2: NEGATIVE

## 2019-09-18 LAB — TROPONIN I (HIGH SENSITIVITY)
Troponin I (High Sensitivity): 10 ng/L (ref <18)
Troponin I (High Sensitivity): 11 ng/L (ref <18)

## 2019-09-18 LAB — GLUCOSE, CAPILLARY: Glucose-Capillary: 124 mg/dL — ABNORMAL HIGH (ref 70–99)

## 2019-09-18 MED ORDER — ENOXAPARIN SODIUM 30 MG/0.3ML ~~LOC~~ SOLN: 30.0000 mg | SUBCUTANEOUS | Status: DC

## 2019-09-18 NOTE — ED Triage Notes (Signed)
Pt brought to ED by son. Per son, pt had flu shot on Monday and ever since he has had episodic weakness, numbness all over body, vomited x 1 on Monday.

## 2019-09-18 NOTE — ED Provider Notes (Signed)
[facility] EMERGENCY DEPARTMENT Provider Note   CSN: [id number] Arrival date & time: 09/18/19  1148     History   Chief Complaint Chief Complaint  Patient presents with  . Weakness    HPI Chase Chase is a 85 y.o. male who presents with weakness.  Past medical history significant for history of vertigo, history of UTI.  Patient is accompanied by his daughter who helps provide history.  Patient lives alone and at baseline walks with a walker.  He states that he has had 2 or 3 "spells".  Where he will get extremely weak and feels short of breath.  This is usually when he is moving around and is better at rest.  He had his flu shot 2 days ago and several hours afterwards he started to feel generally weak.  He has been eating and drinking well.  He is urinates frequently and is having normal bowel movements.  He denies headache, chest pain, abdominal pain although he endorses a "fluttering" feeling in his mid abdomen which radiates up to his chest.  He denies any syncope or near syncope.  No fever, chills, body aches, URI symptoms, nausea, vomiting, diarrhea, blood in the stool. The patient was able to eat breakfast this morning and took an Aleve which helped his symptoms. The patient does not take any medications but takes several over-the-counter supplements such as vitamin D and medication for his prostate.     HPI  Past Medical History:  Diagnosis Date  . Glaucoma   . Hypercholesterolemia   . Prostate disease    unspecified  . Vertigo     Patient Active Problem List   Diagnosis Date Noted  . Neck stiffness 04/24/2013  . Postural imbalance 04/24/2013  . UTI (lower urinary tract infection) 04/08/2013  . Near syncope 04/08/2013  . Dehydration 04/08/2013  . History of vertigo 04/08/2013  . Abnormal EKG 04/08/2013  . Abnormal CXR (chest x-ray) 01/10/2012  . Bronchitis with flu 12/10/2011  . Glaucoma 12/10/2011    Past Surgical History:  Procedure Laterality Date  .  CHOLECYSTECTOMY    . HERNIA REPAIR     bilateral        Home Medications    Prior to Admission medications   Medication Sig Start Date End Date Taking? Authorizing Provider  meclizine (ANTIVERT) 25 MG tablet Take 25 mg by mouth as needed for dizziness (rarely needs).     [provider]  Misc Natural Products (PROSTATE HEALTH PO) Take 2 tablets by mouth daily.    [provider]  Omega-3 Fatty Acids (FISH OIL) 645 MG CAPS Take 1,290 mg by mouth daily. Take 2 capsules daily for cholesterol.    [provider]  oseltamivir (TAMIFLU) 30 MG capsule Take 1 capsule (30 mg total) by mouth 2 (two) times daily. 02/20/19   Virgel Manifold, MD  zinc gluconate 50 MG tablet Take 50 mg by mouth daily.    [provider]    Family History Family History  Problem Relation Age of Onset  . Breast cancer Sister   . Lung cancer Brother   . Lung cancer Brother   . Lung cancer Brother   . Breast cancer Sister   . Breast cancer Sister     Social History Social History   Tobacco Use  . Smoking status: Former Smoker    Packs/day: 2.00    Years: 8.00    Pack years: 16.00    Types: Cigarettes  . Smokeless tobacco: Current  User    Types: Sarina Ser    Last attempt to quit: 10/12/2011  . Tobacco comment: Chewing and snuff  Substance Use Topics  . Alcohol use: No  . Drug use: No     Allergies   Patient has no known allergies.   Review of Systems Review of Systems  Constitutional: Positive for activity change and fatigue. Negative for appetite change, chills and fever.  HENT: Negative for congestion and sore throat.   Respiratory: Positive for shortness of breath. Negative for cough and wheezing.   Cardiovascular: Negative for chest pain, palpitations and leg swelling.  Gastrointestinal: Negative for abdominal pain, blood in stool, diarrhea, nausea and vomiting.  Genitourinary: Positive for frequency. Negative for difficulty urinating and dysuria.   Musculoskeletal: Negative for back pain, myalgias and neck pain.  Neurological: Positive for weakness. Negative for syncope and headaches.  All other systems reviewed and are negative.    Physical Exam Updated Vital Signs BP (!) 124/55 (BP Location: Left Arm)   Pulse (!) 54   Temp 98 F (36.7 C) (Oral)   Resp 16   Ht 5\' 8"  (1.727 m)   Wt 74.8 kg   SpO2 98%   BMI 25.09 kg/m   Physical Exam Vitals signs and nursing note reviewed.  Constitutional:      General: He is not in acute distress.    Appearance: Normal appearance. He is well-developed. He is not ill-appearing.     Comments: Elderly male, NAD  HENT:     Head: Normocephalic and atraumatic.     Mouth/Throat:     Mouth: Mucous membranes are dry.  Eyes:     General: No scleral icterus.       Right eye: No discharge.        Left eye: No discharge.     Conjunctiva/sclera: Conjunctivae normal.     Pupils: Pupils are equal, round, and reactive to light.  Neck:     Musculoskeletal: Normal range of motion.  Cardiovascular:     Rate and Rhythm: Bradycardia present.  Pulmonary:     Effort: Pulmonary effort is normal. No respiratory distress.     Breath sounds: Normal breath sounds.  Abdominal:     General: There is no distension.     Palpations: Abdomen is soft.     Tenderness: There is no abdominal tenderness.  Musculoskeletal:     Comments: Moves all extremities  Skin:    General: Skin is warm and dry.  Neurological:     General: No focal deficit present.     Mental Status: He is alert and oriented to person, place, and time.  Psychiatric:        Mood and Affect: Mood normal.        Behavior: Behavior normal.      ED Treatments / Results  Labs (all labs ordered are listed, but only abnormal results are displayed) Labs Reviewed  URINALYSIS, ROUTINE W REFLEX MICROSCOPIC  COMPREHENSIVE METABOLIC PANEL  CBC WITH DIFFERENTIAL/PLATELET  TROPONIN I (HIGH SENSITIVITY)    EKG EKG Interpretation  Date/Time:   Thursday September 18 2019 12:25:53 EDT Ventricular Rate:  56 PR Interval:    QRS Duration: 152 QT Interval:  487 QTC Calculation: 470 R Axis:   -49 Text Interpretation:  Sinus rhythm Prolonged PR interval RBBB and LAFB Left ventricular hypertrophy Lateral infarct, age indeterminate No STEMI  Confirmed by Nanda Quinton [phone]) on 09/18/2019 12:38:26 PM   Radiology No results found.  Procedures Procedures (including critical care time)  CRITICAL CARE Performed by: Recardo Evangelist   Total critical care time: 30 minutes  Critical care time was exclusive of separately billable procedures and treating other patients.  Critical care was necessary to treat or prevent imminent or life-threatening deterioration.  Critical care was time spent personally by me on the following activities: development of treatment plan with patient and/or surrogate as well as nursing, discussions with consultants, evaluation of patient's response to treatment, examination of patient, obtaining history from patient or surrogate, ordering and performing treatments and interventions, ordering and review of laboratory studies, ordering and review of radiographic studies, pulse oximetry and re-evaluation of patient's condition.   Medications Ordered in ED Medications - No data to display   Initial Impression / Assessment and Plan / ED Course  I have reviewed the triage vital signs and the nursing notes.  Pertinent labs & imaging results that were available during my care of the patient were reviewed by me and considered in my medical decision making (see chart for details).  85 year old male presents with generalized weakness which comes and goes with associated shortness of breath and fluttering sensation.  He is noted to be bradycardic here and is unclear if this is his baseline or not.  Initial EKG shows sinus rhythm with prolonged PR, right bundle branch block, left anterior fascicular block, and left  ventricular hypertrophy without evidence of STEMI.  Will obtain blood work, chest x-ray, UA as patient frequently has UTIs.  Patient does not take any prescribed medication but does take over-the-counter supplements.  CBC is remarkable for high MCV which he has had in the past.  CMP is remarkable for mildly elevated Creatinine (1.27).  High-sensitivity troponin is 10.  UA shows questionable UTI with moderate hgb, large leukocytes, >50 WBC with WBC clumps. Will send urine culture as the patient is having urinary frequency although he does report drinking a lot of fluid.   Shared visit with Dr. Laverta Baltimore.  While he was in the room examining the patient, monitor was showing he was bradycardic in the 40s.  Will repeat EKG.  3:50 PM Repeat EKG shows possible complete heart block. Will consult cardiology.  Discussed with Dr. Bronson Ing - he agrees EKG shows CHB and recommends transfer to [facility]. I discussed with the patient and his daughter. They are agreeable to proceeding and transfer.  Final Clinical Impressions(s) / ED Diagnoses   Final diagnoses:  Complete heart block [facility])    ED Discharge Orders    None       Recardo Evangelist, PA-C 09/18/19 1651    Long, Wonda Olds, MD 09/19/19 [phone]

## 2019-09-18 NOTE — H&P (Addendum)
History & Physical    Patient ID: Chase Chase MRN: [id number], DOB/AGE: 07/10/1922   Admit date: 09/18/2019  Primary Care Provider: Sharilyn Sites, MD Primary Cardiologist: New to [facility] - Dr. Bronson Ing  Chief Complaint: Complete Heart Block  Patient Profile    Chase Chase is a 85 y.o. male with past medical history of glaucoma, HLD (no longer on medical therapy) and Stage 3 CKD who is being evaluated today for complete heart block at the request of Dr. Laverta Baltimore and Janetta Hora, PA.   History of Present Illness    Chase Chase reports that he is not overly active at baseline but still lives by himself and ambulates with a walker throughout his home. His daughter who is at the bedside says family lives close by and checks on him daily. Starting approximately 4 days ago he developed worsening dizziness and presyncope. Denies any actual syncopal episodes. Says he has been feeling more short of breath at rest and with activity. No associated chest pain or palpitations.  He did receive his flu shot 2 days ago and had nausea and vomiting after this and thought his symptoms might have been exacerbated since then. He does not take any medications daily besides Vitamin D. Not on any AV nodal blocking agents.  He is unaware of any prior cardiac history for himself including CAD, CHF or cardiac arrhythmias.  Reports his mother died from "heart issues" but is unable to elaborate on the specifics. He is a former smoker but denies any current use.    Initial labs show WBC 8.3, Hgb 16.3, platelets 209, Na+ 137, K+ 4.3, creatinine 1.27 (baseline creatinine 1.1 - 1.3). Initial and delta troponin negative at 10 and 11. CXR showing low lung volumes with atelectasis but no definitive infiltrates or effusions.  Initial EKG showed sinus bradycardia with RBBB and LAFB.  He continued to have intermittent episodes of bradycardia on telemetry which looked concerning for heart block and repeat tracing confirmed  complete heart block.   At the time of my examination, he is asymptomatic and denies any discomfort or shortness of breath.  No current nausea or vomiting.  Past Medical History:  Diagnosis Date  . Glaucoma   . Hypercholesterolemia   . Prostate disease    unspecified  . Vertigo     Past Surgical History:  Procedure Laterality Date  . CHOLECYSTECTOMY    . HERNIA REPAIR     bilateral     Medications Prior to Admission: Prior to Admission medications   Medication Sig Start Date End Date Taking? Authorizing Provider  latanoprost (XALATAN) 0.005 % ophthalmic solution Place 1 drop into both eyes at bedtime as needed.  08/01/19  Yes [provider]  meclizine (ANTIVERT) 25 MG tablet Take 25 mg by mouth as needed for dizziness (rarely needs).    Yes [provider]  Misc Natural Products (PROSTATE HEALTH PO) Take 2 tablets by mouth daily.   Yes [provider]  naproxen sodium (ALEVE) 220 MG tablet Take 220 mg by mouth daily as needed.   Yes [provider]  Omega-3 Fatty Acids (FISH OIL) 645 MG CAPS Take 1,290 mg by mouth daily. Take 2 capsules daily for cholesterol.   Yes [provider]  Vitamin D, Ergocalciferol, (DRISDOL) 1.25 MG (50000 UT) CAPS capsule Take 1 capsule by mouth every 30 (thirty) days. 08/23/19  Yes [provider]  zinc gluconate 50 MG tablet Take 50 mg by mouth daily.  Yes [provider]     Allergies:   No Known Allergies  Social History:   Social History   Socioeconomic History  . Marital status: Widowed    Spouse name: Not on file  . Number of children: Not on file  . Years of education: Not on file  . Highest education level: Not on file  Occupational History  . Occupation: retired  Scientific laboratory technician  . Financial resource strain: Not on file  . Food insecurity    Worry: Not on file    Inability: Not on file  . Transportation needs    Medical: Not on file    Non-medical: Not on file   Tobacco Use  . Smoking status: Former Smoker    Packs/day: 2.00    Years: 8.00    Pack years: 16.00    Types: Cigarettes  . Smokeless tobacco: Current User    Types: Chew    Last attempt to quit: 10/12/2011  . Tobacco comment: Chewing and snuff  Substance and Sexual Activity  . Alcohol use: No  . Drug use: No  . Sexual activity: Not on file  Lifestyle  . Physical activity    Days per week: Not on file    Minutes per session: Not on file  . Stress: Not on file  Relationships  . Social Herbalist on phone: Not on file    Gets together: Not on file    Attends religious service: Not on file    Active member of club or organization: Not on file    Attends meetings of clubs or organizations: Not on file    Relationship status: Not on file  . Intimate partner violence    Fear of current or ex partner: Not on file    Emotionally abused: Not on file    Physically abused: Not on file    Forced sexual activity: Not on file  Other Topics Concern  . Not on file  Social History Narrative  . Not on file      Family History:   The patient's family history includes Breast cancer in his sister, sister, and sister; Lung cancer in his brother, brother, and brother.     Review of Systems    General:  No chills, fever, night sweats or weight changes.  Cardiovascular:  No chest pain, dyspnea on exertion, edema, orthopnea, palpitations, paroxysmal nocturnal dyspnea. Positive for dizziness and presyncope.  Dermatological: No rash, lesions/masses Respiratory: No cough, dyspnea Urologic: No hematuria, dysuria Abdominal:   No diarrhea, bright red blood per rectum, melena, or hematemesis. Positive for nausea and vomiting.  Neurologic:  No visual changes, wkns, changes in mental status. All other systems reviewed and are otherwise negative except as noted above.  Physical Exam    Vitals:   09/18/19 1515 09/18/19 1530 09/18/19 1545 09/18/19 1550  BP:      Pulse: 62 (!) 46     Resp: (!) 24 20 18 18   Temp:      TempSrc:      SpO2: 100% 100% 99% 98%  Weight:      Height:       No intake or output data in the 24 hours ending 09/18/19 1645 Filed Weights   09/18/19 1216  Weight: 74.8 kg   Body mass index is 25.09 kg/m.   General: Well developed, elderly male appearing in no acute distress. Head: Normocephalic, atraumatic, sclera non-icteric, no xanthomas, nares are without discharge. Neck: No carotid bruits.  JVD not elevated.  Lungs: Respirations regular and unlabored, without wheezes or rales.  Heart: Regular rhythm, bradycardiac rate. No S3 or S4.  No murmur, no rubs, or gallops appreciated. Abdomen: Soft, non-tender, non-distended with normoactive bowel sounds. No hepatomegaly. No rebound/guarding. No obvious abdominal masses. Msk:  Strength and tone appear normal for age. No joint deformities or effusions. Extremities: No clubbing or cyanosis. No lower extremity edema.  Distal pedal pulses are 2+ bilaterally. Neuro: Alert and oriented X 3. Moves all extremities spontaneously. No focal deficits noted. Psych:  Responds to questions appropriately with a normal affect. Skin: No rashes or lesions noted  Labs and Radiology Studies    EKG:  The ECG that was done  was personally reviewed and demonstrates 2nd degree AV block Type 2, HR 44 with RBBB and LAFB.   Relevant CV Studies:  None on File.   Laboratory Data:  Chemistry Recent Labs  Lab 09/18/19 1330  NA 137  K 4.3  CL 102  CO2 23  GLUCOSE 115*  BUN 22  CREATININE 1.27*  CALCIUM 9.4  GFRNONAA 48*  GFRAA 55*  ANIONGAP 12    Recent Labs  Lab 09/18/19 1330  PROT 6.8  ALBUMIN 3.9  AST 22  ALT 19  ALKPHOS 72  BILITOT 1.0   Hematology Recent Labs  Lab 09/18/19 1330  WBC 8.3  RBC 4.79  HGB 16.3  HCT 49.1  MCV 102.5*  MCH 34.0  MCHC 33.2  RDW 12.3  PLT 209   Cardiac EnzymesNo results for input(s): TROPONINI in the last 168 hours. No results for input(s): TROPIPOC in the last  168 hours.  BNPNo results for input(s): BNP, PROBNP in the last 168 hours.  DDimer No results for input(s): DDIMER in the last 168 hours.  Radiology/Studies:  Dg Chest 2 View  Result Date: 09/18/2019 CLINICAL DATA:  Weakness and shortness of breath. EXAM: CHEST - 2 VIEW COMPARISON:  Chest x-ray 04/08/2013 FINDINGS: The cardiac silhouette, mediastinal and hilar contours are within normal limits given the AP projection. Low lung volumes with vascular crowding and bibasilar atelectasis. No definite infiltrates or effusions. The bony thorax is intact. IMPRESSION: Low lung volumes with vascular crowding and basilar atelectasis. No definite infiltrates or effusions. Electronically Signed   By: Marijo Sanes M.D.   On: 09/18/2019 14:15    Assessment and Plan:   1. Complete Heart Block - He presents with a 4-day history of worsening dizziness and presyncope. Also reports associated dyspnea on exertion and has experienced intermittent nausea.  - Was initially in sinus bradycardia but later found to have complete heart block. He is still having episodes of heart block frequently on telemetry during this encounter. Not on any AV nodal blocking agents prior to admission.  - He is asymptomatic at this time I reviewed with Dr. Bronson Ing and it is not felt that he needs a temporary pacer. Will ask for pacer pads to be placed at the bedside. Spoke with EP APP along with Card Master and will plan for PPM placement tomorrow (tentatively added to the board). Reviewed the procedure in detail with the patient and his daughter along with risks and benefits and he is in agreement to proceed. EP will see officially once the patient is at [facility].   2. Stage 3 CKD - baseline creatinine 1.1 - 1.3. Stable at 1.27 today.    Severity of Illness: The appropriate patient status for this patient is INPATIENT. Inpatient status is judged to be reasonable and  necessary in order to provide the required intensity of service to  ensure the patient's safety. The patient's presenting symptoms, physical exam findings, and initial radiographic and laboratory data in the context of their chronic comorbidities is felt to place them at high risk for further clinical deterioration. Furthermore, it is not anticipated that the patient will be medically stable for discharge from the hospital within 2 midnights of admission. The following factors support the patient status of inpatient.   " The patient's presenting symptoms include dyspnea and presyncope . " The worrisome physical exam findings include bradycardia. " The initial radiographic and laboratory data are worrisome because of complete heart block. " The chronic co-morbidities include glaucoma and Stage 3 CKD.   * I certify that at the point of admission it is my clinical judgment that the patient will require inpatient hospital care spanning beyond 2 midnights from the point of admission due to high intensity of service, high risk for further deterioration and high frequency of surveillance required.*    For questions or updates, please contact [place] Please consult [email or web address] for contact info under Cardiology/STEMI.   Signed, Erma Heritage, PA-C 09/18/2019, 4:45 PM Pager: [phone]  The patient was seen and examined, and I agree with the history, physical exam, assessment and plan as documented above, with modifications as noted below. I have also personally reviewed all relevant documentation, old records, labs, and both radiographic and cardiovascular studies. I have also independently interpreted old and new ECG's.  I was called by Janetta Hora PA-C in the ED to review the patient's ECG.  It demonstrated Mobitz II AV block.  I was told the patient presented with generalized weakness and fatigue.  I spoke with the patient and his daughter, Langley Gauss.  He has been experiencing about 4 to 5 days of generalized fatigue.  He felt like he might pass out  yesterday.  He lives alone.  He received a flu shot 2 days ago and felt very weak few hours afterwards and it was initially thought that this may have caused his symptoms.  He denies exertional chest pain.  He has occasional palpitations.  He denies any prior history of syncope.  On reviewing telemetry he goes into complete heart block.  He is hemodynamically stable.  Heart rate in 40 bpm range.  He is asymptomatic at rest.  We have contacted EP at [facility].  We will transfer him there for permanent pacemaker placement tentatively on 09/19/2019.  We will obtain an echocardiogram there to evaluate cardiac structure and function.  The patient and his daughter are in agreement with this plan.   Kate Sable, MD, [facility]  09/18/2019 5:34 PM

## 2019-09-19 ENCOUNTER — Encounter (HOSPITAL_COMMUNITY): Payer: Self-pay | Admitting: General Practice

## 2019-09-19 ENCOUNTER — Inpatient Hospital Stay (HOSPITAL_COMMUNITY)
Admission: EM | Disposition: A | Discharge status: Home / Self Care | Payer: Self-pay | Source: Home / Self Care | Attending: Cardiovascular Disease

## 2019-09-19 ENCOUNTER — Inpatient Hospital Stay (HOSPITAL_COMMUNITY): Payer: Medicare Other

## 2019-09-19 DIAGNOSIS — I340 Nonrheumatic mitral (valve) insufficiency: Secondary | ICD-10-CM

## 2019-09-19 DIAGNOSIS — Z950 Presence of cardiac pacemaker: Secondary | ICD-10-CM

## 2019-09-19 HISTORY — PX: PACEMAKER IMPLANT: EP1218

## 2019-09-19 HISTORY — DX: Presence of cardiac pacemaker: Z95.0

## 2019-09-19 LAB — BASIC METABOLIC PANEL
Anion gap: 10 (ref 5–15)
BUN: 17 mg/dL (ref 8–23)
CO2: 24 mmol/L (ref 22–32)
Calcium: 8.9 mg/dL (ref 8.9–10.3)
Chloride: 104 mmol/L (ref 98–111)
Creatinine, Ser: 1.25 mg/dL — ABNORMAL HIGH (ref 0.61–1.24)
GFR calc Af Amer: 56 mL/min — ABNORMAL LOW (ref >60)
GFR calc non Af Amer: 49 mL/min — ABNORMAL LOW (ref >60)
Glucose, Bld: 98 mg/dL (ref 70–99)
Potassium: 3.9 mmol/L (ref 3.5–5.1)
Sodium: 138 mmol/L (ref 135–145)

## 2019-09-19 LAB — ECHOCARDIOGRAM COMPLETE
Height: 68 in
Weight: 2619.2 oz

## 2019-09-19 LAB — MRSA PCR SCREENING: MRSA by PCR: NEGATIVE

## 2019-09-19 LAB — TSH: TSH: 5.168 u[IU]/mL — ABNORMAL HIGH (ref 0.350–4.500)

## 2019-09-19 SURGERY — PACEMAKER IMPLANT

## 2019-09-19 MED ORDER — ACETAMINOPHEN 325 MG PO TABS: 325.0000 mg | ORAL_TABLET | ORAL | Status: DC | PRN

## 2019-09-19 MED ORDER — SODIUM CHLORIDE 0.9 % IV SOLN
INTRAVENOUS | Status: DC | PRN
  Administered 2019-09-19: 250 mL via INTRAVENOUS

## 2019-09-19 MED ORDER — IOHEXOL 350 MG/ML SOLN
INTRAVENOUS | Status: DC | PRN
  Administered 2019-09-19: 16:00:00 15 mL

## 2019-09-19 MED ORDER — LIDOCAINE HCL (PF) 1 % IJ SOLN
INTRAMUSCULAR | Status: DC | PRN
  Administered 2019-09-19: 16:00:00 30 mL

## 2019-09-19 MED ORDER — CEFAZOLIN SODIUM-DEXTROSE 2-4 GM/100ML-% IV SOLN
2.0000 g | INTRAVENOUS | Status: AC
  Administered 2019-09-19: 16:00:00 2 g via INTRAVENOUS
  Filled 2019-09-19: qty 100

## 2019-09-19 MED ORDER — HEPARIN (PORCINE) IN NACL 1000-0.9 UT/500ML-% IV SOLN
INTRAVENOUS | Status: DC | PRN
  Administered 2019-09-19: 500 mL

## 2019-09-19 MED ORDER — SODIUM CHLORIDE 0.9 % IV SOLN
80.0000 mg | INTRAVENOUS | Status: AC
  Administered 2019-09-19: 17:00:00 80 mg

## 2019-09-19 MED ORDER — FENTANYL CITRATE (PF) 100 MCG/2ML IJ SOLN
INTRAMUSCULAR | Status: AC
  Filled 2019-09-19: qty 2

## 2019-09-19 MED ORDER — CEFAZOLIN SODIUM-DEXTROSE 1-4 GM/50ML-% IV SOLN
1.0000 g | Freq: Four times a day (QID) | INTRAVENOUS | Status: DC
  Administered 2019-09-19 – 2019-09-20 (×2): 1 g via INTRAVENOUS
  Filled 2019-09-19 (×5): qty 50

## 2019-09-19 MED ORDER — HEPARIN (PORCINE) IN NACL 1000-0.9 UT/500ML-% IV SOLN
INTRAVENOUS | Status: AC
  Filled 2019-09-19: qty 500

## 2019-09-19 MED ORDER — ONDANSETRON HCL 4 MG/2ML IJ SOLN: 4.0000 mg | Freq: Four times a day (QID) | INTRAMUSCULAR | Status: DC | PRN

## 2019-09-19 MED ORDER — LIDOCAINE HCL 1 % IJ SOLN
INTRAMUSCULAR | Status: AC
  Filled 2019-09-19: qty 60

## 2019-09-19 MED ORDER — CEFAZOLIN SODIUM-DEXTROSE 2-4 GM/100ML-% IV SOLN
INTRAVENOUS | Status: AC
  Filled 2019-09-19: qty 100

## 2019-09-19 MED ORDER — SODIUM CHLORIDE 0.9 % IV SOLN
INTRAVENOUS | Status: AC
  Filled 2019-09-19: qty 2

## 2019-09-19 MED ORDER — MIDAZOLAM HCL 5 MG/5ML IJ SOLN
INTRAMUSCULAR | Status: AC
  Filled 2019-09-19: qty 5

## 2019-09-19 SURGICAL SUPPLY — 7 items
CABLE SURGICAL S-101-97-12 (CABLE) ×3 IMPLANT
LEAD SOLIA S PRO MRI 45 (Lead) ×2 IMPLANT
LEAD SOLIA S PRO MRI 53 (Lead) ×2 IMPLANT
PACEMAKER EDORA 8DR-T MRI (Pacemaker) ×2 IMPLANT
PAD PRO RADIOLUCENT 2001M-C (PAD) ×3 IMPLANT
SHEATH 7FR PRELUDE SNAP 13 (SHEATH) ×4 IMPLANT
TRAY PACEMAKER INSERTION (PACKS) ×3 IMPLANT

## 2019-09-19 NOTE — Progress Notes (Signed)
  Echocardiogram 2D Echocardiogram has been performed.  Darlina Sicilian M 09/19/2019, 1:43 PM

## 2019-09-19 NOTE — Interval H&P Note (Signed)
History and Physical Interval Note:  09/19/2019 4:12 PM  Chase Chase  has presented today for surgery, with the diagnosis of heart block.  The various methods of treatment have been discussed with the patient and family. After consideration of risks, benefits and other options for treatment, the patient has consented to  Procedure(s): PACEMAKER IMPLANT (N/A) as a surgical intervention.  The patient's history has been reviewed, patient examined, no change in status, stable for surgery.  I have reviewed the patient's chart and labs.  Questions were answered to the patient's satisfaction.     Cristopher Peru

## 2019-09-19 NOTE — Discharge Instructions (Signed)
After Your Pacemaker   Do not lift your arm above shoulder height for 1 week after your procedure. After 7 days, you may progress as below.     Friday September 26, 2019  Saturday September 27, 2019 Sunday September 28, 2019 Monday September 29, 2019    Do not lift, push, pull, or carry anything over 10 pounds with the affected arm until 6 weeks (Friday October 31, 2019) after your procedure.    Do not drive until your wound check, or until instructed by your healthcare provider that you are safe to do so.    Monitor your pacemaker site for redness, swelling, and drainage. Call the device clinic at [phone] if you experience these symptoms or fever/chills.   If your incision is sealed with Steri-strips or staples. You may shower 7 days after your procedure and wash your incision with soap and water as long as it is healed. If your incision is closed with Dermabond/Surgical glue. You may shower 1 day after your pacemaker implant and wash your incision with soap and water. Avoid lotions, ointments, or perfumes over your incision until it is well-healed.   You may use a hot tub or a pool AFTER your wound check appointment if the incision is completely closed.   Your Pacemaker may be MRI compatible. We will discuss this at your first follow up/wound check. .    Remote monitoring is used to monitor your pacemaker from home. This monitoring is scheduled every 91 days by our office. It allows [place] to keep an eye on the functioning of your device to ensure it is working properly. You will routinely see your Electrophysiologist annually (more often if necessary).    Pacemaker Implantation, Care After This sheet gives you information about how to care for yourself after your procedure. Your health care provider may also give you more specific instructions. If you have problems or questions, contact your health care provider. What can I expect after the procedure? After the procedure, it is common  to have:  Mild pain.  Slight bruising.  Some swelling over the incision.  A slight bump over the skin where the device was placed. Sometimes, it is possible to feel the device under the skin. This is normal.  You should received your Pacemaker ID card within 4-8 weeks. Follow these instructions at home: Medicines  Take over-the-counter and prescription medicines only as told by your health care provider.  If you were prescribed an antibiotic medicine, take it as told by your health care provider. Do not stop taking the antibiotic even if you start to feel better. Wound care     Do not remove the bandage on your chest until directed to do so by your health care provider.  After your bandage is removed, you may see pieces of tape called skin adhesive strips over the area where the cut was made (incision site). Let them fall off on their own.  Check the incision site every day to make sure it is not infected, bleeding, or starting to pull apart.  Do not use lotions or ointments near the incision site unless directed to do so.  Keep the incision area clean and dry for 7 days after the procedure or as directed by your health care provider. It takes several weeks for the incision site to completely heal.  Do not take baths, swim, or use a hot tub for 7-10 days or as otherwise directed by your health care provider. Activity  Do not  drive or use heavy machinery while taking prescription pain medicine.  Do not drive for 24 hours if you were given a medicine to help you relax (sedative).  Check with your health care provider before you start to drive or play sports.  Avoid sudden jerking, pulling, or chopping movements that pull your upper arm far away from your body. Avoid these movements for at least 6 weeks or as long as told by your health care provider.  Do not lift your upper arm above your shoulders for at least 6 weeks or as long as told by your health care provider. This  means no tennis, golf, or swimming.  You may go back to work when your health care provider says it is okay. Pacemaker care  You may be shown how to transfer data from your pacemaker through the phone to your health care provider.  Always let all health care providers know about your pacemaker before you have any medical procedures or tests.  Wear a medical ID bracelet or necklace stating that you have a pacemaker. Carry a pacemaker ID card with you at all times.  Your pacemaker battery will last for 5-15 years. Routine checks by your health care provider will let the health care provider know when the battery is starting to run down. The pacemaker will need to be replaced when the battery starts to run down.  Do not use amateur Chief of Staff. Other electrical devices are safe to use, including power tools, lawn mowers, and speakers. If you are unsure of whether something is safe to use, ask your health care provider.  When using your cell phone, hold it to the ear opposite the pacemaker. Do not leave your cell phone in a pocket over the pacemaker.  Avoid places or objects that have a strong electric or magnetic field, including: ? Airport Herbalist. When at the airport, let officials know that you have a pacemaker. ? Power plants. ? Large electrical generators. ? Radiofrequency transmission towers, such as cell phone and radio towers. General instructions  Weigh yourself every day. If you suddenly gain weight, fluid may be building up in your body.  Keep all follow-up visits as told by your health care provider. This is important. Contact a health care provider if:  You gain weight suddenly.  Your legs or feet swell.  It feels like your heart is fluttering or skipping beats (heart palpitations).  You have chills or a fever.  You have more redness, swelling, or pain around your incisions.  You have more fluid or blood coming from your  incisions.  Your incisions feel warm to the touch.  You have pus or a bad smell coming from your incisions. Get help right away if:  You have chest pain.  You have trouble breathing or are short of breath.  You become extremely tired.  You are light-headed or you faint. This information is not intended to replace advice given to you by your health care provider. Make sure you discuss any questions you have with your health care provider.

## 2019-09-19 NOTE — Consult Note (Addendum)
ELECTROPHYSIOLOGY CONSULT NOTE    Patient ID: Chase Chase MRN: [id number], DOB/AGE: Feb 26, 1922 85 y.o.  Admit date: 09/18/2019 Date of Consult: 09/19/2019  Primary Physician: Sharilyn Sites, MD Primary Cardiologist: New to Dr. Bronson Ing Electrophysiologist: New to Dr. Lovena Le  Referring Provider: Dr. Bronson Ing  Patient Profile: Chase Chase is a 85 y.o. male with past medical history of glaucoma, HLD (no longer on medical therapy) and Stage 3 CKD who is being evaluated today for complete heart block at the request of Dr. Bronson Ing.   HPI:  [name] is a 85 y.o. male who at baseline is able to do most of his ADLs. He presented to [facility] with 4 days of dizziness and presyncope. He received his flu shot 2 days ago, and felt like this worsened it. He was not on any AV nodal blocking agents.  Pt found to have CHB on EKG and tele.   He is feeling OK at rest this am. Dr. Lovena Le had been by earlier, and pt has no additional questions concerning pacemaker implantation.   Past Medical History:  Diagnosis Date  . Glaucoma   . Hypercholesterolemia   . Prostate disease    unspecified  . Vertigo      Surgical History:  Past Surgical History:  Procedure Laterality Date  . CHOLECYSTECTOMY    . HERNIA REPAIR     bilateral     Medications Prior to Admission  Medication Sig Dispense Refill Last Dose  . latanoprost (XALATAN) 0.005 % ophthalmic solution Place 1 drop into both eyes at bedtime as needed.      . meclizine (ANTIVERT) 25 MG tablet Take 25 mg by mouth as needed for dizziness (rarely needs).      . Misc Natural Products (PROSTATE HEALTH PO) Take 2 tablets by mouth daily.   09/18/2019 at 0800  . naproxen sodium (ALEVE) 220 MG tablet Take 220 mg by mouth daily as needed.   09/18/2019 at 0800  . Omega-3 Fatty Acids (FISH OIL) 645 MG CAPS Take 1,290 mg by mouth daily. Take 2 capsules daily for cholesterol.   09/18/2019 at 0800  . Vitamin D, Ergocalciferol, (DRISDOL) 1.25 MG  (50000 UT) CAPS capsule Take 1 capsule by mouth every 30 (thirty) days.   Past Month at Unknown time  . zinc gluconate 50 MG tablet Take 50 mg by mouth daily.   09/18/2019 at 0800    Inpatient Medications:  . enoxaparin (LOVENOX) injection  30 mg Subcutaneous Q24H    Allergies: No Known Allergies  Social History   Socioeconomic History  . Marital status: Widowed    Spouse name: Not on file  . Number of children: Not on file  . Years of education: Not on file  . Highest education level: Not on file  Occupational History  . Occupation: retired  Scientific laboratory technician  . Financial resource strain: Not on file  . Food insecurity    Worry: Not on file    Inability: Not on file  . Transportation needs    Medical: Not on file    Non-medical: Not on file  Tobacco Use  . Smoking status: Former Smoker    Packs/day: 2.00    Years: 8.00    Pack years: 16.00    Types: Cigarettes  . Smokeless tobacco: Current User    Types: Chew    Last attempt to quit: 10/12/2011  . Tobacco comment: Chewing and snuff  Substance and Sexual Activity  . Alcohol use: No  . Drug  use: No  . Sexual activity: Not on file  Lifestyle  . Physical activity    Days per week: Not on file    Minutes per session: Not on file  . Stress: Not on file  Relationships  . Social Herbalist on phone: Not on file    Gets together: Not on file    Attends religious service: Not on file    Active member of club or organization: Not on file    Attends meetings of clubs or organizations: Not on file    Relationship status: Not on file  . Intimate partner violence    Fear of current or ex partner: Not on file    Emotionally abused: Not on file    Physically abused: Not on file    Forced sexual activity: Not on file  Other Topics Concern  . Not on file  Social History Narrative  . Not on file     Family History  Problem Relation Age of Onset  . Breast cancer Sister   . Lung cancer Brother   . Lung cancer  Brother   . Lung cancer Brother   . Breast cancer Sister   . Breast cancer Sister      Review of Systems: All other systems reviewed and are otherwise negative except as noted above.  Physical Exam: Vitals:   09/18/19 1935 09/18/19 2054 09/18/19 2140 09/19/19 0047  BP: (!) 117/55 (!) 145/62  137/76  Pulse:  (!) 42  64  Resp: (!) 23 14  16   Temp:  98.7 F (37.1 C)  98.3 F (36.8 C)  TempSrc:  Oral  Oral  SpO2: 98% 99%  100%  Weight:   74.7 kg 74.3 kg  Height:   5\' 8"  (1.727 m)     GEN- The patient is elderly appearing, alert and oriented x 3 today.   HEENT: normocephalic, atraumatic; sclera clear, conjunctiva pink; hearing intact; oropharynx clear; neck supple Lungs- Clear to ausculation bilaterally, normal work of breathing.  No wheezes, rales, rhonchi Heart- Slow and slightly irregular rate and rhythm, no murmurs, rubs or gallops GI- soft, non-tender, non-distended, bowel sounds present Extremities- no clubbing, cyanosis, or edema; DP/PT/radial pulses 2+ bilaterally MS- no significant deformity or atrophy Skin- warm and dry, no rash or lesion Psych- euthymic mood, full affect Neuro- strength and sensation are intact  Labs:   Lab Results  Component Value Date   WBC 8.3 09/18/2019   HGB 16.3 09/18/2019   HCT 49.1 09/18/2019   MCV 102.5 (H) 09/18/2019   PLT 209 09/18/2019    Recent Labs  Lab 09/18/19 1330 09/19/19 0534  NA 137 138  K 4.3 3.9  CL 102 104  CO2 23 24  BUN 22 17  CREATININE 1.27* 1.25*  CALCIUM 9.4 8.9  PROT 6.8  --   BILITOT 1.0  --   ALKPHOS 72  --   ALT 19  --   AST 22  --   GLUCOSE 115* 98      Radiology/Studies: Dg Chest 2 View  Result Date: 09/18/2019 CLINICAL DATA:  Weakness and shortness of breath. EXAM: CHEST - 2 VIEW COMPARISON:  Chest x-ray 04/08/2013 FINDINGS: The cardiac silhouette, mediastinal and hilar contours are within normal limits given the AP projection. Low lung volumes with vascular crowding and bibasilar  atelectasis. No definite infiltrates or effusions. The bony thorax is intact. IMPRESSION: Low lung volumes with vascular crowding and basilar atelectasis. No definite infiltrates or effusions. Electronically  Signed   By: Marijo Sanes M.D.   On: 09/18/2019 14:15   EKG: 10/8 shows CHB with escape at 42 bpm (personally reviewed)  TELEMETRY: shows sinus brady in the 50s currently, but CHB in the 30-40s overnight (personally reviewed)  Assessment/Plan: 1.  CHB The patient has symptomatic bradycardia in the setting of complete heart block with no reversible cause identified. We will proceed with PPM implantation this afternoon. Risks and benefits of the procedure have been discussed with the patient.  Echo pending.    For questions or updates, please contact [place] Please consult [email or web address] for contact info under Cardiology/STEMI.  Gerrianne Scale  Pager: [phone]  09/19/2019 8:26 AM   EP Attending  Patient seen and examined. Agree with the findings as noted above. The patient has CHB and mobitz 2, second degree AV block. I have discussed the treatment options including the risks/benefits/goals/expectations of PPM insertion and he wishes to proceed.  Mikle Bosworth.D.

## 2019-09-19 NOTE — H&P (View-Only) (Signed)
ELECTROPHYSIOLOGY CONSULT NOTE    Patient ID: Chase Chase MRN: [id number], DOB/AGE: 01-10-1922 85 y.o.  Admit date: 09/18/2019 Date of Consult: 09/19/2019  Primary Physician: Sharilyn Sites, MD Primary Cardiologist: New to Dr. Bronson Ing Electrophysiologist: New to Dr. Lovena Le  Referring Provider: Dr. Bronson Ing  Patient Profile: Chase Chase is a 85 y.o. male with past medical history of glaucoma, HLD (no longer on medical therapy) and Stage 3 CKD who is being evaluated today for complete heart block at the request of Dr. Bronson Ing.   HPI:  Chase Chase is a 85 y.o. male who at baseline is able to do most of his ADLs. He presented to [facility] with 4 days of dizziness and presyncope. He received his flu shot 2 days ago, and felt like this worsened it. He was not on any AV nodal blocking agents.  Pt found to have CHB on EKG and tele.   He is feeling OK at rest this am. Dr. Lovena Le had been by earlier, and pt has no additional questions concerning pacemaker implantation.   Past Medical History:  Diagnosis Date  . Glaucoma   . Hypercholesterolemia   . Prostate disease    unspecified  . Vertigo      Surgical History:  Past Surgical History:  Procedure Laterality Date  . CHOLECYSTECTOMY    . HERNIA REPAIR     bilateral     Medications Prior to Admission  Medication Sig Dispense Refill Last Dose  . latanoprost (XALATAN) 0.005 % ophthalmic solution Place 1 drop into both eyes at bedtime as needed.      . meclizine (ANTIVERT) 25 MG tablet Take 25 mg by mouth as needed for dizziness (rarely needs).      . Misc Natural Products (PROSTATE HEALTH PO) Take 2 tablets by mouth daily.   09/18/2019 at 0800  . naproxen sodium (ALEVE) 220 MG tablet Take 220 mg by mouth daily as needed.   09/18/2019 at 0800  . Omega-3 Fatty Acids (FISH OIL) 645 MG CAPS Take 1,290 mg by mouth daily. Take 2 capsules daily for cholesterol.   09/18/2019 at 0800  . Vitamin D, Ergocalciferol, (DRISDOL) 1.25 MG  (50000 UT) CAPS capsule Take 1 capsule by mouth every 30 (thirty) days.   Past Month at Unknown time  . zinc gluconate 50 MG tablet Take 50 mg by mouth daily.   09/18/2019 at 0800    Inpatient Medications:  . enoxaparin (LOVENOX) injection  30 mg Subcutaneous Q24H    Allergies: No Known Allergies  Social History   Socioeconomic History  . Marital status: Widowed    Spouse name: Not on file  . Number of children: Not on file  . Years of education: Not on file  . Highest education level: Not on file  Occupational History  . Occupation: retired  Scientific laboratory technician  . Financial resource strain: Not on file  . Food insecurity    Worry: Not on file    Inability: Not on file  . Transportation needs    Medical: Not on file    Non-medical: Not on file  Tobacco Use  . Smoking status: Former Smoker    Packs/day: 2.00    Years: 8.00    Pack years: 16.00    Types: Cigarettes  . Smokeless tobacco: Current User    Types: Chew    Last attempt to quit: 10/12/2011  . Tobacco comment: Chewing and snuff  Substance and Sexual Activity  . Alcohol use: No  . Drug  use: No  . Sexual activity: Not on file  Lifestyle  . Physical activity    Days per week: Not on file    Minutes per session: Not on file  . Stress: Not on file  Relationships  . Social Herbalist on phone: Not on file    Gets together: Not on file    Attends religious service: Not on file    Active member of club or organization: Not on file    Attends meetings of clubs or organizations: Not on file    Relationship status: Not on file  . Intimate partner violence    Fear of current or ex partner: Not on file    Emotionally abused: Not on file    Physically abused: Not on file    Forced sexual activity: Not on file  Other Topics Concern  . Not on file  Social History Narrative  . Not on file     Family History  Problem Relation Age of Onset  . Breast cancer Sister   . Lung cancer Brother   . Lung cancer  Brother   . Lung cancer Brother   . Breast cancer Sister   . Breast cancer Sister      Review of Systems: All other systems reviewed and are otherwise negative except as noted above.  Physical Exam: Vitals:   09/18/19 1935 09/18/19 2054 09/18/19 2140 09/19/19 0047  BP: (!) 117/55 (!) 145/62  137/76  Pulse:  (!) 42  64  Resp: (!) 23 14  16   Temp:  98.7 F (37.1 C)  98.3 F (36.8 C)  TempSrc:  Oral  Oral  SpO2: 98% 99%  100%  Weight:   74.7 kg 74.3 kg  Height:   5\' 8"  (1.727 m)     GEN- The patient is elderly appearing, alert and oriented x 3 today.   HEENT: normocephalic, atraumatic; sclera clear, conjunctiva pink; hearing intact; oropharynx clear; neck supple Lungs- Clear to ausculation bilaterally, normal work of breathing.  No wheezes, rales, rhonchi Heart- Slow and slightly irregular rate and rhythm, no murmurs, rubs or gallops GI- soft, non-tender, non-distended, bowel sounds present Extremities- no clubbing, cyanosis, or edema; DP/PT/radial pulses 2+ bilaterally MS- no significant deformity or atrophy Skin- warm and dry, no rash or lesion Psych- euthymic mood, full affect Neuro- strength and sensation are intact  Labs:   Lab Results  Component Value Date   WBC 8.3 09/18/2019   HGB 16.3 09/18/2019   HCT 49.1 09/18/2019   MCV 102.5 (H) 09/18/2019   PLT 209 09/18/2019    Recent Labs  Lab 09/18/19 1330 09/19/19 0534  NA 137 138  K 4.3 3.9  CL 102 104  CO2 23 24  BUN 22 17  CREATININE 1.27* 1.25*  CALCIUM 9.4 8.9  PROT 6.8  --   BILITOT 1.0  --   ALKPHOS 72  --   ALT 19  --   AST 22  --   GLUCOSE 115* 98      Radiology/Studies: Dg Chest 2 View  Result Date: 09/18/2019 CLINICAL DATA:  Weakness and shortness of breath. EXAM: CHEST - 2 VIEW COMPARISON:  Chest x-ray 04/08/2013 FINDINGS: The cardiac silhouette, mediastinal and hilar contours are within normal limits given the AP projection. Low lung volumes with vascular crowding and bibasilar  atelectasis. No definite infiltrates or effusions. The bony thorax is intact. IMPRESSION: Low lung volumes with vascular crowding and basilar atelectasis. No definite infiltrates or effusions. Electronically  Signed   By: Marijo Sanes M.D.   On: 09/18/2019 14:15   EKG: 10/8 shows CHB with escape at 42 bpm (personally reviewed)  TELEMETRY: shows sinus brady in the 50s currently, but CHB in the 30-40s overnight (personally reviewed)  Assessment/Plan: 1.  CHB The patient has symptomatic bradycardia in the setting of complete heart block with no reversible cause identified. We will proceed with PPM implantation this afternoon. Risks and benefits of the procedure have been discussed with the patient.  Echo pending.    For questions or updates, please contact [place] Please consult [email or web address] for contact info under Cardiology/STEMI.  Gerrianne Scale  Pager: [phone]  09/19/2019 8:26 AM   EP Attending  Patient seen and examined. Agree with the findings as noted above. The patient has CHB and mobitz 2, second degree AV block. I have discussed the treatment options including the risks/benefits/goals/expectations of PPM insertion and he wishes to proceed.  Mikle Bosworth.D.

## 2019-09-20 ENCOUNTER — Encounter (HOSPITAL_COMMUNITY): Payer: Self-pay | Admitting: Physician Assistant

## 2019-09-20 ENCOUNTER — Inpatient Hospital Stay (HOSPITAL_COMMUNITY): Payer: Medicare Other

## 2019-09-20 LAB — URINE CULTURE

## 2019-09-20 NOTE — Progress Notes (Signed)
Discharge instructions given with stated understanding 

## 2019-09-20 NOTE — Plan of Care (Signed)
  Problem: Education: Goal: Ability to demonstrate management of disease process will improve Outcome: Progressing Goal: Ability to verbalize understanding of medication therapies will improve Outcome: Progressing Goal: Individualized Educational Video(s) Outcome: Progressing   Problem: Activity: Goal: Capacity to carry out activities will improve Outcome: Progressing   Problem: Cardiac: Goal: Ability to achieve and maintain adequate cardiopulmonary perfusion will improve Outcome: Progressing   Problem: Safety: Goal: Ability to remain free from injury will improve Outcome: Progressing   Problem: Skin Integrity: Goal: Risk for impaired skin integrity will decrease Outcome: Progressing

## 2019-09-20 NOTE — Discharge Summary (Signed)
Discharge Summary    Patient ID: Chase Chase MRN: [id number]; DOB: 12-22-1921  Admit date: 09/18/2019 Discharge date: 09/20/2019  Primary Care Provider: Sharilyn Sites, MD  Primary Cardiologist: Kate Sable, MD  Primary Electrophysiologist:  None   Discharge Diagnoses    Principal Problem:   CHB (complete heart block) [facility]) Active Problems:   Near syncope   History of vertigo   Allergies No Known Allergies  Diagnostic Studies/Procedures    Echo 09/19/19:   1. Left ventricular ejection fraction, by visual estimation, is 55 to 60%. The left ventricle has normal function. Normal left ventricular size. There is mildly increased left ventricular hypertrophy.  2. Left ventricular diastolic Doppler parameters are consistent with impaired relaxation pattern of LV diastolic filling.  3. Global right ventricle has normal systolic function.The right ventricular size is normal. No increase in right ventricular wall thickness.  4. Left atrial size was normal.  5. Right atrial size was normal.  6. The mitral valve is normal in structure. Mild mitral valve regurgitation. No evidence of mitral stenosis.  7. The tricuspid valve is normal in structure. Tricuspid valve regurgitation is trivial.  8. The aortic valve is tricuspid Aortic valve regurgitation was not visualized by color flow Doppler. Mild aortic valve sclerosis without stenosis.  9. The pulmonic valve was normal in structure. Pulmonic valve regurgitation is not visualized by color flow Doppler. 10. Normal pulmonary artery systolic pressure. 11. The inferior vena cava is normal in size with greater than 50% respiratory variability, suggesting right atrial pressure of 3 mmHg. _____________   History of Present Illness     [name] is a 85 y.o. male with past medical history of glaucoma, HLD (no longer on medical therapy) and Stage 3 CKD who was seen for complete heart block on 09/18/19.   Chase Chase reports that  he is not overly active at baseline but still lives by himself and ambulates with a walker throughout his home. His daughter who is at the bedside says family lives close by and checks on him daily. Starting approximately 4 days ago he developed worsening dizziness and presyncope. Denies any actual syncopal episodes. Says he has been feeling more short of breath at rest and with activity. No associated chest pain or palpitations.  He did receive his flu shot 2 days ago and had nausea and vomiting after this and thought his symptoms might have been exacerbated since then. He does not take any medications daily besides Vitamin D. Not on any AV nodal blocking agents.  He is unaware of any prior cardiac history for himself including CAD, CHF or cardiac arrhythmias.  Reports his mother died from "heart issues" but is unable to elaborate on the specifics. He is a former smoker but denies any current use.    Initial labs show WBC 8.3, Hgb 16.3, platelets 209, Na+ 137, K+ 4.3, creatinine 1.27 (baseline creatinine 1.1 - 1.3). Initial and delta troponin negative at 10 and 11. CXR showing low lung volumes with atelectasis but no definitive infiltrates or effusions.  Initial EKG showed sinus bradycardia with RBBB and LAFB.  He continued to have intermittent episodes of bradycardia on telemetry which looked concerning for heart block and repeat tracing confirmed complete heart block.   On examination, he was asymptomatic and denied any discomfort or shortness of breath.  No current nausea or vomiting.  Hospital Course     Consultants: EP    Complete Heart Block and mobitz 2 second degree AV block Insertion  of PPM He presented with 4-day history of worsening dizziness, presyncope, DOE, and intermittent nausea found to be in CHB. Pt was transferred to [facility] for PPM insertion with EP: Biotronik dual chamber pacemaker.  PPM placed [id number] without complications. Follow up CXR without PTX. Device interrogation with  appropriate device function.   Echocardiogram performed and showed normal EF of 55-60%, mild LVH, diastolic dysfunction, Mild MR, and normal pulmonary artery pressure.  Seen and examined today by Dr. Caryl Comes and felt stable for discharge. All questions answered. Follow up will be made with EP and Dr. Bronson Ing.    Did the patient have an acute coronary syndrome (MI, NSTEMI, STEMI, etc) this admission?:  No                               Did the patient have a percutaneous coronary intervention (stent / angioplasty)?:  No.   _____________  Discharge Vitals Blood pressure 130/71, pulse (!) 59, temperature (!) 97.5 F (36.4 C), temperature source Oral, resp. rate 20, height 5\' 8"  (1.727 m), weight 72.8 kg, SpO2 99 %.  Filed Weights   09/18/19 2140 09/19/19 0047 09/20/19 0556  Weight: 74.7 kg 74.3 kg 72.8 kg    Labs & Radiologic Studies    CBC Recent Labs    09/18/19 1330  WBC 8.3  NEUTROABS 5.9  HGB 16.3  HCT 49.1  MCV 102.5*  PLT [id number]   Basic Metabolic Panel Recent Labs    09/18/19 1330 09/19/19 0534  NA 137 138  K 4.3 3.9  CL 102 104  CO2 23 24  GLUCOSE 115* 98  BUN 22 17  CREATININE 1.27* 1.25*  CALCIUM 9.4 8.9   Liver Function Tests Recent Labs    09/18/19 1330  AST 22  ALT 19  ALKPHOS 72  BILITOT 1.0  PROT 6.8  ALBUMIN 3.9   No results for input(s): LIPASE, AMYLASE in the last 72 hours. High Sensitivity Troponin:   Recent Labs  Lab 09/18/19 1330 09/18/19 1509  TROPONINIHS 10 11    BNP Invalid input(s): POCBNP D-Dimer No results for input(s): DDIMER in the last 72 hours. Hemoglobin A1C No results for input(s): HGBA1C in the last 72 hours. Fasting Lipid Panel No results for input(s): CHOL, HDL, LDLCALC, TRIG, CHOLHDL, LDLDIRECT in the last 72 hours. Thyroid Function Tests Recent Labs    09/19/19 0534  TSH 5.168*   _____________  Dg Chest 2 View  Result Date: 09/20/2019 CLINICAL DATA:  Postop for pacemaker placement. EXAM: CHEST - 2 VIEW  COMPARISON:  09/18/2019 FINDINGS: Lateral view degraded secondary to patient arm position and posterior artifact. Dual lead pacer, appropriately positioned. Normal heart size. Tortuous thoracic aorta. No pleural effusion or pneumothorax. No congestive failure. Mild left base subsegmental atelectasis or scar. IMPRESSION: Appropriate position of pacer, without pneumothorax or other acute complication. Electronically Signed   By: Abigail Miyamoto M.D.   On: 09/20/2019 10:21   Dg Chest 2 View  Result Date: 09/18/2019 CLINICAL DATA:  Weakness and shortness of breath. EXAM: CHEST - 2 VIEW COMPARISON:  Chest x-ray 04/08/2013 FINDINGS: The cardiac silhouette, mediastinal and hilar contours are within normal limits given the AP projection. Low lung volumes with vascular crowding and bibasilar atelectasis. No definite infiltrates or effusions. The bony thorax is intact. IMPRESSION: Low lung volumes with vascular crowding and basilar atelectasis. No definite infiltrates or effusions. Electronically Signed   By: Marijo Sanes M.D.   On: 09/18/2019  14:15   Disposition   Pt is being discharged home today in good condition.  Follow-up Plans & Appointments    Follow-up Information    Sharilyn Sites, MD.   Specialty: Family Medicine Contact information: [address] [place] [id number] [phone]        Evans Lance, MD Follow up on 12/23/2019.   Specialty: Cardiology Why: at 315 pm for 3 month pacer check Contact information: [address] [place] [address] [phone]        [facility] Follow up on 10/02/2019.   Why: at 0900 for post pacemaker wound check Contact information: [place] [phone]         Discharge Instructions    Diet - low sodium heart healthy   Complete by: As directed    Discharge instructions   Complete by: As directed     Supplemental Discharge Instructions for  Pacemaker/Defibrillator Patients   ACTIVITY Do not raise your left/right arm above shoulder level or extend it backward beyond shoulder level for 2 weeks. Wear the arm sling as a reminder or as needed for comfort for 2 weeks. No heavy lifting or vigorous activity with your left/right arm for 6-8 weeks.    NO DRIVING is preferable for 2 weeks; If absolutely necessary, drive only short, familiar routes. DO wear your seatbelt, even if it crosses over the pacemaker site.  WOUND CARE Keep the wound area clean and dry.  Remove the dressing the day after you return home (usually 48 hours after the procedure). DO NOT SUBMERGE UNDER WATER UNTIL FULLY HEALED (no tub baths, hot tubs, swimming pools, etc.).  You  may shower or take a sponge bath after the dressing is removed. DO NOT SOAK the area and do not allow the shower to directly spray on the site. If you have staples, these will be removed in the office in 7-14 days. If you have tape/steri-strips on your wound, these will fall off; do not pull them off prematurely.   No bandage is needed on the site.  DO  NOT apply any creams, oils, or ointments to the wound area. If you notice any drainage or discharge from the wound, any swelling, excessive redness or bruising at the site, or if you develop a fever > 101? F after you are discharged home, call the office at once.  SPECIAL INSTRUCTIONS You are still able to use cellular telephones.  Avoid carrying your cellular phone near your device. When traveling through airports, show security personnel your identification card to avoid being screened in the metal detectors.  Avoid arc welding equipment, MRI testing (magnetic resonance imaging), TENS units (transcutaneous nerve stimulators).  Call the office for questions about other devices. Avoid electrical appliances that are in poor condition or are not properly grounded. Microwave ovens are safe to be near or to  operate.  ADDITIONAL INFORMATION FOR DEFIBRILLATOR PATIENTS SHOULD YOUR DEVICE GO OFF: If your device goes off ONCE and you feel fine afterward, notify the clinic at [phone]. If your device goes off ONCE and you do not feel well afterward, call 911. If your device goes off TWICE or more in one day, call 911.  DO NOT DRIVE YOURSELF OR A FAMILY MEMBER WITH A DEFIBRILLATOR TO THE HOSPITAL-CALL 911.    Increase activity slowly   Complete by: As directed       Discharge Medications   Allergies as of 09/20/2019  No Known Allergies     Medication List    STOP taking these medications   oseltamivir 30 MG capsule Commonly known as: TAMIFLU     TAKE these medications   Fish Oil 645 MG Caps Take 1,290 mg by mouth daily. Take 2 capsules daily for cholesterol.   latanoprost 0.005 % ophthalmic solution Commonly known as: XALATAN Place 1 drop into both eyes at bedtime as needed.   meclizine 25 MG tablet Commonly known as: ANTIVERT Take 25 mg by mouth as needed for dizziness (rarely needs).   naproxen sodium 220 MG tablet Commonly known as: ALEVE Take 220 mg by mouth daily as needed.   PROSTATE HEALTH PO Take 2 tablets by mouth daily.   Vitamin D (Ergocalciferol) 1.25 MG (50000 UT) Caps capsule Commonly known as: DRISDOL Take 1 capsule by mouth every 30 (thirty) days.   zinc gluconate 50 MG tablet Take 50 mg by mouth daily.          Outstanding Labs/Studies   none  Duration of Discharge Encounter   Greater than 30 minutes including physician time.  Signed, [place], PA 09/20/2019, 12:25 PM

## 2019-09-20 NOTE — Progress Notes (Signed)
Patient Name: Chase Chase      SUBJECTIVE:admitted with weakness and found to have CHB and underwent pacing 09/19/19  Feels better  Past Medical History:  Diagnosis Date  . At risk of UTI   . CHB (complete heart block) ([place]) 09/2019  . Glaucoma   . Glaucoma   . HOH (hard of hearing)   . Hypercholesterolemia   . Prostate disease    unspecified  . Vertigo     Scheduled Meds:  Scheduled Meds: Continuous Infusions: . sodium chloride 250 mL (09/19/19 2219)  .  ceFAZolin (ANCEF) IV 1 g (09/19/19 2221)   sodium chloride, acetaminophen, ondansetron (ZOFRAN) IV    PHYSICAL EXAM Vitals:   09/19/19 2058 09/20/19 0038 09/20/19 0556 09/20/19 0730  BP: 113/62 108/65 124/89 132/76  Pulse: 64 63 81 60  Resp: 14 16 16 20   Temp: 98.3 F (36.8 C) 98.9 F (37.2 C) 98.4 F (36.9 C) 98.4 F (36.9 C)  TempSrc: Oral Oral Oral Oral  SpO2: 94% 95% 96% 98%  Weight:   72.8 kg   Height:          Well developed and well nourished in no acute distress HENT normal Neck supple with JVP-flat Clear Pocket without hematoma, swelling or tenderness  Regular rate and rhythm, no  gallop No  murmur Abd-soft with active BS No Clubbing cyanosis  edema Skin-warm and dry A & Oriented  Grossly normal sensory and motor function  ECG P-synchronous/ AV  pacing    TELEMETRY: Reviewed personnally pt in P-synchronous/ AV  pacing :     Intake/Output Summary (Last 24 hours) at 09/20/2019 0938 Last data filed at 09/20/2019 0807 Gross per 24 hour  Intake 592.77 ml  Output 750 ml  Net -157.23 ml    LABS: Basic Metabolic Panel: Recent Labs  Lab 09/18/19 1330 09/19/19 0534  NA 137 138  K 4.3 3.9  CL 102 104  CO2 23 24  GLUCOSE 115* 98  BUN 22 17  CREATININE 1.27* 1.25*  CALCIUM 9.4 8.9   Cardiac Enzymes: No results for input(s): CKTOTAL, CKMB, CKMBINDEX, TROPONINI in the last 72 hours. CBC: Recent Labs  Lab 09/18/19 1330  WBC 8.3  NEUTROABS 5.9  HGB 16.3   HCT 49.1  MCV 102.5*  PLT 209   PROTIME: No results for input(s): LABPROT, INR in the last 72 hours. Liver Function Tests: Recent Labs    09/18/19 1330  AST 22  ALT 19  ALKPHOS 72  BILITOT 1.0  PROT 6.8  ALBUMIN 3.9   No results for input(s): LIPASE, AMYLASE in the last 72 hours. BNP: BNP (last 3 results) No results for input(s): BNP in the last 8760 hours.  ProBNP (last 3 results) No results for input(s): PROBNP in the last 8760 hours.  D-Dimer: No results for input(s): DDIMER in the last 72 hours. Hemoglobin A1C: No results for input(s): HGBA1C in the last 72 hours. Fasting Lipid Panel: No results for input(s): CHOL, HDL, LDLCALC, TRIG, CHOLHDL, LDLDIRECT in the last 72 hours. Thyroid Function Tests: Recent Labs    09/19/19 0534  TSH 5.168*   Anemia Panel: No results for input(s): VITAMINB12, FOLATE, FERRITIN, TIBC, IRON, RETICCTPCT in the last 72 hours.   Device Interrogation: normal device function  Chest Xray personally reviewed    Good lead position ASSESSMENT AND PLAN:  Active Problems:   CHB (complete heart block) (HCC)  For discharge Instructions given and spoke with his daughter  Signed, Virl Axe MD  09/20/2019

## 2019-09-20 NOTE — Plan of Care (Signed)
  Problem: Education: Goal: Ability to demonstrate management of disease process will improve Outcome: Adequate for Discharge Goal: Ability to verbalize understanding of medication therapies will improve Outcome: Adequate for Discharge Goal: Individualized Educational Video(s) Outcome: Adequate for Discharge   Problem: Activity: Goal: Capacity to carry out activities will improve Outcome: Adequate for Discharge   Problem: Cardiac: Goal: Ability to achieve and maintain adequate cardiopulmonary perfusion will improve Outcome: Adequate for Discharge   Problem: Safety: Goal: Ability to remain free from injury will improve Outcome: Adequate for Discharge   Problem: Skin Integrity: Goal: Risk for impaired skin integrity will decrease Outcome: Adequate for Discharge

## 2019-09-22 ENCOUNTER — Encounter (HOSPITAL_COMMUNITY): Payer: Self-pay | Admitting: Internal Medicine

## 2019-09-22 MED FILL — Lidocaine HCl Local Inj 1%: INTRAMUSCULAR | Qty: 60 | Status: AC

## 2019-09-26 DIAGNOSIS — E663 Overweight: Secondary | ICD-10-CM | POA: Diagnosis not present

## 2019-09-26 DIAGNOSIS — Z6826 Body mass index (BMI) 26.0-26.9, adult: Secondary | ICD-10-CM | POA: Diagnosis not present

## 2019-09-26 DIAGNOSIS — Z1389 Encounter for screening for other disorder: Secondary | ICD-10-CM | POA: Diagnosis not present

## 2019-09-26 DIAGNOSIS — Z0001 Encounter for general adult medical examination with abnormal findings: Secondary | ICD-10-CM | POA: Diagnosis not present

## 2019-09-26 DIAGNOSIS — E039 Hypothyroidism, unspecified: Secondary | ICD-10-CM | POA: Diagnosis not present

## 2019-09-26 DIAGNOSIS — I442 Atrioventricular block, complete: Secondary | ICD-10-CM | POA: Diagnosis not present

## 2019-10-02 ENCOUNTER — Other Ambulatory Visit: Payer: Self-pay

## 2019-10-02 ENCOUNTER — Ambulatory Visit (INDEPENDENT_AMBULATORY_CARE_PROVIDER_SITE_OTHER): Payer: Medicare Other | Admitting: *Deleted

## 2019-10-02 DIAGNOSIS — I442 Atrioventricular block, complete: Secondary | ICD-10-CM

## 2019-10-02 LAB — CUP PACEART INCLINIC DEVICE CHECK
Brady Statistic RA Percent Paced: 25 %
Brady Statistic RV Percent Paced: 51 %
Date Time Interrogation Session: 20201022121247
Implantable Lead Implant Date: 20201009
Implantable Lead Implant Date: 20201009
Implantable Lead Location: 753859
Implantable Lead Location: 753860
Implantable Lead Model: 337167
Implantable Lead Model: 377171
Implantable Lead Serial Number: 81023057
Implantable Lead Serial Number: 81187700
Implantable Pulse Generator Implant Date: 20201009
Lead Channel Impedance Value: 526 Ohm
Lead Channel Impedance Value: 760 Ohm
Lead Channel Pacing Threshold Amplitude: 0.6 V
Lead Channel Pacing Threshold Amplitude: 0.8 V
Lead Channel Pacing Threshold Pulse Width: 0.4 ms
Lead Channel Pacing Threshold Pulse Width: 0.4 ms
Lead Channel Sensing Intrinsic Amplitude: 13.3 mV
Lead Channel Sensing Intrinsic Amplitude: 6 mV
Lead Channel Setting Pacing Amplitude: 1.5 V
Lead Channel Setting Pacing Amplitude: 1.6 V
Lead Channel Setting Pacing Pulse Width: 0.4 ms
Pulse Gen Model: 407145
Pulse Gen Serial Number: 69608254

## 2019-10-02 NOTE — Patient Instructions (Signed)
Call office if you develop redness, drainage or swelling at incision site.

## 2019-10-02 NOTE — Progress Notes (Signed)
Wound check appointment. Steri-strips removed. Wound without redness or edema. Incision edges approximated, wound well healed. Normal device function. Thresholds, sensing, and impedances consistent with implant measurements. Device programmed at 3.5V/auto capture programmed on for extra safety margin until 3 month visit. Histogram distribution appropriate for patient and level of activity. No mode switches or high ventricular rates noted. Patient educated about wound care, arm mobility, lifting restrictions. ROV in 3 months with Dr Lovena Le on 12/23/19. Next remote scheduled for 03/29/20.

## 2019-10-06 ENCOUNTER — Encounter (HOSPITAL_COMMUNITY): Payer: Self-pay | Admitting: Internal Medicine

## 2019-11-11 DIAGNOSIS — Z6826 Body mass index (BMI) 26.0-26.9, adult: Secondary | ICD-10-CM | POA: Diagnosis not present

## 2019-11-11 DIAGNOSIS — E039 Hypothyroidism, unspecified: Secondary | ICD-10-CM | POA: Diagnosis not present

## 2019-11-11 DIAGNOSIS — E663 Overweight: Secondary | ICD-10-CM | POA: Diagnosis not present

## 2019-11-11 DIAGNOSIS — Z1389 Encounter for screening for other disorder: Secondary | ICD-10-CM | POA: Diagnosis not present

## 2019-11-11 DIAGNOSIS — Z0001 Encounter for general adult medical examination with abnormal findings: Secondary | ICD-10-CM | POA: Diagnosis not present

## 2019-12-23 ENCOUNTER — Encounter: Payer: Medicare Other | Admitting: Internal Medicine

## 2020-01-11 ENCOUNTER — Ambulatory Visit: Payer: Medicare Other

## 2020-01-17 ENCOUNTER — Telehealth: Payer: Self-pay | Admitting: Internal Medicine

## 2020-01-17 ENCOUNTER — Ambulatory Visit: Payer: Medicare Other | Attending: Internal Medicine

## 2020-01-17 DIAGNOSIS — Z23 Encounter for immunization: Secondary | ICD-10-CM

## 2020-01-17 NOTE — Telephone Encounter (Signed)
Patient received first dose of COVID-19 vaccination. Vaccine risk reviewed. Verbal consent obtained.

## 2020-01-17 NOTE — Progress Notes (Signed)
   Covid-19 Vaccination Clinic  Name:  DEVESH BERTAGNOLLI    MRN: [id number] DOB: [birth year]  01/17/2020  Mr. Bolivar was observed post Covid-19 immunization for 15 minutes without incidence. He was provided with Vaccine Information Sheet and instruction to access the V-Safe system.   Mr. Thormahlen was instructed to call 911 with any severe reactions post vaccine: Marland Kitchen Difficulty breathing  . Swelling of your face and throat  . A fast heartbeat  . A bad rash all over your body  . Dizziness and weakness    Immunizations Administered    Name Date Dose VIS Date Route   Pfizer COVID-19 Vaccine 01/17/2020  8:23 AM 0.3 mL 11/21/2019 Intramuscular   Manufacturer: [place]   Lot: [id number]   [place]: [id number]

## 2020-01-21 ENCOUNTER — Telehealth: Payer: Self-pay | Admitting: Internal Medicine

## 2020-01-21 NOTE — Telephone Encounter (Signed)
New Message   Patients daughter is calling to advise that she would need to come to appt with father because he uses a walker.

## 2020-01-21 NOTE — Telephone Encounter (Signed)
Pts daughter will help the pt come in for his appt with Dr. Lovena Le 01/23/20.. he needs assistance with ambulation.

## 2020-01-23 ENCOUNTER — Encounter: Payer: Self-pay | Admitting: Internal Medicine

## 2020-01-23 ENCOUNTER — Other Ambulatory Visit: Payer: Self-pay

## 2020-01-23 ENCOUNTER — Ambulatory Visit (INDEPENDENT_AMBULATORY_CARE_PROVIDER_SITE_OTHER): Payer: Medicare Other | Admitting: Internal Medicine

## 2020-01-23 VITALS — BP 128/78 | HR 60 | Ht 68.0 in | Wt 172.4 lb

## 2020-01-23 DIAGNOSIS — I442 Atrioventricular block, complete: Secondary | ICD-10-CM | POA: Diagnosis not present

## 2020-01-23 DIAGNOSIS — Z950 Presence of cardiac pacemaker: Secondary | ICD-10-CM

## 2020-01-23 LAB — CUP PACEART INCLINIC DEVICE CHECK
Brady Statistic RA Percent Paced: 58 %
Brady Statistic RV Percent Paced: 6 %
Date Time Interrogation Session: 20210212173832
Implantable Lead Implant Date: 20201009
Implantable Lead Implant Date: 20201009
Implantable Lead Location: 753859
Implantable Lead Location: 753860
Implantable Lead Model: 377
Implantable Lead Model: 377
Implantable Lead Serial Number: 81023057
Implantable Lead Serial Number: 81187700
Implantable Pulse Generator Implant Date: 20201009
Lead Channel Impedance Value: 565 Ohm
Lead Channel Impedance Value: 975 Ohm
Lead Channel Pacing Threshold Amplitude: 0.5 V
Lead Channel Pacing Threshold Amplitude: 0.6 V
Lead Channel Pacing Threshold Pulse Width: 0.4 ms
Lead Channel Pacing Threshold Pulse Width: 0.4 ms
Lead Channel Sensing Intrinsic Amplitude: 13.9 mV
Lead Channel Sensing Intrinsic Amplitude: 4.1 mV
Lead Channel Setting Pacing Amplitude: 1 V
Lead Channel Setting Pacing Amplitude: 1.6 V
Lead Channel Setting Pacing Pulse Width: 0.4 ms
Pulse Gen Model: 407145
Pulse Gen Serial Number: 69608254

## 2020-01-23 NOTE — Patient Instructions (Signed)
Medication Instructions:  Your physician recommends that you continue on your current medications as directed. Please refer to the Current Medication list given to you today.  Labwork: None ordered.  Testing/Procedures: None ordered.  Follow-Up: Your physician wants you to follow-up in: one year with Dr. Lovena Le.   You will receive a reminder letter in the mail two months in advance. If you don't receive a letter, please call our office to schedule the follow-up appointment.  Remote monitoring is used to monitor your Pacemaker from home. This monitoring reduces the number of office visits required to check your device to one time per year. It allows [place] to keep an eye on the functioning of your device to ensure it is working properly. You are scheduled for a device check from home on 03/29/2020. You may send your transmission at any time that day. If you have a wireless device, the transmission will be sent automatically. After your physician reviews your transmission, you will receive a postcard with your next transmission date.  Any Other Special Instructions Will Be Listed Below (If Applicable).  If you need a refill on your cardiac medications before your next appointment, please call your pharmacy.

## 2020-01-23 NOTE — Progress Notes (Signed)
HPI [name] returns today for followup after presenting several months ago with CHB, s/p PPM insertion. He has done well in the interim, despite his advanced age. He has not had syncope. He has had amazingly good cardiac health until presenting with CHB.  No Known Allergies   Current Outpatient Medications  Medication Sig Dispense Refill  . latanoprost (XALATAN) 0.005 % ophthalmic solution Place 1 drop into both eyes at bedtime as needed.     Marland Kitchen levothyroxine (SYNTHROID) 50 MCG tablet Take 50 mcg by mouth daily.    . meclizine (ANTIVERT) 25 MG tablet Take 25 mg by mouth as needed for dizziness (rarely needs).     . Misc Natural Products (PROSTATE HEALTH PO) Take 2 tablets by mouth daily.    . naproxen sodium (ALEVE) 220 MG tablet Take 220 mg by mouth daily as needed.    . Omega-3 Fatty Acids (FISH OIL) 645 MG CAPS Take 1,290 mg by mouth daily. Take 2 capsules daily for cholesterol.    . Vitamin D, Ergocalciferol, (DRISDOL) 1.25 MG (50000 UT) CAPS capsule Take 1 capsule by mouth every 30 (thirty) days.    Marland Kitchen zinc gluconate 50 MG tablet Take 50 mg by mouth daily.     No current facility-administered medications for this visit.     Past Medical History:  Diagnosis Date  . At risk of UTI   . CHB (complete heart block) ([place]) 09/2019  . CHB (complete heart block) ([place]) 09/2019  . Glaucoma   . Glaucoma   . HOH (hard of hearing)   . Hypercholesterolemia   . Pacemaker 09/19/2019   Biotronik dual chamber PPM  . Prostate disease    unspecified  . Vertigo     ROS:   All systems reviewed and negative except as noted in the HPI.   Past Surgical History:  Procedure Laterality Date  . CHOLECYSTECTOMY    . HERNIA REPAIR     bilateral  . PACEMAKER IMPLANT N/A 09/19/2019   Procedure: PACEMAKER IMPLANT;  Surgeon: Evans Lance, MD;  Location: [facility];  Service: Cardiovascular;  Laterality: N/A;     Family History  Problem Relation Age of Onset  . Breast cancer  Sister   . Lung cancer Brother   . Lung cancer Brother   . Lung cancer Brother   . Breast cancer Sister   . Breast cancer Sister      Social History   Socioeconomic History  . Marital status: Widowed    Spouse name: Not on file  . Number of children: Not on file  . Years of education: Not on file  . Highest education level: Not on file  Occupational History  . Occupation: retired  Tobacco Use  . Smoking status: Former Smoker    Packs/day: 2.00    Years: 8.00    Pack years: 16.00    Types: Cigarettes  . Smokeless tobacco: Former Systems developer    Types: Chew    Quit date: 10/12/2011  . Tobacco comment: Chewing and snuff  Substance and Sexual Activity  . Alcohol use: No  . Drug use: No  . Sexual activity: Not on file  Other Topics Concern  . Not on file  Social History Narrative  . Not on file   Social Determinants of Health   Financial Resource Strain:   . Difficulty of Paying Living Expenses: Not on file  Food Insecurity:   . Worried About Charity fundraiser in the  Last Year: Not on file  . Ran Out of Food in the Last Year: Not on file  Transportation Needs:   . Lack of Transportation (Medical): Not on file  . Lack of Transportation (Non-Medical): Not on file  Physical Activity:   . Days of Exercise per Week: Not on file  . Minutes of Exercise per Session: Not on file  Stress:   . Feeling of Stress : Not on file  Social Connections:   . Frequency of Communication with Friends and Family: Not on file  . Frequency of Social Gatherings with Friends and Family: Not on file  . Attends Religious Services: Not on file  . Active Member of Clubs or Organizations: Not on file  . Attends Archivist Meetings: Not on file  . Marital Status: Not on file  Intimate Partner Violence:   . Fear of Current or Ex-Partner: Not on file  . Emotionally Abused: Not on file  . Physically Abused: Not on file  . Sexually Abused: Not on file     BP 128/78   Pulse 60   Ht 5\' 8"   (1.727 m)   Wt 172 lb 6.4 oz (78.2 kg)   SpO2 97%   BMI 26.21 kg/m   Physical Exam:  Well appearing NAD HEENT: Unremarkable Neck:  No JVD, no thyromegally Lymphatics:  No adenopathy Back:  No CVA tenderness Lungs:  Clear with no wheezes HEART:  Regular rate rhythm, no murmurs, no rubs, no clicks Abd:  soft, positive bowel sounds, no organomegally, no rebound, no guarding Ext:  2 plus pulses, no edema, no cyanosis, no clubbing Skin:  No rashes no nodules Neuro:  CN II through XII intact, motor grossly intact  EKG - nsr with left axis and RBBB  DEVICE  Normal device function.  See PaceArt for details.   Assess/Plan: 1. CHB - he is maintaining NSR with mostly intact AV conduction. 2. PPM - his Biotronik DDD PM is working normally. We will recheck in several months.  3. Elevated bp - his bp is controlled. No change in her meds.   Mikle Bosworth.D.

## 2020-02-01 ENCOUNTER — Ambulatory Visit: Payer: Medicare Other

## 2020-02-10 ENCOUNTER — Ambulatory Visit: Payer: Medicare Other | Attending: Internal Medicine

## 2020-02-10 DIAGNOSIS — Z23 Encounter for immunization: Secondary | ICD-10-CM | POA: Insufficient documentation

## 2020-02-10 NOTE — Progress Notes (Signed)
   Covid-19 Vaccination Clinic  Name:  Chase Chase    MRN: [id number] DOB: [birth year]  02/10/2020  Chase Chase was observed post Covid-19 immunization for 15 minutes without incident. He was provided with Vaccine Information Sheet and instruction to access the V-Safe system.   Chase Chase was instructed to call 911 with any severe reactions post vaccine: Marland Kitchen Difficulty breathing  . Swelling of face and throat  . A fast heartbeat  . A bad rash all over body  . Dizziness and weakness   Immunizations Administered    Name Date Dose VIS Date Route   Pfizer COVID-19 Vaccine 02/10/2020  8:37 AM 0.3 mL 11/21/2019 Intramuscular   Manufacturer: [place]   Lot: [id number]   [place]: [id number]

## 2020-03-08 DIAGNOSIS — B351 Tinea unguium: Secondary | ICD-10-CM | POA: Diagnosis not present

## 2020-03-08 DIAGNOSIS — M79675 Pain in left toe(s): Secondary | ICD-10-CM | POA: Diagnosis not present

## 2020-03-08 DIAGNOSIS — L851 Acquired keratosis [keratoderma] palmaris et plantaris: Secondary | ICD-10-CM | POA: Diagnosis not present

## 2020-03-08 DIAGNOSIS — I739 Peripheral vascular disease, unspecified: Secondary | ICD-10-CM | POA: Diagnosis not present

## 2020-03-29 ENCOUNTER — Ambulatory Visit (INDEPENDENT_AMBULATORY_CARE_PROVIDER_SITE_OTHER): Payer: Medicare Other | Admitting: *Deleted

## 2020-03-29 DIAGNOSIS — I442 Atrioventricular block, complete: Secondary | ICD-10-CM

## 2020-03-29 LAB — CUP PACEART REMOTE DEVICE CHECK
Date Time Interrogation Session: 20210419110727
Implantable Lead Implant Date: 20201009
Implantable Lead Implant Date: 20201009
Implantable Lead Location: 753859
Implantable Lead Location: 753860
Implantable Lead Model: 377
Implantable Lead Model: 377
Implantable Lead Serial Number: 81023057
Implantable Lead Serial Number: 81187700
Implantable Pulse Generator Implant Date: 20201009
Pulse Gen Model: 407145
Pulse Gen Serial Number: 69608254

## 2020-03-29 NOTE — Progress Notes (Signed)
PPM Remote  

## 2020-06-08 IMAGING — DX DG CHEST 2V
2 series · 2 of 2 positions shown · non-contrast
Comparison: Chest x-ray 04/08/2013

CLINICAL DATA: Weakness and shortness of breath.

EXAM:
CHEST - 2 VIEW

[chest lat]
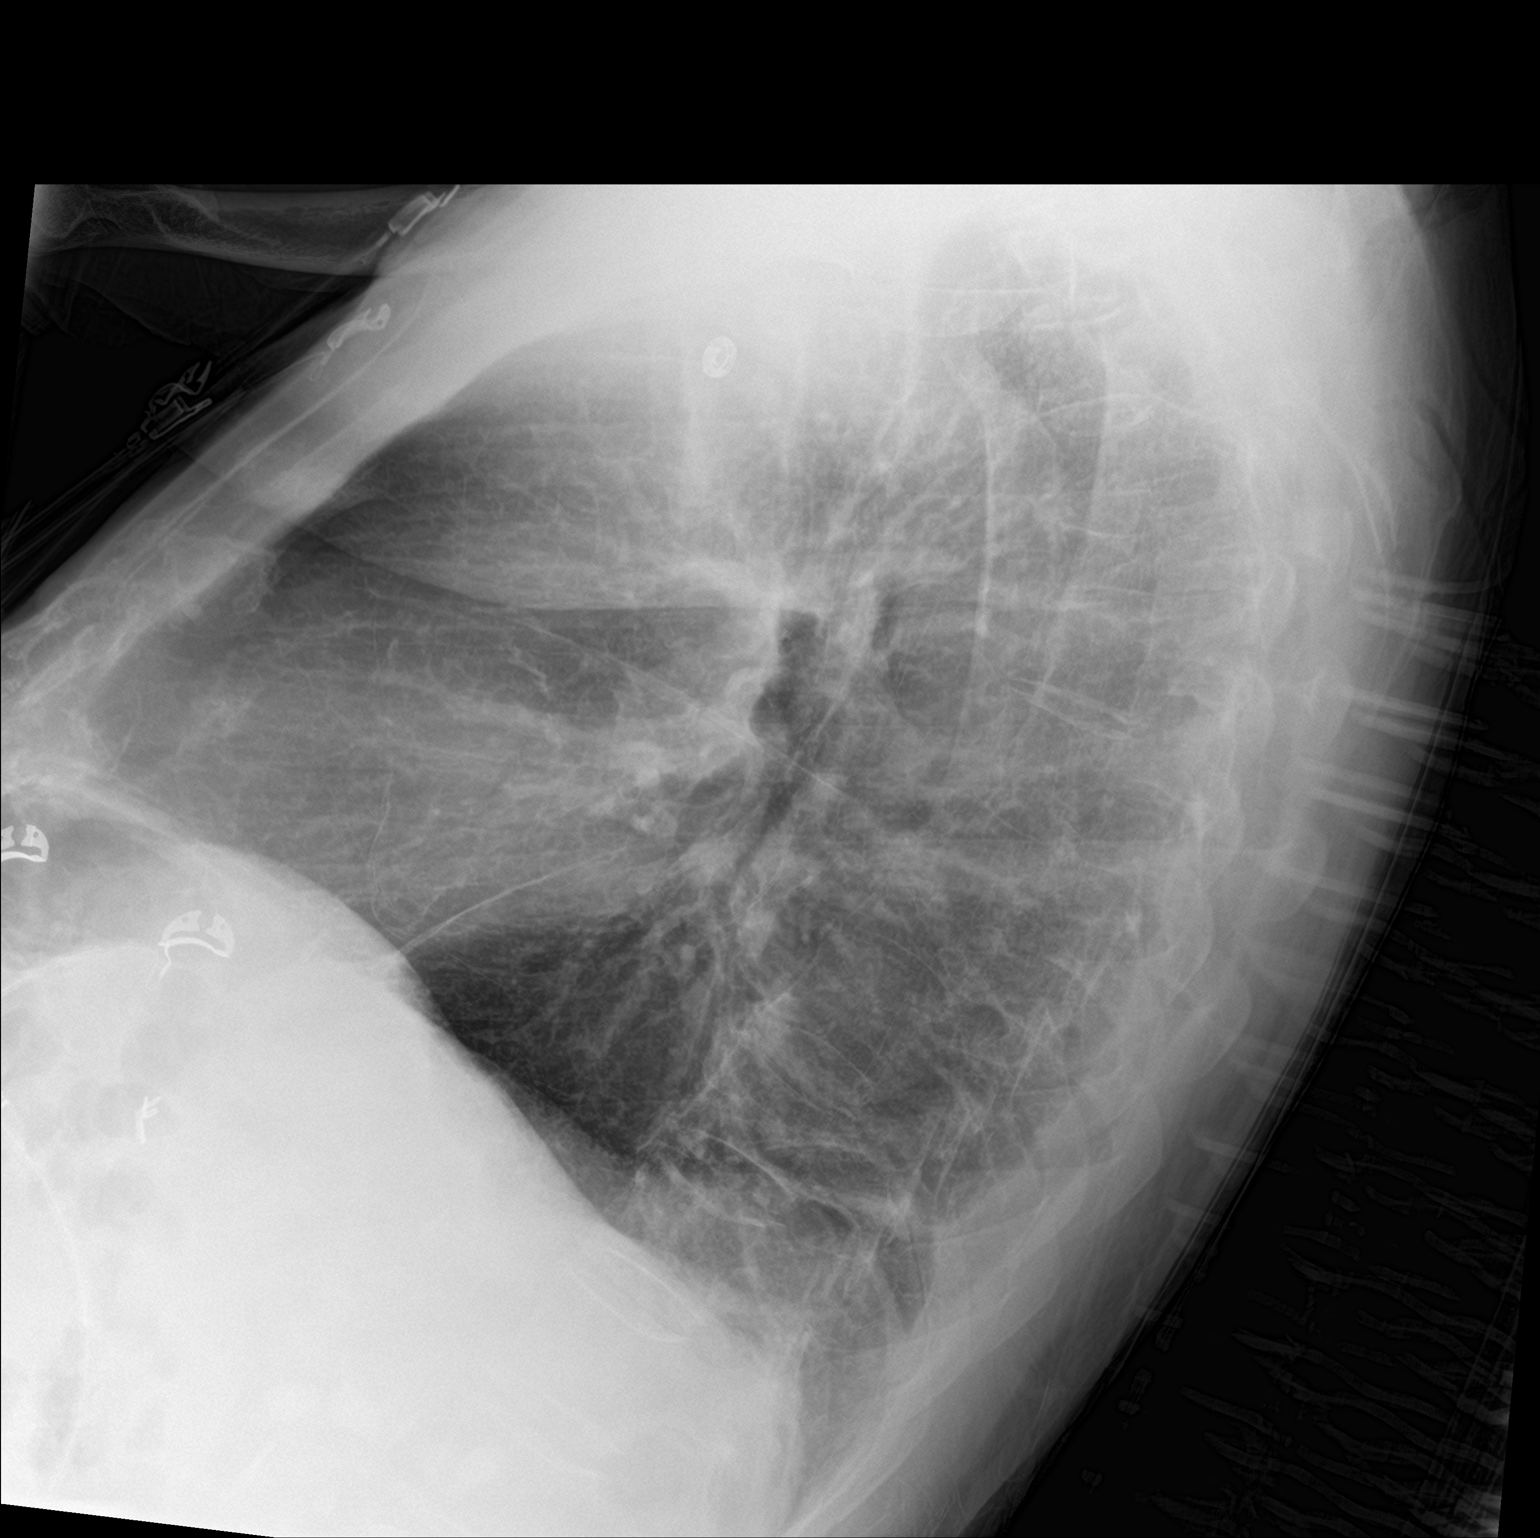

[chest ap]
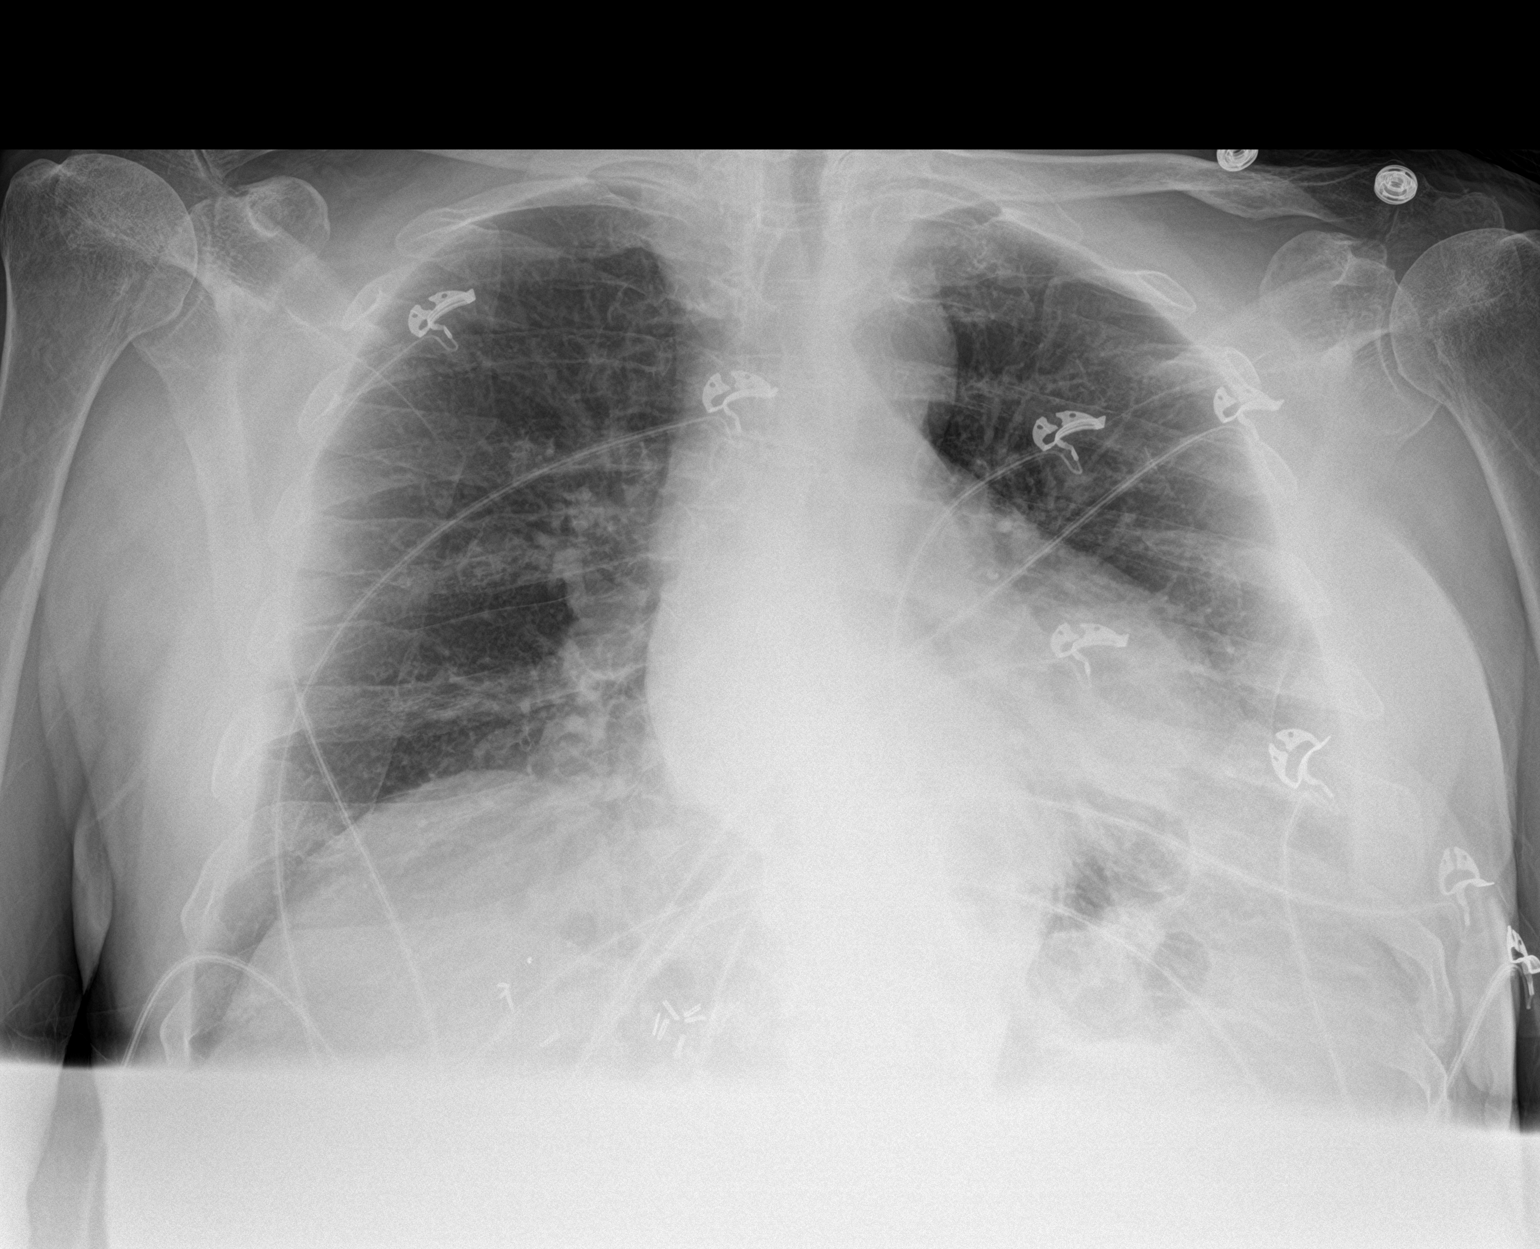

[2 of 2 positions shown; findings below may reference images not displayed]

FINDINGS: The cardiac silhouette, mediastinal and hilar contours are within
normal limits given the AP projection. Low lung volumes with
vascular crowding and bibasilar atelectasis. No definite infiltrates
or effusions. The bony thorax is intact.
IMPRESSION: Low lung volumes with vascular crowding and basilar atelectasis.

No definite infiltrates or effusions.

## 2020-06-10 IMAGING — CR DG CHEST 2V
2 series · 3 of 3 positions shown · non-contrast
Comparison: 09/18/2019

CLINICAL DATA: Postop for pacemaker placement.

EXAM:
CHEST - 2 VIEW

[Series 2: chest lat · 0.14mm/px · 2 of 2 slices shown]
[im 1/2]
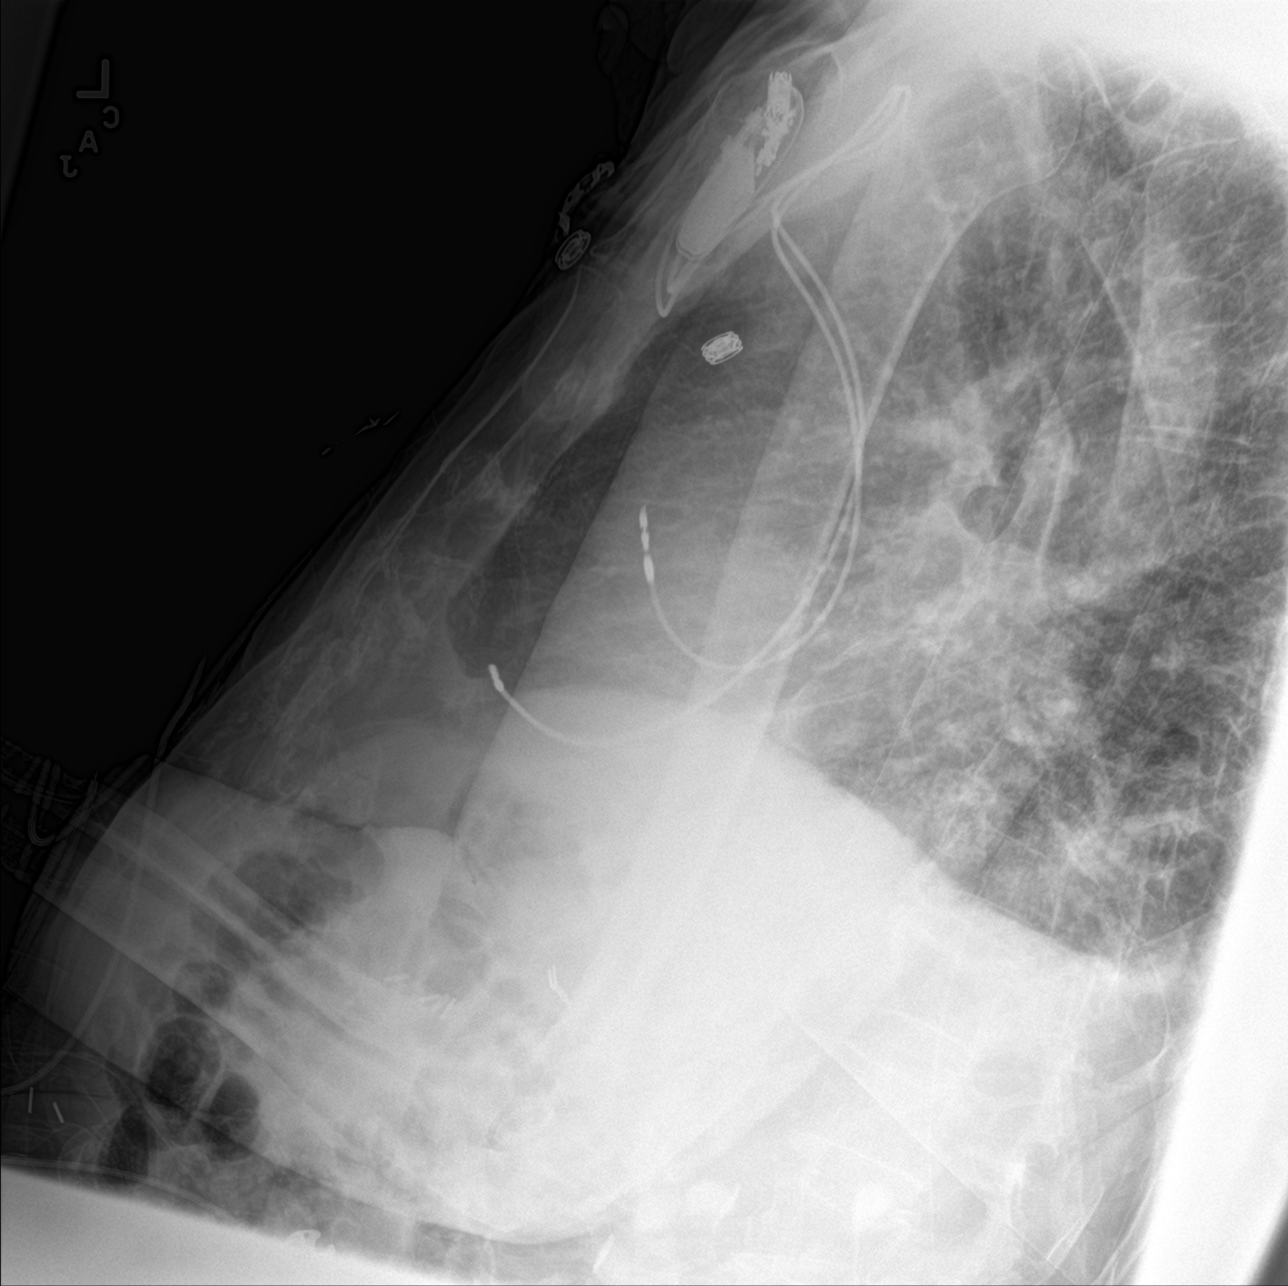
[im 2/2]
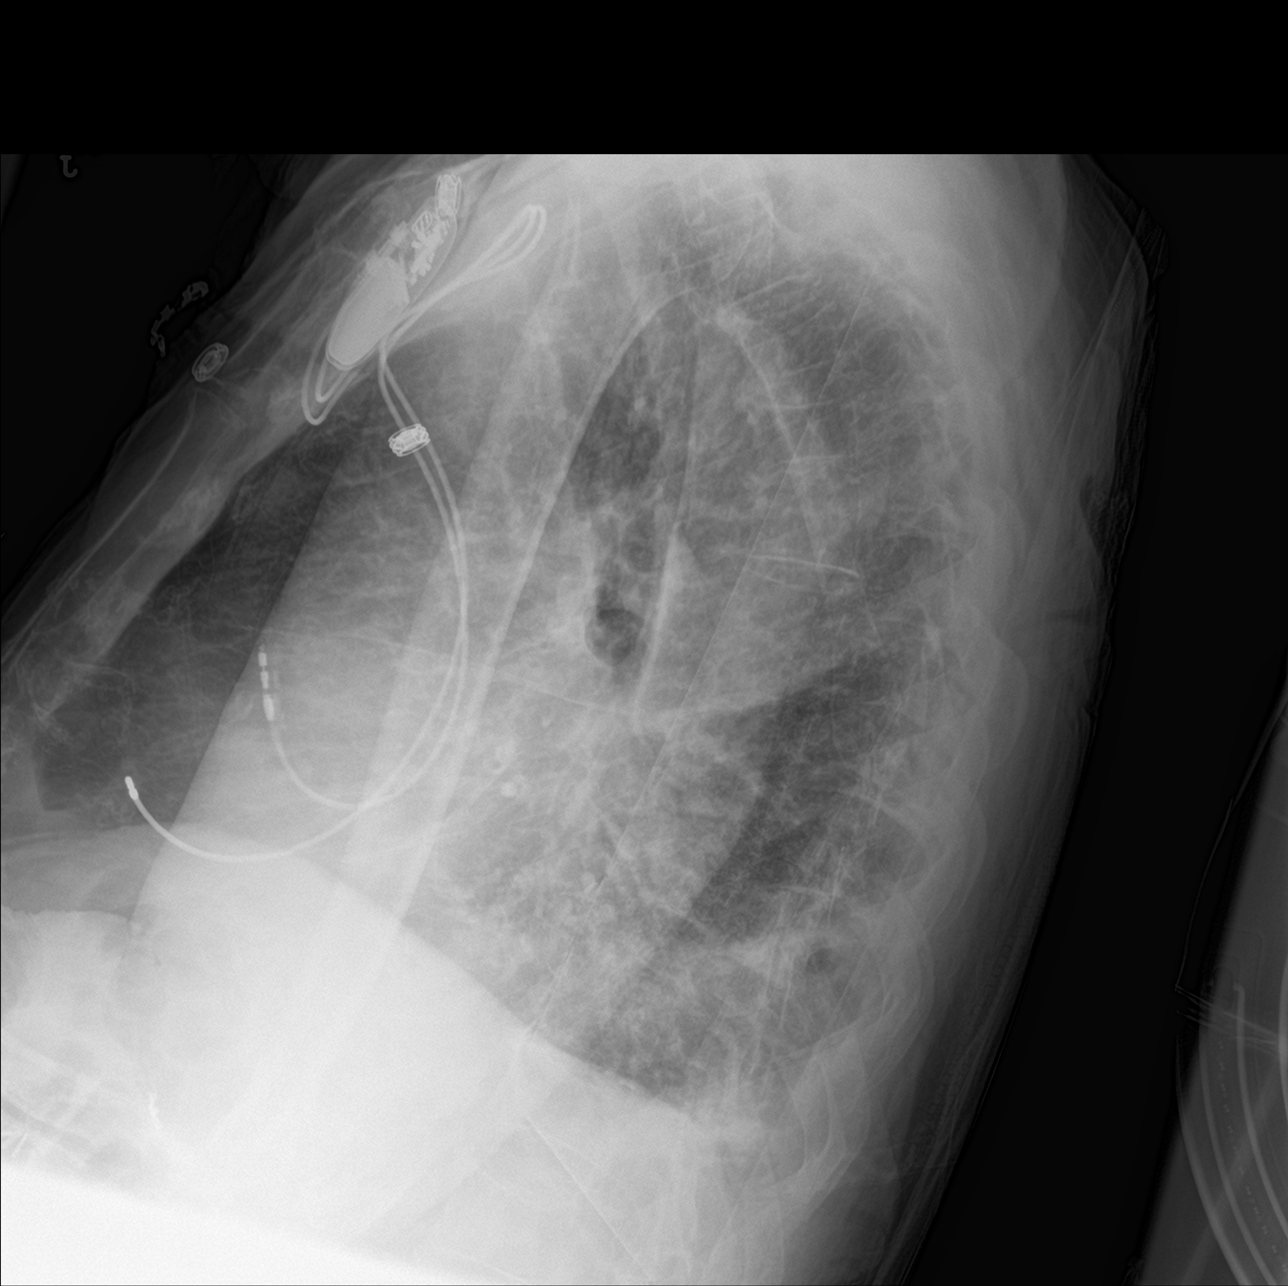

[chest ap]
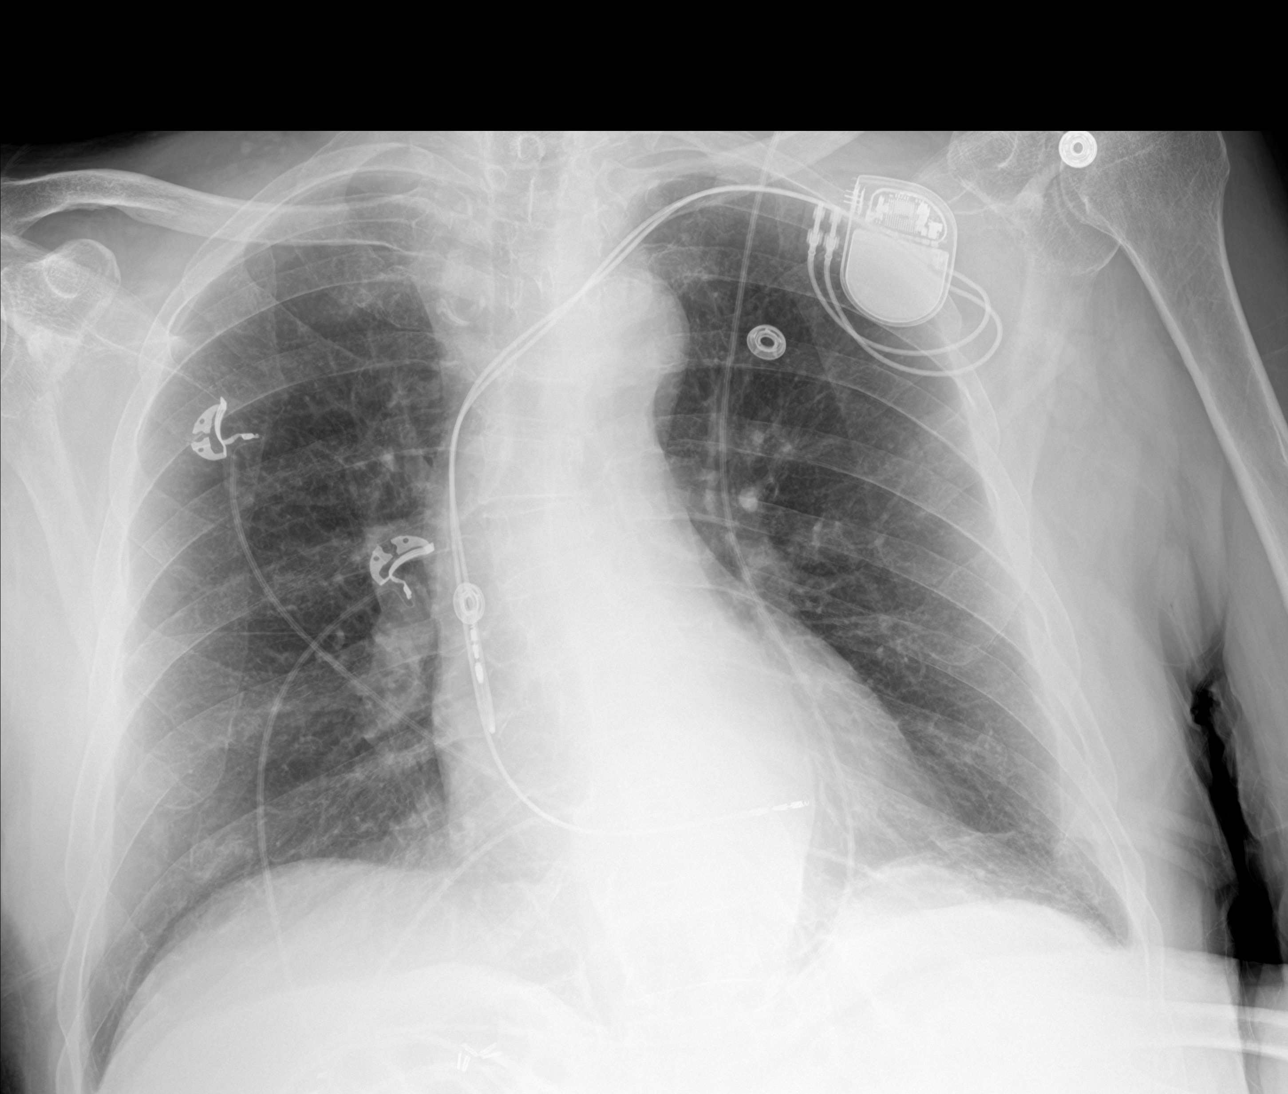

[3 of 3 positions shown; findings below may reference images not displayed]

FINDINGS: Lateral view degraded secondary to patient arm position and
posterior artifact.

Dual lead pacer, appropriately positioned. Normal heart size.
Tortuous thoracic aorta. No pleural effusion or pneumothorax. No
congestive failure. Mild left base subsegmental atelectasis or scar.
IMPRESSION: Appropriate position of pacer, without pneumothorax or other acute
complication.

## 2020-06-24 DIAGNOSIS — D0462 Carcinoma in situ of skin of left upper limb, including shoulder: Secondary | ICD-10-CM | POA: Diagnosis not present

## 2020-06-24 DIAGNOSIS — X32XXXD Exposure to sunlight, subsequent encounter: Secondary | ICD-10-CM | POA: Diagnosis not present

## 2020-06-24 DIAGNOSIS — L570 Actinic keratosis: Secondary | ICD-10-CM | POA: Diagnosis not present

## 2020-06-28 ENCOUNTER — Ambulatory Visit (INDEPENDENT_AMBULATORY_CARE_PROVIDER_SITE_OTHER): Payer: Medicare Other | Admitting: *Deleted

## 2020-06-28 DIAGNOSIS — I442 Atrioventricular block, complete: Secondary | ICD-10-CM | POA: Diagnosis not present

## 2020-06-28 LAB — CUP PACEART REMOTE DEVICE CHECK
Date Time Interrogation Session: 20210719100325
Implantable Lead Implant Date: 20201009
Implantable Lead Implant Date: 20201009
Implantable Lead Location: 753859
Implantable Lead Location: 753860
Implantable Lead Model: 377
Implantable Lead Model: 377
Implantable Lead Serial Number: 81023057
Implantable Lead Serial Number: 81187700
Implantable Pulse Generator Implant Date: 20201009
Pulse Gen Model: 407145
Pulse Gen Serial Number: 69608254

## 2020-06-30 NOTE — Progress Notes (Signed)
Remote pacemaker transmission.   

## 2020-08-23 DIAGNOSIS — R531 Weakness: Secondary | ICD-10-CM | POA: Diagnosis not present

## 2020-08-23 DIAGNOSIS — Z6827 Body mass index (BMI) 27.0-27.9, adult: Secondary | ICD-10-CM | POA: Diagnosis not present

## 2020-08-23 DIAGNOSIS — Z741 Need for assistance with personal care: Secondary | ICD-10-CM | POA: Diagnosis not present

## 2020-08-23 DIAGNOSIS — E663 Overweight: Secondary | ICD-10-CM | POA: Diagnosis not present

## 2020-09-23 DIAGNOSIS — L309 Dermatitis, unspecified: Secondary | ICD-10-CM | POA: Diagnosis not present

## 2020-09-23 DIAGNOSIS — B353 Tinea pedis: Secondary | ICD-10-CM | POA: Diagnosis not present

## 2020-09-27 ENCOUNTER — Ambulatory Visit (INDEPENDENT_AMBULATORY_CARE_PROVIDER_SITE_OTHER): Payer: Medicare Other

## 2020-09-27 DIAGNOSIS — I442 Atrioventricular block, complete: Secondary | ICD-10-CM

## 2020-09-28 LAB — CUP PACEART REMOTE DEVICE CHECK
Date Time Interrogation Session: 20211019095511
Implantable Lead Implant Date: 20201009
Implantable Lead Implant Date: 20201009
Implantable Lead Location: 753859
Implantable Lead Location: 753860
Implantable Lead Model: 377
Implantable Lead Model: 377
Implantable Lead Serial Number: 81023057
Implantable Lead Serial Number: 81187700
Implantable Pulse Generator Implant Date: 20201009
Pulse Gen Model: 407145
Pulse Gen Serial Number: 69608254

## 2020-10-01 NOTE — Progress Notes (Signed)
Remote pacemaker transmission.   

## 2020-10-09 DIAGNOSIS — Z23 Encounter for immunization: Secondary | ICD-10-CM | POA: Diagnosis not present

## 2020-10-25 DIAGNOSIS — B351 Tinea unguium: Secondary | ICD-10-CM | POA: Diagnosis not present

## 2020-10-25 DIAGNOSIS — M79675 Pain in left toe(s): Secondary | ICD-10-CM | POA: Diagnosis not present

## 2020-10-25 DIAGNOSIS — I739 Peripheral vascular disease, unspecified: Secondary | ICD-10-CM | POA: Diagnosis not present

## 2020-11-06 DIAGNOSIS — Z23 Encounter for immunization: Secondary | ICD-10-CM | POA: Diagnosis not present

## 2020-12-27 ENCOUNTER — Ambulatory Visit (INDEPENDENT_AMBULATORY_CARE_PROVIDER_SITE_OTHER): Payer: Medicare Other

## 2020-12-27 DIAGNOSIS — I442 Atrioventricular block, complete: Secondary | ICD-10-CM

## 2020-12-28 LAB — CUP PACEART REMOTE DEVICE CHECK
Date Time Interrogation Session: 20220114120513
Implantable Lead Implant Date: 20201009
Implantable Lead Implant Date: 20201009
Implantable Lead Location: 753859
Implantable Lead Location: 753860
Implantable Lead Model: 377
Implantable Lead Model: 377
Implantable Lead Serial Number: 81023057
Implantable Lead Serial Number: 81187700
Implantable Pulse Generator Implant Date: 20201009
Pulse Gen Model: 407145
Pulse Gen Serial Number: 69608254

## 2021-01-03 DIAGNOSIS — M79675 Pain in left toe(s): Secondary | ICD-10-CM | POA: Diagnosis not present

## 2021-01-03 DIAGNOSIS — I739 Peripheral vascular disease, unspecified: Secondary | ICD-10-CM | POA: Diagnosis not present

## 2021-01-03 DIAGNOSIS — B351 Tinea unguium: Secondary | ICD-10-CM | POA: Diagnosis not present

## 2021-01-04 DIAGNOSIS — H401131 Primary open-angle glaucoma, bilateral, mild stage: Secondary | ICD-10-CM | POA: Diagnosis not present

## 2021-01-10 NOTE — Progress Notes (Signed)
Remote pacemaker transmission.   

## 2021-02-01 ENCOUNTER — Other Ambulatory Visit: Payer: Self-pay

## 2021-02-01 ENCOUNTER — Encounter: Payer: Self-pay | Admitting: Internal Medicine

## 2021-02-01 ENCOUNTER — Ambulatory Visit (INDEPENDENT_AMBULATORY_CARE_PROVIDER_SITE_OTHER): Payer: Medicare Other | Admitting: Internal Medicine

## 2021-02-01 VITALS — BP 110/68 | HR 69 | Ht 68.0 in | Wt 174.0 lb

## 2021-02-01 DIAGNOSIS — I442 Atrioventricular block, complete: Secondary | ICD-10-CM

## 2021-02-01 DIAGNOSIS — Z950 Presence of cardiac pacemaker: Secondary | ICD-10-CM | POA: Diagnosis not present

## 2021-02-01 NOTE — Progress Notes (Signed)
HPI Mr. Boldman returns today for followup after presenting 16 months  ago with CHB, s/p PPM insertion. He has done well in the interim, despite his advanced age. He has not had syncope. He has had amazingly good cardiac health until presenting with CHB. He remains active despite his advanced age.  No Known Allergies   Current Outpatient Medications  Medication Sig Dispense Refill  . latanoprost (XALATAN) 0.005 % ophthalmic solution Place 1 drop into both eyes at bedtime as needed.     Marland Kitchen levothyroxine (SYNTHROID) 50 MCG tablet Take 50 mcg by mouth daily.    . meclizine (ANTIVERT) 25 MG tablet Take 25 mg by mouth as needed for dizziness (rarely needs).     . Misc Natural Products (PROSTATE HEALTH PO) Take 2 tablets by mouth daily.    . naproxen sodium (ALEVE) 220 MG tablet Take 220 mg by mouth daily as needed.    . Omega-3 Fatty Acids (FISH OIL) 645 MG CAPS Take 1,290 mg by mouth daily. Take 2 capsules daily for cholesterol.    . Vitamin D, Ergocalciferol, (DRISDOL) 1.25 MG (50000 UT) CAPS capsule Take 1 capsule by mouth every 30 (thirty) days.    Marland Kitchen zinc gluconate 50 MG tablet Take 50 mg by mouth daily.     No current facility-administered medications for this visit.     Past Medical History:  Diagnosis Date  . At risk of UTI   . CHB (complete heart block) ([place]) 09/2019  . CHB (complete heart block) ([place]) 09/2019  . Glaucoma   . Glaucoma   . HOH (hard of hearing)   . Hypercholesterolemia   . Pacemaker 09/19/2019   Biotronik dual chamber PPM  . Prostate disease    unspecified  . Vertigo     ROS:   All systems reviewed and negative except as noted in the HPI.   Past Surgical History:  Procedure Laterality Date  . CHOLECYSTECTOMY    . HERNIA REPAIR     bilateral  . PACEMAKER IMPLANT N/A 09/19/2019   Procedure: PACEMAKER IMPLANT;  Surgeon: Evans Lance, MD;  Location: [facility];  Service: Cardiovascular;  Laterality: N/A;     Family History  Problem  Relation Age of Onset  . Breast cancer Sister   . Lung cancer Brother   . Lung cancer Brother   . Lung cancer Brother   . Breast cancer Sister   . Breast cancer Sister      Social History   Socioeconomic History  . Marital status: Widowed    Spouse name: Not on file  . Number of children: Not on file  . Years of education: Not on file  . Highest education level: Not on file  Occupational History  . Occupation: retired  Tobacco Use  . Smoking status: Former Smoker    Packs/day: 2.00    Years: 8.00    Pack years: 16.00    Types: Cigarettes  . Smokeless tobacco: Former Systems developer    Types: Chew    Quit date: 10/12/2011  . Tobacco comment: Chewing and snuff  Vaping Use  . Vaping Use: Never used  Substance and Sexual Activity  . Alcohol use: No  . Drug use: No  . Sexual activity: Not on file  Other Topics Concern  . Not on file  Social History Narrative  . Not on file   Social Determinants of Health   Financial Resource Strain: Not on file  Food Insecurity: Not on  file  Transportation Needs: Not on file  Physical Activity: Not on file  Stress: Not on file  Social Connections: Not on file  Intimate Partner Violence: Not on file     BP 110/68   Pulse 69   Ht 5\' 8"  (1.727 m)   Wt 174 lb (78.9 kg)   SpO2 96%   BMI 26.46 kg/m   Physical Exam:  Well appearing NAD HEENT: Unremarkable Neck:  No JVD, no thyromegally Lymphatics:  No adenopathy Back:  No CVA tenderness Lungs:  Clear HEART:  Regular rate rhythm, no murmurs, no rubs, no clicks Abd:  soft, positive bowel sounds, no organomegally, no rebound, no guarding Ext:  2 plus pulses, no edema, no cyanosis, no clubbing Skin:  No rashes no nodules Neuro:  CN II through XII intact, motor grossly intact  EKG - NSR with RBBB  DEVICE  Normal device function.  See PaceArt for details.   Assess/Plan: 1. CHB - he is maintaining NSR with mostly intact AV conduction. 2. PPM - his Biotronik DDD PM is working  normally. We will recheck in several months.  3. Elevated bp - his bp is controlled. No change in her meds.   Carleene Overlie Taylor,MD

## 2021-02-01 NOTE — Patient Instructions (Signed)
Medication Instructions:  Your physician recommends that you continue on your current medications as directed. Please refer to the Current Medication list given to you today.  Labwork: None ordered.  Testing/Procedures: None ordered.  Follow-Up: Your physician wants you to follow-up in: one year with Cristopher Peru, MD or one of the following Advanced Practice Providers on your designated Care Team:    Chanetta Marshall, NP  [name], PA-C  [name] "Jonni Sanger" [place], [place]  Remote monitoring is used to monitor your Pacemaker from home. This monitoring reduces the number of office visits required to check your device to one time per year. It allows [place] to keep an eye on the functioning of your device to ensure it is working properly. You are scheduled for a device check from home on 03/28/2021. You may send your transmission at any time that day. If you have a wireless device, the transmission will be sent automatically. After your physician reviews your transmission, you will receive a postcard with your next transmission date.  Any Other Special Instructions Will Be Listed Below (If Applicable).  If you need a refill on your cardiac medications before your next appointment, please call your pharmacy.

## 2021-02-17 LAB — CUP PACEART INCLINIC DEVICE CHECK
Date Time Interrogation Session: 20220222144500
Implantable Lead Implant Date: 20201009
Implantable Lead Implant Date: 20201009
Implantable Lead Location: 753859
Implantable Lead Location: 753860
Implantable Lead Model: 377
Implantable Lead Model: 377
Implantable Lead Serial Number: 81023057
Implantable Lead Serial Number: 81187700
Implantable Pulse Generator Implant Date: 20201009
Pulse Gen Model: 407145
Pulse Gen Serial Number: 69608254

## 2021-02-23 DIAGNOSIS — L820 Inflamed seborrheic keratosis: Secondary | ICD-10-CM | POA: Diagnosis not present

## 2021-03-09 ENCOUNTER — Telehealth: Payer: Self-pay

## 2021-03-09 NOTE — Telephone Encounter (Signed)
Biotronik alert received for his monitor not connected for 21 days. Spoke to patients daughter Denies (DPR), states she will have the patient check to see if plugged in or beside his bed. Advised to call if they need assistance or have questions.

## 2021-03-21 DIAGNOSIS — B351 Tinea unguium: Secondary | ICD-10-CM | POA: Diagnosis not present

## 2021-03-21 DIAGNOSIS — I739 Peripheral vascular disease, unspecified: Secondary | ICD-10-CM | POA: Diagnosis not present

## 2021-03-21 DIAGNOSIS — M79675 Pain in left toe(s): Secondary | ICD-10-CM | POA: Diagnosis not present

## 2021-03-22 NOTE — Telephone Encounter (Signed)
Monitor transmitted 03/22/2021

## 2021-03-28 ENCOUNTER — Ambulatory Visit (INDEPENDENT_AMBULATORY_CARE_PROVIDER_SITE_OTHER): Payer: Medicare Other

## 2021-03-28 DIAGNOSIS — I442 Atrioventricular block, complete: Secondary | ICD-10-CM | POA: Diagnosis not present

## 2021-03-30 LAB — CUP PACEART REMOTE DEVICE CHECK
Date Time Interrogation Session: 20220418152008
Implantable Lead Implant Date: 20201009
Implantable Lead Implant Date: 20201009
Implantable Lead Location: 753859
Implantable Lead Location: 753860
Implantable Lead Model: 377
Implantable Lead Model: 377
Implantable Lead Serial Number: 81023057
Implantable Lead Serial Number: 81187700
Implantable Pulse Generator Implant Date: 20201009
Pulse Gen Model: 407145
Pulse Gen Serial Number: 69608254

## 2021-04-13 NOTE — Progress Notes (Signed)
Remote pacemaker transmission.   

## 2021-06-06 DIAGNOSIS — I739 Peripheral vascular disease, unspecified: Secondary | ICD-10-CM | POA: Diagnosis not present

## 2021-06-06 DIAGNOSIS — B351 Tinea unguium: Secondary | ICD-10-CM | POA: Diagnosis not present

## 2021-06-06 DIAGNOSIS — M79675 Pain in left toe(s): Secondary | ICD-10-CM | POA: Diagnosis not present

## 2021-06-27 ENCOUNTER — Ambulatory Visit (INDEPENDENT_AMBULATORY_CARE_PROVIDER_SITE_OTHER): Payer: Medicare Other

## 2021-06-27 DIAGNOSIS — I442 Atrioventricular block, complete: Secondary | ICD-10-CM | POA: Diagnosis not present

## 2021-06-28 DIAGNOSIS — H401131 Primary open-angle glaucoma, bilateral, mild stage: Secondary | ICD-10-CM | POA: Diagnosis not present

## 2021-06-28 LAB — CUP PACEART REMOTE DEVICE CHECK
Date Time Interrogation Session: 20220718113905
Implantable Lead Implant Date: 20201009
Implantable Lead Implant Date: 20201009
Implantable Lead Location: 753859
Implantable Lead Location: 753860
Implantable Lead Model: 377
Implantable Lead Model: 377
Implantable Lead Serial Number: 81023057
Implantable Lead Serial Number: 81187700
Implantable Pulse Generator Implant Date: 20201009
Pulse Gen Model: 407145
Pulse Gen Serial Number: 69608254

## 2021-07-20 NOTE — Progress Notes (Signed)
Remote pacemaker transmission.   

## 2021-07-27 DIAGNOSIS — I442 Atrioventricular block, complete: Secondary | ICD-10-CM | POA: Diagnosis not present

## 2021-07-27 DIAGNOSIS — R519 Headache, unspecified: Secondary | ICD-10-CM | POA: Diagnosis not present

## 2021-07-27 DIAGNOSIS — Z1331 Encounter for screening for depression: Secondary | ICD-10-CM | POA: Diagnosis not present

## 2021-07-27 DIAGNOSIS — R7309 Other abnormal glucose: Secondary | ICD-10-CM | POA: Diagnosis not present

## 2021-07-27 DIAGNOSIS — R42 Dizziness and giddiness: Secondary | ICD-10-CM | POA: Diagnosis not present

## 2021-07-27 DIAGNOSIS — Z1389 Encounter for screening for other disorder: Secondary | ICD-10-CM | POA: Diagnosis not present

## 2021-07-27 DIAGNOSIS — E039 Hypothyroidism, unspecified: Secondary | ICD-10-CM | POA: Diagnosis not present

## 2021-07-27 DIAGNOSIS — Z6827 Body mass index (BMI) 27.0-27.9, adult: Secondary | ICD-10-CM | POA: Diagnosis not present

## 2021-07-27 DIAGNOSIS — M159 Polyosteoarthritis, unspecified: Secondary | ICD-10-CM | POA: Diagnosis not present

## 2021-07-27 DIAGNOSIS — E663 Overweight: Secondary | ICD-10-CM | POA: Diagnosis not present

## 2021-08-22 DIAGNOSIS — M79675 Pain in left toe(s): Secondary | ICD-10-CM | POA: Diagnosis not present

## 2021-08-22 DIAGNOSIS — I739 Peripheral vascular disease, unspecified: Secondary | ICD-10-CM | POA: Diagnosis not present

## 2021-08-22 DIAGNOSIS — B351 Tinea unguium: Secondary | ICD-10-CM | POA: Diagnosis not present

## 2021-09-06 ENCOUNTER — Emergency Department (HOSPITAL_COMMUNITY)
Admission: EM | Admit: 2021-09-06 | Discharge: 2021-09-06 | Disposition: A | Discharge status: Home / Self Care | Payer: Medicare Other | Attending: Emergency Medicine | Admitting: Emergency Medicine

## 2021-09-06 ENCOUNTER — Other Ambulatory Visit: Payer: Self-pay

## 2021-09-06 ENCOUNTER — Emergency Department (HOSPITAL_COMMUNITY): Payer: Medicare Other

## 2021-09-06 ENCOUNTER — Encounter (HOSPITAL_COMMUNITY): Payer: Self-pay | Admitting: *Deleted

## 2021-09-06 DIAGNOSIS — N3000 Acute cystitis without hematuria: Secondary | ICD-10-CM | POA: Diagnosis not present

## 2021-09-06 DIAGNOSIS — R600 Localized edema: Secondary | ICD-10-CM | POA: Diagnosis not present

## 2021-09-06 DIAGNOSIS — Z79899 Other long term (current) drug therapy: Secondary | ICD-10-CM | POA: Diagnosis not present

## 2021-09-06 DIAGNOSIS — R519 Headache, unspecified: Secondary | ICD-10-CM | POA: Diagnosis not present

## 2021-09-06 DIAGNOSIS — M7989 Other specified soft tissue disorders: Secondary | ICD-10-CM | POA: Insufficient documentation

## 2021-09-06 DIAGNOSIS — J9811 Atelectasis: Secondary | ICD-10-CM | POA: Diagnosis not present

## 2021-09-06 DIAGNOSIS — R0602 Shortness of breath: Secondary | ICD-10-CM | POA: Diagnosis not present

## 2021-09-06 DIAGNOSIS — R9431 Abnormal electrocardiogram [ECG] [EKG]: Secondary | ICD-10-CM | POA: Diagnosis not present

## 2021-09-06 DIAGNOSIS — Z20822 Contact with and (suspected) exposure to covid-19: Secondary | ICD-10-CM | POA: Diagnosis not present

## 2021-09-06 DIAGNOSIS — R531 Weakness: Secondary | ICD-10-CM | POA: Diagnosis not present

## 2021-09-06 DIAGNOSIS — Z87891 Personal history of nicotine dependence: Secondary | ICD-10-CM | POA: Diagnosis not present

## 2021-09-06 DIAGNOSIS — Z950 Presence of cardiac pacemaker: Secondary | ICD-10-CM | POA: Insufficient documentation

## 2021-09-06 LAB — HEPATIC FUNCTION PANEL
ALT: 18 U/L (ref 0–44)
AST: 22 U/L (ref 15–41)
Albumin: 3.6 g/dL (ref 3.5–5.0)
Alkaline Phosphatase: 76 U/L (ref 38–126)
Bilirubin, Direct: 0.1 mg/dL (ref 0.0–0.2)
Indirect Bilirubin: 0.7 mg/dL (ref 0.3–0.9)
Total Bilirubin: 0.8 mg/dL (ref 0.3–1.2)
Total Protein: 6.7 g/dL (ref 6.5–8.1)

## 2021-09-06 LAB — BASIC METABOLIC PANEL
Anion gap: 6 (ref 5–15)
BUN: 20 mg/dL (ref 8–23)
CO2: 29 mmol/L (ref 22–32)
Calcium: 9.1 mg/dL (ref 8.9–10.3)
Chloride: 101 mmol/L (ref 98–111)
Creatinine, Ser: 1.31 mg/dL — ABNORMAL HIGH (ref 0.61–1.24)
GFR, Estimated: 50 mL/min — ABNORMAL LOW (ref >60)
Glucose, Bld: 119 mg/dL — ABNORMAL HIGH (ref 70–99)
Potassium: 4.2 mmol/L (ref 3.5–5.1)
Sodium: 136 mmol/L (ref 135–145)

## 2021-09-06 LAB — URINALYSIS, ROUTINE W REFLEX MICROSCOPIC
Bilirubin Urine: NEGATIVE
Glucose, UA: NEGATIVE mg/dL
Ketones, ur: NEGATIVE mg/dL
Nitrite: NEGATIVE
Protein, ur: 30 mg/dL — AB
Specific Gravity, Urine: 1.013 (ref 1.005–1.030)
WBC, UA: >50 WBC/hpf — ABNORMAL HIGH (ref 0–5)
pH: 5 (ref 5.0–8.0)

## 2021-09-06 LAB — CBC
HCT: 48.3 % (ref 39.0–52.0)
Hemoglobin: 16.1 g/dL (ref 13.0–17.0)
MCH: 35.2 pg — ABNORMAL HIGH (ref 26.0–34.0)
MCHC: 33.3 g/dL (ref 30.0–36.0)
MCV: 105.7 fL — ABNORMAL HIGH (ref 80.0–100.0)
Platelets: 175 10*3/uL (ref 150–400)
RBC: 4.57 MIL/uL (ref 4.22–5.81)
RDW: 12.4 % (ref 11.5–15.5)
WBC: 7.3 10*3/uL (ref 4.0–10.5)
nRBC: 0 % (ref 0.0–0.2)

## 2021-09-06 LAB — TROPONIN I (HIGH SENSITIVITY)
Troponin I (High Sensitivity): 6 ng/L (ref <18)
Troponin I (High Sensitivity): 7 ng/L (ref <18)

## 2021-09-06 LAB — LIPASE, BLOOD: Lipase: 30 U/L (ref 11–51)

## 2021-09-06 LAB — BRAIN NATRIURETIC PEPTIDE: B Natriuretic Peptide: 63 pg/mL (ref 0.0–100.0)

## 2021-09-06 LAB — RESP PANEL BY RT-PCR (FLU A&B, COVID) ARPGX2
Influenza A by PCR: NEGATIVE
Influenza B by PCR: NEGATIVE
SARS Coronavirus 2 by RT PCR: NEGATIVE

## 2021-09-06 MED ORDER — SODIUM CHLORIDE 0.9 % IV SOLN
1.0000 g | Freq: Once | INTRAVENOUS | Status: AC
  Administered 2021-09-06: 1 g via INTRAVENOUS
  Filled 2021-09-06: qty 10

## 2021-09-06 MED ORDER — SODIUM CHLORIDE 0.9 % IV BOLUS (SEPSIS)
500.0000 mL | Freq: Once | INTRAVENOUS | Status: AC
  Administered 2021-09-06: 500 mL via INTRAVENOUS

## 2021-09-06 MED ORDER — SODIUM CHLORIDE 0.9 % IV SOLN: 1000.0000 mL | INTRAVENOUS | Status: DC

## 2021-09-06 MED ORDER — CEFUROXIME AXETIL 250 MG PO TABS: 250.0000 mg | ORAL_TABLET | Freq: Two times a day (BID) | ORAL | 0 refills | Status: AC

## 2021-09-06 NOTE — ED Notes (Signed)
Helped pt back to room per RN from restroom. Put pt back on monitor.

## 2021-09-06 NOTE — ED Triage Notes (Signed)
Weakness in both legs, unable to walk today

## 2021-09-06 NOTE — ED Provider Notes (Signed)
[facility] EMERGENCY DEPARTMENT Provider Note   CSN: [id number] Arrival date & time: 09/06/21  1246     History Chief Complaint  Patient presents with   Extremity Weakness    Chase Chase is a 87 y.o. male.   Extremity Weakness   Patient presents to the ED for evaluation of weakness.  History was provided by the patient and his family.  Over the last several days patient's had increasing weakness.  He is finding it difficult to walk because he feels like his legs are going to give out on him.  Patient has not had any vomiting or diarrhea.  No recent fevers or chills.  He has had some burning discomfort in his upper abdomen and his appetite has been somewhat decreased.  He also has noticed some swelling in his legs left more than right.  He has had a mild headache but not having any trouble with his speech.  No weakness in his arms.  No recent falls.  Past Medical History:  Diagnosis Date   At risk of UTI    CHB (complete heart block) ([place]) 09/2019   CHB (complete heart block) ([place]) 09/2019   Glaucoma    Glaucoma    HOH (hard of hearing)    Hypercholesterolemia    Pacemaker 09/19/2019   Biotronik dual chamber PPM   Prostate disease    unspecified   Vertigo     Patient Active Problem List   Diagnosis Date Noted   Pacemaker 02/01/2021   CHB (complete heart block) ([place]) 09/18/2019   Neck stiffness 04/24/2013   Postural imbalance 04/24/2013   UTI (lower urinary tract infection) 04/08/2013   Near syncope 04/08/2013   Dehydration 04/08/2013   History of vertigo 04/08/2013   Abnormal EKG 04/08/2013   Abnormal CXR (chest x-ray) 01/10/2012   Bronchitis with flu 12/10/2011   Glaucoma 12/10/2011    Past Surgical History:  Procedure Laterality Date   CHOLECYSTECTOMY     HERNIA REPAIR     bilateral   PACEMAKER IMPLANT N/A 09/19/2019   Procedure: PACEMAKER IMPLANT;  Surgeon: Evans Lance, MD;  Location: [facility];  Service: Cardiovascular;  Laterality: N/A;        Family History  Problem Relation Age of Onset   Breast cancer Sister    Lung cancer Brother    Lung cancer Brother    Lung cancer Brother    Breast cancer Sister    Breast cancer Sister     Social History   Tobacco Use   Smoking status: Former    Packs/day: 2.00    Years: 8.00    Pack years: 16.00    Types: Cigarettes   Smokeless tobacco: Former    Types: Chew    Quit date: 10/12/2011   Tobacco comments:    Chewing and snuff  Vaping Use   Vaping Use: Never used  Substance Use Topics   Alcohol use: No   Drug use: No    Home Medications Prior to Admission medications   Medication Sig Start Date End Date Taking? Authorizing Provider  cefUROXime (CEFTIN) 250 MG tablet Take 1 tablet (250 mg total) by mouth 2 (two) times daily with a meal for 7 days. 09/06/21 09/13/21 Yes Dorie Rank, MD  latanoprost (XALATAN) 0.005 % ophthalmic solution Place 1 drop into both eyes at bedtime as needed.  08/01/19   [provider]  levothyroxine (SYNTHROID) 50 MCG tablet Take 50 mcg by mouth daily. 12/19/19   [provider]  meclizine (ANTIVERT) 25 MG tablet Take 25 mg by mouth as needed for dizziness (rarely needs).     [provider]  Misc Natural Products (PROSTATE HEALTH PO) Take 2 tablets by mouth daily.    [provider]  naproxen sodium (ALEVE) 220 MG tablet Take 220 mg by mouth daily as needed.    [provider]  Omega-3 Fatty Acids (FISH OIL) 645 MG CAPS Take 1,290 mg by mouth daily. Take 2 capsules daily for cholesterol.    [provider]  Vitamin D, Ergocalciferol, (DRISDOL) 1.25 MG (50000 UT) CAPS capsule Take 1 capsule by mouth every 30 (thirty) days. 08/23/19   [provider]  zinc gluconate 50 MG tablet Take 50 mg by mouth daily.    [provider]    Allergies    Patient has no known allergies.  Review of Systems   Review of Systems  Musculoskeletal:  Positive for extremity weakness.  All other  systems reviewed and are negative.  Physical Exam Updated Vital Signs BP 110/75   Pulse 67   Temp 97.7 F (36.5 C) (Oral)   Resp 13   SpO2 96%   Physical Exam Vitals and nursing note reviewed.  Constitutional:      Appearance: He is well-developed. He is not toxic-appearing or diaphoretic.     Comments: Elderly elderly, frail  HENT:     Head: Normocephalic and atraumatic.     Right Ear: External ear normal.     Left Ear: External ear normal.  Eyes:     General: No scleral icterus.       Right eye: No discharge.        Left eye: No discharge.     Conjunctiva/sclera: Conjunctivae normal.  Neck:     Trachea: No tracheal deviation.  Cardiovascular:     Rate and Rhythm: Normal rate and regular rhythm.  Pulmonary:     Effort: Pulmonary effort is normal. No respiratory distress.     Breath sounds: Normal breath sounds. No stridor. No wheezing or rales.  Abdominal:     General: Bowel sounds are normal. There is no distension.     Palpations: Abdomen is soft.     Tenderness: There is no abdominal tenderness. There is no guarding or rebound.  Musculoskeletal:        General: Swelling present. No tenderness or deformity.     Cervical back: Neck supple.     Left lower leg: Edema present.  Skin:    General: Skin is warm and dry.     Findings: No rash.  Neurological:     General: No focal deficit present.     Cranial Nerves: No cranial nerve deficit (no facial droop, extraocular movements intact, no slurred speech).     Sensory: No sensory deficit.     Motor: Weakness present. No abnormal muscle tone or seizure activity.     Coordination: Coordination normal.     Comments: Able to lift both arms off the bed, able to lift both legs off the bed  Psychiatric:        Mood and Affect: Mood normal.    ED Results / Procedures / Treatments   Labs (all labs ordered are listed, but only abnormal results are displayed) Labs Reviewed  BASIC METABOLIC PANEL - Abnormal; Notable for the  following components:      Result Value   Glucose, Bld 119 (*)    Creatinine, Ser 1.31 (*)    GFR, Estimated  50 (*)    All other components within normal limits  CBC - Abnormal; Notable for the following components:   MCV 105.7 (*)    MCH 35.2 (*)    All other components within normal limits  URINALYSIS, ROUTINE W REFLEX MICROSCOPIC - Abnormal; Notable for the following components:   APPearance CLOUDY (*)    Hgb urine dipstick MODERATE (*)    Protein, ur 30 (*)    Leukocytes,Ua LARGE (*)    WBC, UA >50 (*)    Bacteria, UA FEW (*)    All other components within normal limits  RESP PANEL BY RT-PCR (FLU A&B, COVID) ARPGX2  URINE CULTURE  LIPASE, BLOOD  HEPATIC FUNCTION PANEL  BRAIN NATRIURETIC PEPTIDE  TROPONIN I (HIGH SENSITIVITY)  TROPONIN I (HIGH SENSITIVITY)    EKG EKG Interpretation  Date/Time:  Tuesday September 06 2021 13:43:32 EDT Ventricular Rate:  60 PR Interval:  272 QRS Duration: 148 QT Interval:  440 QTC Calculation: 440 R Axis:   -44 Text Interpretation: sinus with 1st degree av block Left axis deviation Right bundle branch block Minimal voltage criteria for LVH, may be normal variant ( R in aVL ) Abnormal ECG Confirmed by Dorie Rank [phone]) on 09/06/2021 3:06:13 PM  Radiology US Venous Img Lower Bilateral  Result Date: 09/06/2021 CLINICAL DATA:  Bilateral lower extremity weakness and swelling. EXAM: BILATERAL LOWER EXTREMITY VENOUS DOPPLER ULTRASOUND TECHNIQUE: Gray-scale sonography with graded compression, as well as color Doppler and duplex ultrasound were performed to evaluate the lower extremity deep venous systems from the level of the common femoral vein and including the common femoral, femoral, profunda femoral, popliteal and calf veins including the posterior tibial, peroneal and gastrocnemius veins when visible. The superficial great saphenous vein was also interrogated. Spectral Doppler was utilized to evaluate flow at rest and with distal augmentation  maneuvers in the common femoral, femoral and popliteal veins. COMPARISON:  None. FINDINGS: RIGHT LOWER EXTREMITY Common Femoral Vein: No evidence of thrombus. Normal compressibility, respiratory phasicity and response to augmentation. Saphenofemoral Junction: No evidence of thrombus. Normal compressibility and flow on color Doppler imaging. Profunda Femoral Vein: No evidence of thrombus. Normal compressibility and flow on color Doppler imaging. Femoral Vein: No evidence of thrombus. Normal compressibility, respiratory phasicity and response to augmentation. Popliteal Vein: No evidence of thrombus. Normal compressibility, respiratory phasicity and response to augmentation. Calf Veins: Visualized right deep calf veins are patent without thrombus. Superficial Great Saphenous Vein: No evidence of thrombus. Normal compressibility. LEFT LOWER EXTREMITY Common Femoral Vein: No evidence of thrombus. Normal compressibility, respiratory phasicity and response to augmentation. Saphenofemoral Junction: No evidence of thrombus. Normal compressibility and flow on color Doppler imaging. Profunda Femoral Vein: No evidence of thrombus. Normal compressibility and flow on color Doppler imaging. Femoral Vein: No evidence of thrombus. Normal compressibility, respiratory phasicity and response to augmentation. Popliteal Vein: No evidence of thrombus. Normal compressibility, respiratory phasicity and response to augmentation. Calf Veins: Visualized left deep calf veins are patent without thrombus. Superficial Great Saphenous Vein: No evidence of thrombus. Normal compressibility. Other Findings:  Bilateral ankle edema. IMPRESSION: No evidence of deep venous thrombosis in either lower extremity. Electronically Signed   By: Markus Daft M.D.   On: 09/06/2021 17:39   DG Chest Portable 1 View  Result Date: 09/06/2021 CLINICAL DATA:  Weakness, leg swelling.  Shortness of breath EXAM: PORTABLE CHEST 1 VIEW COMPARISON:  Chest x-ray 09/20/2019  FINDINGS: Left chest wall cardiac pacemaker with 2 leads. The heart and mediastinal contours are unchanged.  Left base atelectasis. No focal consolidation. No pulmonary edema. No pleural effusion. No pneumothorax. No acute osseous abnormality. IMPRESSION: No active disease. Electronically Signed   By: Iven Finn M.D.   On: 09/06/2021 16:06    Procedures Procedures   Medications Ordered in ED Medications  sodium chloride 0.9 % bolus 500 mL (0 mLs Intravenous Stopped 09/06/21 1750)    Followed by  0.9 %  sodium chloride infusion (has no administration in time range)  cefTRIAXone (ROCEPHIN) 1 g in sodium chloride 0.9 % 100 mL IVPB (0 g Intravenous Stopped 09/06/21 1858)    ED Course  I have reviewed the triage vital signs and the nursing notes.  Pertinent labs & imaging results that were available during my care of the patient were reviewed by me and considered in my medical decision making (see chart for details).  Clinical Course as of 09/06/21 1920  Tue Sep 06, 2021  1630 CBC unremarkable.  Metabolic panel with elevated creatinine, similar to previous values. [JK]  1027 Chest x-ray without acute findings [JK]  1740 Urinalysis suggests urinary tract infection. [JK]  1916 Patient states he is feeling better.  He was able to eat and drink.  He was able to walk to the bathroom .  Patient would prefer to go home and does not want to be admitted [JK]    Clinical Course User Index [JK] Dorie Rank, MD   MDM Rules/Calculators/A&P                           Patient presented to the ED with complaints of general weakness.  His exam is overall reassuring.  He had no focal deficits.  Patient's vital signs do not show signs of hypotension or fever.  His laboratory test were reassuring.  He does not have any signs of severe dehydration.  No anemia.  COVID and flu are negative.  Cardiac markers LFTs and lipase are normal.  His urinalysis does show urinary tract infection.  Patient has had some  urinary frequency.  He was given a dose of antibiotics.  Patient was given IV fluids.  He has been able to eat and drink and walk around the emergency room.  Had a discussion with the patient and his daughter.  He would still like to go home.  They are comfortable with this plan.  Warning signs precautions discussed Final Clinical Impression(s) / ED Diagnoses Final diagnoses:  Acute cystitis without hematuria    Rx / DC Orders ED Discharge Orders          Ordered    cefUROXime (CEFTIN) 250 MG tablet  2 times daily with meals        09/06/21 1917             Dorie Rank, MD 09/06/21 1920

## 2021-09-06 NOTE — Discharge Instructions (Signed)
Take the antibiotics as prescribed.  Follow-up with your primary care doctor later this week to be rechecked.  Return to the ED if you have recurrent weakness or start developing fevers, vomiting

## 2021-09-06 NOTE — ED Notes (Signed)
Pt ambulated with walker to BR

## 2021-09-06 NOTE — ED Notes (Signed)
Pt ambulated back to room with walker without difficultly.  Pt with redness and abrasion noted to bottom.

## 2021-09-09 LAB — URINE CULTURE: Culture: >=100000 — AB

## 2021-09-10 ENCOUNTER — Telehealth: Payer: Self-pay | Admitting: Emergency Medicine

## 2021-09-10 NOTE — Telephone Encounter (Signed)
Post ED Visit - Positive Culture Follow-up: Successful Patient Follow-Up  Culture assessed and recommendations reviewed by:  []  Elenor Quinones, Pharm.D. []  Heide Guile, Pharm.D., BCPS AQ-ID []  Parks Neptune, Pharm.D., BCPS []  Alycia Rossetti, Pharm.D., BCPS []  [place], Pharm.D., BCPS, AAHIVP []  [name], Pharm.D., BCPS, AAHIVP []  Salome Arnt, PharmD, BCPS []  Johnnette Gourd, PharmD, BCPS []  Hughes Better, PharmD, BCPS []  Bertis Ruddy, PharmD  Positive urine culture  []  Patient discharged without antimicrobial prescription and treatment is now indicated [x]  Organism is resistant to prescribed ED discharge antimicrobial []  Patient with positive blood cultures  Changes discussed with ED provider: Alecia Lemming, PA New antibiotic prescription Amoxl 875 mg BID for five days Called to [facility] [phone]  Contacted patient, date 09/10/2021, time Pierre Part 09/10/2021, 6:02 PM

## 2021-09-10 NOTE — Progress Notes (Signed)
ED Antimicrobial Stewardship Positive Culture Follow Up   Chase Chase is an 87 y.o. male who presented to [facility] on 09/06/2021 with a chief complaint of  Chief Complaint  Patient presents with   Extremity Weakness    Recent Results (from the past 720 hour(s))  Resp Panel by RT-PCR (Flu A&B, Covid) Nasopharyngeal Swab     Status: None   Collection Time: 09/06/21  3:30 PM   Specimen: Nasopharyngeal Swab; Nasopharyngeal(NP) swabs in vial transport medium  Result Value Ref Range Status   SARS Coronavirus 2 by RT PCR NEGATIVE NEGATIVE Final    Comment: (NOTE) SARS-CoV-2 target nucleic acids are NOT DETECTED.  The SARS-CoV-2 RNA is generally detectable in upper respiratory specimens during the acute phase of infection. The lowest concentration of SARS-CoV-2 viral copies this assay can detect is 138 copies/mL. A negative result does not preclude SARS-Cov-2 infection and should not be used as the sole basis for treatment or other patient management decisions. A negative result may occur with  improper specimen collection/handling, submission of specimen other than nasopharyngeal swab, presence of viral mutation(s) within the areas targeted by this assay, and inadequate number of viral copies(<138 copies/mL). A negative result must be combined with clinical observations, patient history, and epidemiological information. The expected result is Negative.  Fact Sheet for Patients:  EntrepreneurPulse.com.au  Fact Sheet for Healthcare Providers:  IncredibleEmployment.be  This test is no t yet approved or cleared by the [place] FDA and  has been authorized for detection and/or diagnosis of SARS-CoV-2 by FDA under an Emergency Use Authorization (EUA). This EUA will remain  in effect (meaning this test can be used) for the duration of the COVID-19 declaration under Section 564(b)(1) of the Act, 21 U.S.C.section 360bbb-3(b)(1), unless the  authorization is terminated  or revoked sooner.       Influenza A by PCR NEGATIVE NEGATIVE Final   Influenza B by PCR NEGATIVE NEGATIVE Final    Comment: (NOTE) The Xpert Xpress SARS-CoV-2/FLU/RSV plus assay is intended as an aid in the diagnosis of influenza from Nasopharyngeal swab specimens and should not be used as a sole basis for treatment. Nasal washings and aspirates are unacceptable for Xpert Xpress SARS-CoV-2/FLU/RSV testing.  Fact Sheet for Patients: EntrepreneurPulse.com.au  Fact Sheet for Healthcare Providers: IncredibleEmployment.be  This test is not yet approved or cleared by the [place] FDA and has been authorized for detection and/or diagnosis of SARS-CoV-2 by FDA under an Emergency Use Authorization (EUA). This EUA will remain in effect (meaning this test can be used) for the duration of the COVID-19 declaration under Section 564(b)(1) of the Act, 21 U.S.C. section 360bbb-3(b)(1), unless the authorization is terminated or revoked.  Performed at [facility], [address], [place], [place] [address]   Urine Culture     Status: Abnormal   Collection Time: 09/06/21  5:42 PM   Specimen: Urine, Clean Catch  Result Value Ref Range Status   Specimen Description   Final    URINE, CLEAN CATCH Performed at [facility], [address] [place], [place] [address]    Special Requests   Final    NONE Performed at [facility], [address] [place], [place] [address]    Culture >=100,000 COLONIES/mL ENTEROCOCCUS FAECALIS (A)  Final   Report Status 09/09/2021 FINAL  Final   Organism ID, Bacteria ENTEROCOCCUS FAECALIS (A)  Final      Susceptibility   Enterococcus faecalis - MIC*    AMPICILLIN <=2 SENSITIVE Sensitive  NITROFURANTOIN <=16 SENSITIVE Sensitive     VANCOMYCIN 1 SENSITIVE Sensitive     * >=100,000 COLONIES/mL ENTEROCOCCUS FAECALIS    [x]  Treated with Cefuroxime, organism resistant to prescribed  antimicrobial  New antibiotic prescription: Amoxil 875mg  BID x 5d  ED Provider: Alecia Lemming, PA-C   Bertis Ruddy 09/10/2021, 10:11 AM Clinical Pharmacist Monday - Friday phone -  [phone] Saturday - Sunday phone - [phone]

## 2021-09-13 ENCOUNTER — Emergency Department (HOSPITAL_COMMUNITY): Payer: Medicare Other

## 2021-09-13 ENCOUNTER — Encounter (HOSPITAL_COMMUNITY): Payer: Self-pay

## 2021-09-13 ENCOUNTER — Emergency Department (HOSPITAL_COMMUNITY)
Admission: EM | Admit: 2021-09-13 | Discharge: 2021-09-13 | Disposition: A | Discharge status: Home / Self Care | Payer: Medicare Other | Attending: Emergency Medicine | Admitting: Emergency Medicine

## 2021-09-13 ENCOUNTER — Other Ambulatory Visit: Payer: Self-pay

## 2021-09-13 DIAGNOSIS — R079 Chest pain, unspecified: Secondary | ICD-10-CM | POA: Diagnosis not present

## 2021-09-13 DIAGNOSIS — R4182 Altered mental status, unspecified: Secondary | ICD-10-CM | POA: Diagnosis not present

## 2021-09-13 DIAGNOSIS — N390 Urinary tract infection, site not specified: Secondary | ICD-10-CM | POA: Diagnosis not present

## 2021-09-13 DIAGNOSIS — R519 Headache, unspecified: Secondary | ICD-10-CM | POA: Diagnosis not present

## 2021-09-13 DIAGNOSIS — Z87891 Personal history of nicotine dependence: Secondary | ICD-10-CM | POA: Insufficient documentation

## 2021-09-13 DIAGNOSIS — Z950 Presence of cardiac pacemaker: Secondary | ICD-10-CM | POA: Insufficient documentation

## 2021-09-13 DIAGNOSIS — I672 Cerebral atherosclerosis: Secondary | ICD-10-CM | POA: Diagnosis not present

## 2021-09-13 DIAGNOSIS — R410 Disorientation, unspecified: Secondary | ICD-10-CM | POA: Diagnosis not present

## 2021-09-13 LAB — CBC
HCT: 47.5 % (ref 39.0–52.0)
Hemoglobin: 16 g/dL (ref 13.0–17.0)
MCH: 35 pg — ABNORMAL HIGH (ref 26.0–34.0)
MCHC: 33.7 g/dL (ref 30.0–36.0)
MCV: 103.9 fL — ABNORMAL HIGH (ref 80.0–100.0)
Platelets: 190 10*3/uL (ref 150–400)
RBC: 4.57 MIL/uL (ref 4.22–5.81)
RDW: 12 % (ref 11.5–15.5)
WBC: 6.5 10*3/uL (ref 4.0–10.5)
nRBC: 0 % (ref 0.0–0.2)

## 2021-09-13 LAB — COMPREHENSIVE METABOLIC PANEL
ALT: 34 U/L (ref 0–44)
AST: 26 U/L (ref 15–41)
Albumin: 3.7 g/dL (ref 3.5–5.0)
Alkaline Phosphatase: 71 U/L (ref 38–126)
Anion gap: 7 (ref 5–15)
BUN: 22 mg/dL (ref 8–23)
CO2: 29 mmol/L (ref 22–32)
Calcium: 9 mg/dL (ref 8.9–10.3)
Chloride: 102 mmol/L (ref 98–111)
Creatinine, Ser: 1.23 mg/dL (ref 0.61–1.24)
GFR, Estimated: 53 mL/min — ABNORMAL LOW (ref >60)
Glucose, Bld: 99 mg/dL (ref 70–99)
Potassium: 4.5 mmol/L (ref 3.5–5.1)
Sodium: 138 mmol/L (ref 135–145)
Total Bilirubin: 0.9 mg/dL (ref 0.3–1.2)
Total Protein: 6.6 g/dL (ref 6.5–8.1)

## 2021-09-13 LAB — URINALYSIS, ROUTINE W REFLEX MICROSCOPIC
Bacteria, UA: NONE SEEN
Bilirubin Urine: NEGATIVE
Glucose, UA: 50 mg/dL — AB
Ketones, ur: NEGATIVE mg/dL
Nitrite: NEGATIVE
Protein, ur: 30 mg/dL — AB
Specific Gravity, Urine: 1.014 (ref 1.005–1.030)
WBC, UA: >50 WBC/hpf — ABNORMAL HIGH (ref 0–5)
pH: 5 (ref 5.0–8.0)

## 2021-09-13 LAB — CBG MONITORING, ED: Glucose-Capillary: 104 mg/dL — ABNORMAL HIGH (ref 70–99)

## 2021-09-13 NOTE — ED Triage Notes (Signed)
Pt. Arrived via POV with complaints of confusion. Per family member pt. Is having hallucinations as well.

## 2021-09-13 NOTE — ED Notes (Signed)
Patient ambulated to restroom with RN using a walker.

## 2021-09-13 NOTE — Discharge Instructions (Addendum)
The testing today did not show a stroke or other problems in the brain.  Most likely he has delirium from the UTI.  Continue taking the antibiotic, until gone to treat the urinary tract infection.  Make sure he is eating and drinking regularly.  Support him by close observation and assistance, to help him get through this problem.  You can always return here if needed.

## 2021-09-13 NOTE — ED Provider Notes (Signed)
[facility] EMERGENCY DEPARTMENT Provider Note   CSN: [id number] Arrival date & time: 09/13/21  1204     History Chief Complaint  Patient presents with   Altered Mental Status    Chase Chase is a 87 y.o. male.  HPI He is here today because he has been confused.  His daughter helps to give history.  She had him come in because after complaining of headache today he has been confused all day today.  He is recovering from a UTI, currently taking antibiotics.  This was started from the ED, last week.  After the culture results returned he was switched to ampicillin from cefuroxime; this occurred 3 days ago.  At that time he was very weak and could not walk.  He is now walking better so his daughter feels like the urinary tract infection is improving.  He has been able to eat.  There is been no fever.  The patient has been delusional and seeing things that are not there.  He has had prior headaches but no confusion with this.  He has not been vomiting.  He is taking his usual medications.  He states that his headache has resolved, and not returned.  There are no other known active modifying factors.    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   Prostate disease    unspecified   Vertigo     Patient Active Problem List   Diagnosis Date Noted   Pacemaker 02/01/2021   CHB (complete heart block) ([place]) 09/18/2019   Neck stiffness 04/24/2013   Postural imbalance 04/24/2013   UTI (lower urinary tract infection) 04/08/2013   Near syncope 04/08/2013   Dehydration 04/08/2013   History of vertigo 04/08/2013   Abnormal EKG 04/08/2013   Abnormal CXR (chest x-ray) 01/10/2012   Bronchitis with flu 12/10/2011   Glaucoma 12/10/2011    Past Surgical History:   Procedure Laterality Date   CHOLECYSTECTOMY     HERNIA REPAIR     bilateral   PACEMAKER IMPLANT N/A 09/19/2019   Procedure: PACEMAKER IMPLANT;  Surgeon: Evans Lance, MD;  Location: [place] [place] [facility];  Service: Cardiovascular;  Laterality: N/A;       Family History  Problem Relation Age of Onset   Breast cancer Sister    Lung cancer Brother    Lung cancer Brother    Lung cancer Brother    Breast cancer Sister    Breast cancer Sister     Social History   Tobacco Use   Smoking status: Former    Packs/day: 2.00    Years: 8.00    Pack years: 16.00    Types: Cigarettes   Smokeless tobacco: Former    Types: Chew    Quit date: 10/12/2011   Tobacco comments:    Chewing and snuff  Vaping Use   Vaping Use: Never used  Substance Use Topics   Alcohol use: No   Drug use: No    Home Medications Prior to Admission medications   Medication Sig Start Date End Date Taking? Authorizing Provider  latanoprost (XALATAN) 0.005 % ophthalmic solution Place 1 drop into both eyes at bedtime as needed.  08/01/19   [provider]  levothyroxine (SYNTHROID) 50 MCG tablet Take 50 mcg by mouth daily. 12/19/19   [provider]  meclizine (ANTIVERT) 25 MG tablet Take 25 mg by mouth as needed for dizziness (rarely needs).     [provider]  Misc Natural Products (PROSTATE HEALTH PO) Take 2 tablets by mouth daily.    [provider]  naproxen sodium (ALEVE) 220 MG tablet Take 220 mg by mouth daily as needed.    [provider]  Omega-3 Fatty Acids (FISH OIL) 645 MG CAPS Take 1,290 mg by mouth daily. Take 2 capsules daily for cholesterol.    [provider]  Vitamin D, Ergocalciferol, (DRISDOL) 1.25 MG (50000 UT) CAPS capsule Take 1 capsule by mouth every 30 (thirty) days. 08/23/19   [provider]  zinc gluconate 50 MG tablet Take 50 mg by mouth daily.    [provider]    Allergies    Patient has no known  allergies.  Review of Systems   Review of Systems  All other systems reviewed and are negative.  Physical Exam Updated Vital Signs BP 139/81 (BP Location: Right Arm)   Pulse 73   Temp 98.2 F (36.8 C) (Oral)   Resp 18   Ht 5\' 8"  (1.727 m)   Wt 76.2 kg   SpO2 99%   BMI 25.54 kg/m   Physical Exam Vitals and nursing note reviewed.  Constitutional:      General: He is not in acute distress.    Appearance: He is well-developed. He is not ill-appearing, toxic-appearing or diaphoretic.  HENT:     Head: Normocephalic and atraumatic.     Right Ear: External ear normal.     Left Ear: External ear normal.  Eyes:     Conjunctiva/sclera: Conjunctivae normal.     Pupils: Pupils are equal, round, and reactive to light.  Neck:     Trachea: Phonation normal.  Cardiovascular:     Rate and Rhythm: Normal rate and regular rhythm.     Heart sounds: Normal heart sounds.  Pulmonary:     Effort: Pulmonary effort is normal.     Breath sounds: Normal breath sounds.  Abdominal:     Palpations: Abdomen is soft.     Tenderness: There is no abdominal tenderness.  Musculoskeletal:        General: Normal range of motion.     Cervical back: Normal range of motion and neck supple. No rigidity or tenderness.  Skin:    General: Skin is warm and dry.  Neurological:     Mental Status: He is alert and oriented to person, place, and time.     Cranial Nerves: No cranial nerve deficit.     Sensory: No sensory deficit.     Motor: No abnormal muscle tone.     Coordination: Coordination normal.     Comments: No dysarthria or aphasia.  Normal strength arms and legs bilaterally.  No pronator drift.  No ataxia  Psychiatric:        Mood and Affect: Mood normal.        Behavior: Behavior normal.     Comments: No active hallucinations or delusions.    ED Results / Procedures / Treatments   Labs (all labs ordered are listed, but only abnormal results are displayed) Labs Reviewed  COMPREHENSIVE METABOLIC  PANEL - Abnormal; Notable for the following components:      Result Value   GFR, Estimated 53 (*)    All other components within  normal limits  CBC - Abnormal; Notable for the following components:   MCV 103.9 (*)    MCH 35.0 (*)    All other components within normal limits  URINALYSIS, ROUTINE W REFLEX MICROSCOPIC - Abnormal; Notable for the following components:   Glucose, UA 50 (*)    Hgb urine dipstick SMALL (*)    Protein, ur 30 (*)    Leukocytes,Ua MODERATE (*)    WBC, UA >50 (*)    All other components within normal limits  CBG MONITORING, ED - Abnormal; Notable for the following components:   Glucose-Capillary 104 (*)    All other components within normal limits    EKG None  Radiology CT Head Wo Contrast  Result Date: 09/13/2021 CLINICAL DATA:  Delirium EXAM: CT HEAD WITHOUT CONTRAST TECHNIQUE: Contiguous axial images were obtained from the base of the skull through the vertex without intravenous contrast. COMPARISON:  04/12/2018 FINDINGS: Brain: No evidence of acute infarction, hemorrhage, hydrocephalus, extra-axial collection or mass lesion/mass effect. Periventricular white matter changes, likely the sequela of chronic small vessel ischemic disease. Overall unchanged mild cerebral volume loss. Redemonstrated calcifications along the falx with additional calcific/ossific density anterior to the left frontal lobe, unchanged. Vascular: No hyperdense vessel. Atherosclerotic calcifications in the intracranial carotid and vertebral arteries. Skull: Normal. Negative for fracture or focal lesion. Sinuses/Orbits: No acute finding. Status post bilateral lens replacements. Other: The mastoids are well aerated. IMPRESSION: No acute intracranial process. Electronically Signed   By: Merilyn Baba M.D.   On: 09/13/2021 18:00    Procedures Procedures   Medications Ordered in ED Medications - No data to display  ED Course  I have reviewed the triage vital signs and the nursing  notes.  Pertinent labs & imaging results that were available during my care of the patient were reviewed by me and considered in my medical decision making (see chart for details).    MDM Rules/Calculators/A&P                            Patient Vitals for the past 24 hrs:  BP Temp Temp src Pulse Resp SpO2 Height Weight  09/13/21 1824 139/81 98.2 F (36.8 C) Oral 73 18 99 % -- --  09/13/21 1548 131/68 -- -- 60 18 99 % -- --  09/13/21 1305 -- -- -- -- -- -- 5\' 8"  (1.727 m) 76.2 kg  09/13/21 1303 129/67 98.3 F (36.8 C) Oral 60 17 97 % -- --    6:20 PM Reevaluation with update and discussion. After initial assessment and treatment, an updated evaluation reveals no change in clinical status.  He continues to have hallucinations according to the daughter.  The patient is alert and conversant.  He indicates that he wants to go home.  I discussed possibilities of treatment including hospitalization for delirium versus go home and continue current medications as prescribed.  Daughter prefers that he go home.  She will have him follow-up with his primary care doctor in the next couple of days, and then get a repeat urine culture after the antibiotics are completed.Daleen Bo   Medical Decision Making:  This patient is presenting for evaluation of confusion., which does require a range of treatment options, and is a complaint that involves a high risk of morbidity and mortality. The differential diagnoses include delirium, CVA, metabolic disorder, sepsis. I decided to review old records, and in summary elderly male, currently being treated for UTI, with history  of heart block and status post pacemaker placement.  Antibiotic recently changed from cephalosporin to ampicillin for Enterococcus UTI..  I obtained additional historical information from daughter at bedside.  Clinical Laboratory Tests Ordered, included CBC, Metabolic panel, and Urinalysis. Review indicates normal except UA consistent  with infection, MCV high, mild hyperglycemia. Radiologic Tests Ordered, included CT head.  I independently Visualized: Radiographic images, which show no acute abnormality    Critical Interventions-evaluation, laboratory testing, CT imaging, observation and reassessment  After These Interventions, the Patient was reevaluated and was found with likely delirium from UTI versus early onset dementia symptoms.  At the time of discharge, patient was anxious to go home and daughter felt like she could help him manage himself there.  Alternative would be to admit for observation and stabilization.  Discussed pros and cons of admission versus discharge.  Patient is being treated for UTI, partial treatment at this time, and is currently stable for discharge with close outpatient management.  He has access to treatment by his PCP in the outpatient setting.  CRITICAL CARE-no Performed by: Daleen Bo  Nursing Notes Reviewed/ Care Coordinated Applicable Imaging Reviewed Interpretation of Laboratory Data incorporated into ED treatment  The patient appears reasonably screened and/or stabilized for discharge and I doubt any other medical condition or other [facility] requiring further screening, evaluation, or treatment in the ED at this time prior to discharge.  Plan: Home Medications-continue current; Home Treatments-rest, fluids, continue to reorient patient to state with family assistance; return here if the recommended treatment, does not improve the symptoms; Recommended follow up-PCP checkup 2 days and suggest repeat urine culture in about a week.     Final Clinical Impression(s) / ED Diagnoses Final diagnoses:  Altered mental status, unspecified altered mental status type  Urinary tract infection without hematuria, site unspecified    Rx / DC Orders ED Discharge Orders     None        Daleen Bo, MD 09/14/21 1100

## 2021-09-13 NOTE — ED Notes (Signed)
Patient transported to CT 

## 2021-09-16 DIAGNOSIS — Z6827 Body mass index (BMI) 27.0-27.9, adult: Secondary | ICD-10-CM | POA: Diagnosis not present

## 2021-09-16 DIAGNOSIS — E663 Overweight: Secondary | ICD-10-CM | POA: Diagnosis not present

## 2021-09-16 DIAGNOSIS — N390 Urinary tract infection, site not specified: Secondary | ICD-10-CM | POA: Diagnosis not present

## 2021-09-16 DIAGNOSIS — M159 Polyosteoarthritis, unspecified: Secondary | ICD-10-CM | POA: Diagnosis not present

## 2021-09-16 DIAGNOSIS — E039 Hypothyroidism, unspecified: Secondary | ICD-10-CM | POA: Diagnosis not present

## 2021-09-26 ENCOUNTER — Ambulatory Visit (INDEPENDENT_AMBULATORY_CARE_PROVIDER_SITE_OTHER): Payer: Medicare Other

## 2021-09-26 DIAGNOSIS — I442 Atrioventricular block, complete: Secondary | ICD-10-CM

## 2021-09-26 DIAGNOSIS — Z23 Encounter for immunization: Secondary | ICD-10-CM | POA: Diagnosis not present

## 2021-09-26 DIAGNOSIS — N390 Urinary tract infection, site not specified: Secondary | ICD-10-CM | POA: Diagnosis not present

## 2021-09-27 LAB — CUP PACEART REMOTE DEVICE CHECK
Date Time Interrogation Session: 20221017131810
Implantable Lead Implant Date: 20201009
Implantable Lead Implant Date: 20201009
Implantable Lead Location: 753859
Implantable Lead Location: 753860
Implantable Lead Model: 377
Implantable Lead Model: 377
Implantable Lead Serial Number: 81023057
Implantable Lead Serial Number: 81187700
Implantable Pulse Generator Implant Date: 20201009
Pulse Gen Model: 407145
Pulse Gen Serial Number: 69608254

## 2021-10-04 NOTE — Progress Notes (Signed)
Remote pacemaker transmission.   

## 2021-10-07 DIAGNOSIS — N390 Urinary tract infection, site not specified: Secondary | ICD-10-CM | POA: Diagnosis not present

## 2021-10-31 DIAGNOSIS — M79675 Pain in left toe(s): Secondary | ICD-10-CM | POA: Diagnosis not present

## 2021-10-31 DIAGNOSIS — B351 Tinea unguium: Secondary | ICD-10-CM | POA: Diagnosis not present

## 2021-10-31 DIAGNOSIS — L309 Dermatitis, unspecified: Secondary | ICD-10-CM | POA: Diagnosis not present

## 2021-10-31 DIAGNOSIS — I739 Peripheral vascular disease, unspecified: Secondary | ICD-10-CM | POA: Diagnosis not present

## 2021-12-26 ENCOUNTER — Ambulatory Visit (INDEPENDENT_AMBULATORY_CARE_PROVIDER_SITE_OTHER): Payer: Medicare Other

## 2021-12-26 DIAGNOSIS — I442 Atrioventricular block, complete: Secondary | ICD-10-CM

## 2021-12-26 LAB — CUP PACEART REMOTE DEVICE CHECK
Date Time Interrogation Session: 20230116121250
Implantable Lead Implant Date: 20201009
Implantable Lead Implant Date: 20201009
Implantable Lead Location: 753859
Implantable Lead Location: 753860
Implantable Lead Model: 377
Implantable Lead Model: 377
Implantable Lead Serial Number: 81023057
Implantable Lead Serial Number: 81187700
Implantable Pulse Generator Implant Date: 20201009
Pulse Gen Model: 407145
Pulse Gen Serial Number: 69608254

## 2021-12-28 DIAGNOSIS — H401130 Primary open-angle glaucoma, bilateral, stage unspecified: Secondary | ICD-10-CM | POA: Diagnosis not present

## 2021-12-28 DIAGNOSIS — H401131 Primary open-angle glaucoma, bilateral, mild stage: Secondary | ICD-10-CM | POA: Diagnosis not present

## 2022-01-05 NOTE — Progress Notes (Signed)
Remote pacemaker transmission.   

## 2022-01-16 DIAGNOSIS — B353 Tinea pedis: Secondary | ICD-10-CM | POA: Diagnosis not present

## 2022-01-16 DIAGNOSIS — I739 Peripheral vascular disease, unspecified: Secondary | ICD-10-CM | POA: Diagnosis not present

## 2022-01-16 DIAGNOSIS — B351 Tinea unguium: Secondary | ICD-10-CM | POA: Diagnosis not present

## 2022-01-16 DIAGNOSIS — M79675 Pain in left toe(s): Secondary | ICD-10-CM | POA: Diagnosis not present

## 2022-02-09 DIAGNOSIS — E039 Hypothyroidism, unspecified: Secondary | ICD-10-CM | POA: Diagnosis not present

## 2022-02-09 DIAGNOSIS — Z6827 Body mass index (BMI) 27.0-27.9, adult: Secondary | ICD-10-CM | POA: Diagnosis not present

## 2022-02-09 DIAGNOSIS — Z1331 Encounter for screening for depression: Secondary | ICD-10-CM | POA: Diagnosis not present

## 2022-02-09 DIAGNOSIS — L89301 Pressure ulcer of unspecified buttock, stage 1: Secondary | ICD-10-CM | POA: Diagnosis not present

## 2022-02-09 DIAGNOSIS — N390 Urinary tract infection, site not specified: Secondary | ICD-10-CM | POA: Diagnosis not present

## 2022-02-09 DIAGNOSIS — E663 Overweight: Secondary | ICD-10-CM | POA: Diagnosis not present

## 2022-02-17 DIAGNOSIS — N390 Urinary tract infection, site not specified: Secondary | ICD-10-CM | POA: Diagnosis not present

## 2022-02-24 DIAGNOSIS — N390 Urinary tract infection, site not specified: Secondary | ICD-10-CM | POA: Diagnosis not present

## 2022-03-02 ENCOUNTER — Ambulatory Visit (INDEPENDENT_AMBULATORY_CARE_PROVIDER_SITE_OTHER): Payer: Medicare Other | Admitting: Physician Assistant

## 2022-03-02 ENCOUNTER — Other Ambulatory Visit: Payer: Self-pay

## 2022-03-02 VITALS — BP 149/73 | HR 79

## 2022-03-02 DIAGNOSIS — R339 Retention of urine, unspecified: Secondary | ICD-10-CM | POA: Diagnosis not present

## 2022-03-02 DIAGNOSIS — R350 Frequency of micturition: Secondary | ICD-10-CM | POA: Diagnosis not present

## 2022-03-02 DIAGNOSIS — K5900 Constipation, unspecified: Secondary | ICD-10-CM | POA: Diagnosis not present

## 2022-03-02 DIAGNOSIS — N3001 Acute cystitis with hematuria: Secondary | ICD-10-CM

## 2022-03-02 DIAGNOSIS — F0393 Unspecified dementia, unspecified severity, with mood disturbance: Secondary | ICD-10-CM | POA: Diagnosis not present

## 2022-03-02 DIAGNOSIS — N390 Urinary tract infection, site not specified: Secondary | ICD-10-CM

## 2022-03-02 DIAGNOSIS — N3081 Other cystitis with hematuria: Secondary | ICD-10-CM | POA: Diagnosis not present

## 2022-03-02 DIAGNOSIS — N401 Enlarged prostate with lower urinary tract symptoms: Secondary | ICD-10-CM | POA: Diagnosis not present

## 2022-03-02 LAB — URINALYSIS, ROUTINE W REFLEX MICROSCOPIC
Bilirubin, UA: NEGATIVE
Glucose, UA: NEGATIVE
Ketones, UA: NEGATIVE
Nitrite, UA: NEGATIVE
Specific Gravity, UA: 1.015 (ref 1.005–1.030)
Urobilinogen, Ur: 0.2 mg/dL (ref 0.2–1.0)
pH, UA: 5.5 (ref 5.0–7.5)

## 2022-03-02 LAB — MICROSCOPIC EXAMINATION
Renal Epithel, UA: NONE SEEN /hpf
WBC, UA: >30 /hpf — AB (ref 0–5)

## 2022-03-02 LAB — BLADDER SCAN AMB NON-IMAGING: Scan Result: 126

## 2022-03-02 MED ORDER — DOXYCYCLINE HYCLATE 100 MG PO CAPS: 100.0000 mg | ORAL_CAPSULE | Freq: Two times a day (BID) | ORAL | 0 refills | Status: DC

## 2022-03-02 NOTE — Progress Notes (Signed)
post void residual=126 ?

## 2022-03-02 NOTE — Progress Notes (Signed)
? ?03/02/2022 ?11:29 AM  ? ?Chase Chase ?August 13, 1922 ?[id number] ? ? ?Assessment: ? ?1. Acute cystitis with hematuria   ?2. Frequent UTI   ?3. Benign prostatic hyperplasia with urinary frequency   ?4. Incomplete bladder emptying   ?5. Dementia with mood disturbance, unspecified dementia severity, unspecified dementia type   ?6. Constipation, unspecified constipation type   ? ?Plan: ?Doxycycline prescribed and and MDX culture obtained on today's urine.  Pending culture results, will adjust therapy if indicated.  Discussed with family that the patient is at risk for recurrent UTIs due to his history of BPH, constipation, dementia with some difficulty of ADLs.  The decubiti on his buttock also put him at risk.  Vigilant wound care and hygiene recommended.  Recommended follow-up in 2 weeks for recheck urinalysis and PVR.  Patient is reminded that should they note a change in the patient's current mental status, he should be evaluated in the emergency department. ? ?Chief Complaint: Recurrent UTIs ? ?Referring provider: Jake Samples, PA-C ?[address] ?[place],  [place] [address] ? ? ?History of Present Illness: ? ?Chase Chase is a 88 y.o. year old male with dementia who is seen in consultation from Sharilyn Sites, MD for evaluation of recurrent UTIs.  The patient presents with his son-in-law today who shares most of the patient's history due to the patient's mental capacity.  He reports the patient has approximately 3-4 urinary tract infections per year for the past 3 years and has been admitted with urosepsis in the past.  Most recently, the patient had a positive culture for Enterococcus faecalis which was treated with Cipro.  Patient has been off this antibiotic for approximately 3 weeks.  Currently, patient admits to weakness of stream, frequency of urination, urgency with difficulty holding urine which leads to incontinence.  He denies burning or dysuria.  No gross hematuria.  The patient voids 4-5  times per night.  Son-in-law states he has had no recent fevers or change in mental status.  The patient lives alone and has a "lady who helps" to bathe him twice weekly.  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  His son also reports a long history of lower extremity edema and dizziness.  Medical records reviewed from [facility] also indicates history of BPH with lower urinary tract symptoms.  The patient's most recent ED visit for UTI it was was in October 2022. Medication history indicates no prostate or urinary drugs other than the over-the-counter herbal supplements..  Patient's son-in-law reports he has taken super beta p over-the-counter for prostate health and also to takes probiotics for history of chronic constipation.  Medical records from [facility] reviewed as well as past history in the epic system.  No urinary or abdominal imaging noted. ?Recent culture history: ?09/06/2021 Enterococcus faecalis greater than 100,000 colonies sensitive to ampicillin, Macrobid, Vanc ?02/15/2022 Enterococcus faecalis greater than 100,000 colonies sensitive to Cipro, Levaquin, Macrobid, penicillin, tetracycline, resistant to vancomycin ? ?IP SS score unable to obtain due to patient's cognitive inability to complete ?PVR equals 126 mL ?Urine greater than 30 WBCs 11-30 RBCs, moderate bacteria, nitrite negative ? ?Past Medical History:  ?Past Medical History:  ?Diagnosis Date  ? At risk of UTI   ? CHB (complete heart block) ([place]) 09/2019  ? CHB (complete heart block) ([place]) 09/2019  ? Glaucoma   ? Glaucoma   ? HOH (hard of hearing)   ? Hypercholesterolemia   ?  Pacemaker 09/19/2019  ? Biotronik dual chamber PPM  ? Prostate disease   ? unspecified  ? Vertigo   ? ? ?Past Surgical History:  ?Past Surgical History:  ?Procedure Laterality Date  ? CHOLECYSTECTOMY    ? HERNIA REPAIR    ? bilateral  ? PACEMAKER IMPLANT N/A 09/19/2019  ? Procedure: PACEMAKER  IMPLANT;  Surgeon: Evans Lance, MD;  Location: [place] [facility];  Service: Cardiovascular;  Laterality: N/A;  ? ? ?Allergies:  ?No Known Allergies ? ?Family History:  ?Family History  ?Problem Relation Age of Onset  ? Breast cancer Sister   ? Lung cancer Brother   ? Lung cancer Brother   ? Lung cancer Brother   ? Breast cancer Sister   ? Breast cancer Sister   ? ? ?Social History:  ?Social History  ? ?Tobacco Use  ? Smoking status: Former  ?  Packs/day: 2.00  ?  Years: 8.00  ?  Pack years: 16.00  ?  Types: Cigarettes  ? Smokeless tobacco: Former  ?  Types: Chew  ?  Quit date: 10/12/2011  ? Tobacco comments:  ?  Chewing and snuff  ?Vaping Use  ? Vaping Use: Never used  ?Substance Use Topics  ? Alcohol use: No  ? Drug use: No  ? ? ?Review of symptoms:  ?Constitutional:  Negative for unexplained weight loss, night sweats, fever, chills ?ENT:  Negative for nose bleeds, sinus pain, painful swallowing ?CV:  Negative for chest pain ?Resp:  Negative for cough, wheezing, shortness of breath ?GI:  Negative for nausea, vomiting, diarrhea, bloody stools ?GU:  Positives noted in HPI; otherwise negative for gross hematuria ?Neuro:  Negative for seizures ?Psych:  Negative for anxiety ?Endocrine:  Negative for polydipsia, polyuria, symptoms of hypoglycemia (dizziness, hunger, sweating) ?Hematologic:  Negative for anemia, purpura, petechia, prolonged or excessive bleeding, use of anticoagulants  ? ?Physical Exam: ?BP (!) 149/73   Pulse 79   ?Constitutional:  Alert and oriented, No acute distress. ?HEENT: [place]AT, moist mucus membranes.  Trachea midline, no masses. ?Cardiovascular: Regular rate and rhythm without murmur, rub, or gallops ?No clubbing, cyanosis.  Mild nonpitting lower extremity edema ?Respiratory: Normal respiratory effort, clear to auscultation bilaterally ?GI: Abdomen is soft, nontender, nondistended, no abdominal masses ?BACK:  Non-tender to palpation.  No CVAT ?Skin: No obvious rashes, warm, dry,  intact ?Neurologic: Alert and responsive to questions but speaks as if he is mildly confused.  Good motion of upper and lower extremities.  Ambulates with mild antalgia, slowly, more stable with assistance from the son-in-law ?Psychiatric: Appropriate ? ?Laboratory Data: ?No results found for this or any previous visit (from the past 24 hour(s)). ? ?Lab Results  ?Component Value Date  ? WBC 6.5 09/13/2021  ? HGB 16.0 09/13/2021  ? HCT 47.5 09/13/2021  ? MCV 103.9 (H) 09/13/2021  ? PLT 190 09/13/2021  ? ? ?Lab Results  ?Component Value Date  ? CREATININE 1.23 09/13/2021  ? ? ?Urinalysis ?   ?Component Value Date/Time  ? COLORURINE YELLOW 09/13/2021 1601  ? APPEARANCEUR CLEAR 09/13/2021 1601  ? LABSPEC 1.014 09/13/2021 1601  ? PHURINE 5.0 09/13/2021 1601  ? GLUCOSEU 50 (A) 09/13/2021 1601  ? HGBUR SMALL (A) 09/13/2021 1601  ? [place] NEGATIVE 09/13/2021 1601  ? [place] NEGATIVE 09/13/2021 1601  ? PROTEINUR 30 (A) 09/13/2021 1601  ? UROBILINOGEN 0.2 04/08/2013 1815  ? NITRITE NEGATIVE 09/13/2021 1601  ? LEUKOCYTESUR MODERATE (A) 09/13/2021 1601  ? ? ?Lab Results  ?Component Value Date  ?  BACTERIA NONE SEEN 09/13/2021  ? ? ?Pertinent Imaging: ?Results for orders placed in visit on 09/05/01 ? ?DG Abd 1 View ? ?Narrative ?FINDINGS ?CLINICAL DATA:  RIGHT RENAL CALCULUS. ?SINGLE VIEW ABDOMEN: ?BILATERAL INTRARENAL CALCULI ARE SEEN.  THE LARGEST CALCULUS IN THE REGION OF THE MID POLE OF THE ?LEFT KIDNEY MEASURES 12 X 8 MM.  PELVIC VASCULAR CALCIFICATION IS ALSO NOTED BUT NO DEFINITE ?URETERAL CALCULI ARE SEEN.  SURGICAL CLIPS ARE SEEN IN THE UPPER RIGHT ABDOMEN. ?IMPRESSION ?BILATERAL INTRARENAL CALCULI, LARGEST IN THE LEFT KIDNEY MEASURING 12 MM. ? ?Results for orders placed during the hospital encounter of 09/06/21 ? ?US Venous Img Lower Bilateral ? ?Narrative ?CLINICAL DATA:  Bilateral lower extremity weakness and swelling. ? ?EXAM: ?BILATERAL LOWER EXTREMITY VENOUS DOPPLER ULTRASOUND ? ?TECHNIQUE: ?Gray-scale  sonography with graded compression, as well as color ?Doppler and duplex ultrasound were performed to evaluate the lower ?extremity deep venous systems from the level of the common femoral ?vein and including the common femor

## 2022-03-06 ENCOUNTER — Other Ambulatory Visit: Payer: Self-pay | Admitting: Physician Assistant

## 2022-03-07 ENCOUNTER — Telehealth: Payer: Self-pay

## 2022-03-07 ENCOUNTER — Other Ambulatory Visit: Payer: Self-pay | Admitting: Physician Assistant

## 2022-03-07 MED ORDER — AMOXICILLIN-POT CLAVULANATE 875-125 MG PO TABS: 1.0000 | ORAL_TABLET | Freq: Two times a day (BID) | ORAL | 0 refills | Status: DC

## 2022-03-07 NOTE — Telephone Encounter (Signed)
Daughter aware, d/c doxy and will start Augmentin. ?

## 2022-03-07 NOTE — Telephone Encounter (Signed)
-----   Message from Reynaldo Minium, [place] sent at 03/07/2022  9:06 AM EDT ----- ?Please let the patient know that his culture results indicate resistance to doxycycline which she was prescribed.  A new prescription for Augmentin was sent to his pharmacy.  He needs to DC the Doxy and begin Augmentin as soon as possible today. ? ?

## 2022-03-07 NOTE — Progress Notes (Signed)
MDX culture indicates resistance to doxycycline which the patient was placed on.  Message sent to patient to discontinue Doxy and begin Augmentin as prescribed. ?

## 2022-03-07 NOTE — Telephone Encounter (Signed)
Patient's daughter called advising that they pharmacy did not receive the antibiotic. Patient advised she has a hard time with [facility]. If we could send it to [facility] on [address]  ?

## 2022-03-08 ENCOUNTER — Other Ambulatory Visit: Payer: Self-pay

## 2022-03-08 DIAGNOSIS — N390 Urinary tract infection, site not specified: Secondary | ICD-10-CM

## 2022-03-08 MED ORDER — AMOXICILLIN-POT CLAVULANATE 875-125 MG PO TABS: 1.0000 | ORAL_TABLET | Freq: Two times a day (BID) | ORAL | 0 refills | Status: DC

## 2022-03-08 NOTE — Telephone Encounter (Signed)
Patient wants antibiotic to be called into  ?[facility] - [address] ?Phone: [phone] ? ?Thanks, ?Helene Kelp ?

## 2022-03-08 NOTE — Telephone Encounter (Signed)
Resent to [facility], daughter aware.  Rx canceled with [facility]. ?

## 2022-03-15 ENCOUNTER — Ambulatory Visit (INDEPENDENT_AMBULATORY_CARE_PROVIDER_SITE_OTHER): Payer: Medicare Other | Admitting: Urology

## 2022-03-15 VITALS — BP 152/77 | HR 78

## 2022-03-15 DIAGNOSIS — N390 Urinary tract infection, site not specified: Secondary | ICD-10-CM | POA: Diagnosis not present

## 2022-03-15 DIAGNOSIS — N401 Enlarged prostate with lower urinary tract symptoms: Secondary | ICD-10-CM | POA: Diagnosis not present

## 2022-03-15 DIAGNOSIS — R350 Frequency of micturition: Secondary | ICD-10-CM

## 2022-03-15 DIAGNOSIS — N3021 Other chronic cystitis with hematuria: Secondary | ICD-10-CM | POA: Diagnosis not present

## 2022-03-15 DIAGNOSIS — N3001 Acute cystitis with hematuria: Secondary | ICD-10-CM | POA: Diagnosis not present

## 2022-03-15 DIAGNOSIS — R339 Retention of urine, unspecified: Secondary | ICD-10-CM | POA: Diagnosis not present

## 2022-03-15 LAB — URINALYSIS, ROUTINE W REFLEX MICROSCOPIC
Bilirubin, UA: NEGATIVE
Glucose, UA: NEGATIVE
Ketones, UA: NEGATIVE
Nitrite, UA: NEGATIVE
Specific Gravity, UA: 1.015 (ref 1.005–1.030)
Urobilinogen, Ur: 0.2 mg/dL (ref 0.2–1.0)
pH, UA: 6 (ref 5.0–7.5)

## 2022-03-15 LAB — MICROSCOPIC EXAMINATION
Epithelial Cells (non renal): NONE SEEN /hpf (ref 0–10)
Renal Epithel, UA: NONE SEEN /hpf
WBC, UA: >30 /hpf — AB (ref 0–5)

## 2022-03-15 MED ORDER — CIPROFLOXACIN HCL 500 MG PO TABS: 500.0000 mg | ORAL_TABLET | Freq: Once | ORAL | Status: DC

## 2022-03-15 MED ORDER — ALFUZOSIN HCL ER 10 MG PO TB24: 10.0000 mg | ORAL_TABLET | Freq: Every day | ORAL | 11 refills | Status: DC

## 2022-03-15 NOTE — Progress Notes (Signed)
? ?03/15/2022 ?9:11 AM  ? ?Chase Chase ?08-Sep-1922 ?[id number] ? ?Referring provider: Sharilyn Sites, MD ?[address] ?[place],  [place] [address] ? ?Recurrent UTI ? ?HPI: ?Mr Chase Chase is a 88yo here for evaluation of recurrent UTI? For the past several years he has had 3-4 UTIs per year. His PVRS have remained elevated at 150-200cc. IPSS 27 QOl 5. He has a weak urinary stream, straining to urinate and nocturia 3-5x. He was on alpha blockers several years ago and does not recall if they improved his LUTS. He does have occasional dizziness. NO recent abdominal imaging.  ? ? ?PMH: ?Past Medical History:  ?Diagnosis Date  ? At risk of UTI   ? CHB (complete heart block) ([place]) 09/2019  ? CHB (complete heart block) ([place]) 09/2019  ? Glaucoma   ? Glaucoma   ? HOH (hard of hearing)   ? Hypercholesterolemia   ? Pacemaker 09/19/2019  ? Biotronik dual chamber PPM  ? Prostate disease   ? unspecified  ? Vertigo   ? ? ?Surgical History: ?Past Surgical History:  ?Procedure Laterality Date  ? CHOLECYSTECTOMY    ? HERNIA REPAIR    ? bilateral  ? PACEMAKER IMPLANT N/A 09/19/2019  ? Procedure: PACEMAKER IMPLANT;  Surgeon: Evans Lance, MD;  Location: [facility] [facility];  Service: Cardiovascular;  Laterality: N/A;  ? ? ?Home Medications:  ?Allergies as of 03/15/2022   ?No Known Allergies ?  ? ?  ?Medication List  ?  ? ?  ? Accurate as of March 15, 2022  9:11 AM. If you have any questions, ask your nurse or doctor.  ?  ?  ? ?  ? ?amoxicillin-clavulanate 875-125 MG tablet ?Commonly known as: AUGMENTIN ?Take 1 tablet by mouth every 12 (twelve) hours. ?  ?Fish Oil 645 MG Caps ?Take 1,290 mg by mouth daily. Take 2 capsules daily for cholesterol. ?  ?latanoprost 0.005 % ophthalmic solution ?Commonly known as: XALATAN ?Place 1 drop into both eyes at bedtime as needed. ?  ?levothyroxine 50 MCG tablet ?Commonly known as: SYNTHROID ?Take 50 mcg by mouth daily. ?  ?meclizine 25 MG tablet ?Commonly known as: ANTIVERT ?Take 25 mg by mouth as  needed for dizziness (rarely needs). ?  ?naproxen sodium 220 MG tablet ?Commonly known as: ALEVE ?Take 220 mg by mouth daily as needed. ?  ?PROSTATE HEALTH PO ?Take 2 tablets by mouth daily. ?  ?Vitamin D (Ergocalciferol) 1.25 MG (50000 UNIT) Caps capsule ?Commonly known as: DRISDOL ?Take 1 capsule by mouth every 30 (thirty) days. ?  ?zinc gluconate 50 MG tablet ?Take 50 mg by mouth daily. ?  ? ?  ? ? ?Allergies: No Known Allergies ? ?Family History: ?Family History  ?Problem Relation Age of Onset  ? Breast cancer Sister   ? Lung cancer Brother   ? Lung cancer Brother   ? Lung cancer Brother   ? Breast cancer Sister   ? Breast cancer Sister   ? ? ?Social History:  reports that he has quit smoking. His smoking use included cigarettes. He has a 16.00 pack-year smoking history. He quit smokeless tobacco use about 10 years ago.  His smokeless tobacco use included chew. He reports that he does not drink alcohol and does not use drugs. ? ?ROS: ?All other review of systems were reviewed and are negative except what is noted above in HPI ? ?Physical Exam: ?BP (!) 152/77   Pulse 78   ?Constitutional:  Alert and oriented, No acute distress. ?HEENT:  [place] AT, moist mucus membranes.  Trachea midline, no masses. ?Cardiovascular: No clubbing, cyanosis, or edema. ?Respiratory: Normal respiratory effort, no increased work of breathing. ?GI: Abdomen is soft, nontender, nondistended, no abdominal masses ?GU: No CVA tenderness.  ?Lymph: No cervical or inguinal lymphadenopathy. ?Skin: No rashes, bruises or suspicious lesions. ?Neurologic: Grossly intact, no focal deficits, moving all 4 extremities. ?Psychiatric: Normal mood and affect. ? ?Laboratory Data: ?Lab Results  ?Component Value Date  ? WBC 6.5 09/13/2021  ? HGB 16.0 09/13/2021  ? HCT 47.5 09/13/2021  ? MCV 103.9 (H) 09/13/2021  ? PLT 190 09/13/2021  ? ? ?Lab Results  ?Component Value Date  ? CREATININE 1.23 09/13/2021  ? ? ?No results found for: PSA ? ?No results found for:  TESTOSTERONE ? ?No results found for: HGBA1C ? ?Urinalysis ?   ?Component Value Date/Time  ? COLORURINE YELLOW 09/13/2021 1601  ? APPEARANCEUR Clear 03/02/2022 1508  ? LABSPEC 1.014 09/13/2021 1601  ? PHURINE 5.0 09/13/2021 1601  ? GLUCOSEU Negative 03/02/2022 1508  ? HGBUR SMALL (A) 09/13/2021 1601  ? BILIRUBINUR Negative 03/02/2022 1508  ? [place] NEGATIVE 09/13/2021 1601  ? PROTEINUR 2+ (A) 03/02/2022 1508  ? PROTEINUR 30 (A) 09/13/2021 1601  ? UROBILINOGEN 0.2 04/08/2013 1815  ? NITRITE Negative 03/02/2022 1508  ? NITRITE NEGATIVE 09/13/2021 1601  ? LEUKOCYTESUR 3+ (A) 03/02/2022 1508  ? LEUKOCYTESUR MODERATE (A) 09/13/2021 1601  ? ? ?Lab Results  ?Component Value Date  ? LABMICR See below: 03/02/2022  ? [place] >30 (A) 03/02/2022  ? LABEPIT 0-10 03/02/2022  ? MUCUS Present 03/02/2022  ? BACTERIA Moderate (A) 03/02/2022  ? ? ?Pertinent Imaging: ? ?Results for orders placed in visit on 09/05/01 ? ?DG Abd 1 View ? ?Narrative ?FINDINGS ?CLINICAL DATA:  RIGHT RENAL CALCULUS. ?SINGLE VIEW ABDOMEN: ?BILATERAL INTRARENAL CALCULI ARE SEEN.  THE LARGEST CALCULUS IN THE REGION OF THE MID POLE OF THE ?LEFT KIDNEY MEASURES 12 X 8 MM.  PELVIC VASCULAR CALCIFICATION IS ALSO NOTED BUT NO DEFINITE ?URETERAL CALCULI ARE SEEN.  SURGICAL CLIPS ARE SEEN IN THE UPPER RIGHT ABDOMEN. ?IMPRESSION ?BILATERAL INTRARENAL CALCULI, LARGEST IN THE LEFT KIDNEY MEASURING 12 MM. ? ?Results for orders placed during the hospital encounter of 09/06/21 ? ?US Venous Img Lower Bilateral ? ?Narrative ?CLINICAL DATA:  Bilateral lower extremity weakness and swelling. ? ?EXAM: ?BILATERAL LOWER EXTREMITY VENOUS DOPPLER ULTRASOUND ? ?TECHNIQUE: ?Gray-scale sonography with graded compression, as well as color ?Doppler and duplex ultrasound were performed to evaluate the lower ?extremity deep venous systems from the level of the common femoral ?vein and including the common femoral, femoral, profunda femoral, ?popliteal and calf veins including the posterior  tibial, peroneal ?and gastrocnemius veins when visible. The superficial great ?saphenous vein was also interrogated. Spectral Doppler was utilized ?to evaluate flow at rest and with distal augmentation maneuvers in ?the common femoral, femoral and popliteal veins. ? ?COMPARISON:  None. ? ?FINDINGS: ?RIGHT LOWER EXTREMITY ? ?Common Femoral Vein: No evidence of thrombus. Normal ?compressibility, respiratory phasicity and response to augmentation. ? ?Saphenofemoral Junction: No evidence of thrombus. Normal ?compressibility and flow on color Doppler imaging. ? ?Profunda Femoral Vein: No evidence of thrombus. Normal ?compressibility and flow on color Doppler imaging. ? ?Femoral Vein: No evidence of thrombus. Normal compressibility, ?respiratory phasicity and response to augmentation. ? ?Popliteal Vein: No evidence of thrombus. Normal compressibility, ?respiratory phasicity and response to augmentation. ? ?Calf Veins: Visualized right deep calf veins are patent without ?thrombus. ? ?Superficial Great Saphenous Vein: No evidence of thrombus. Normal ?compressibility. ? ?  LEFT LOWER EXTREMITY ? ?Common Femoral Vein: No evidence of thrombus. Normal ?compressibility, respiratory phasicity and response to augmentation. ? ?Saphenofemoral Junction: No evidence of thrombus. Normal ?compressibility and flow on color Doppler imaging. ? ?Profunda Femoral Vein: No evidence of thrombus. Normal ?compressibility and flow on color Doppler imaging. ? ?Femoral Vein: No evidence of thrombus. Normal compressibility, ?respiratory phasicity and response to augmentation. ? ?Popliteal Vein: No evidence of thrombus. Normal compressibility, ?respiratory phasicity and response to augmentation. ? ?Calf Veins: Visualized left deep calf veins are patent without ?thrombus. ? ?Superficial Great Saphenous Vein: No evidence of thrombus. Normal ?compressibility. ? ?Other Findings:  Bilateral ankle edema. ? ?IMPRESSION: ?No evidence of deep venous thrombosis  in either lower extremity. ? ? ?Electronically Signed ?By: Markus Daft M.D. ?On: 09/06/2021 17:39 ? ?No results found for this or any previous visit. ? ?No results found for this or any previous visit. ? ?No re

## 2022-03-16 LAB — BASIC METABOLIC PANEL
BUN/Creatinine Ratio: 14 (ref 10–24)
BUN: 16 mg/dL (ref 10–36)
CO2: 23 mmol/L (ref 20–29)
Calcium: 8.9 mg/dL (ref 8.6–10.2)
Chloride: 100 mmol/L (ref 96–106)
Creatinine, Ser: 1.15 mg/dL (ref 0.76–1.27)
Glucose: 168 mg/dL — ABNORMAL HIGH (ref 70–99)
Potassium: 4.1 mmol/L (ref 3.5–5.2)
Sodium: 140 mmol/L (ref 134–144)
eGFR: 58 mL/min/{1.73_m2} — ABNORMAL LOW (ref >59)

## 2022-03-17 LAB — URINE CULTURE: Organism ID, Bacteria: NO GROWTH

## 2022-03-21 NOTE — Progress Notes (Signed)
Daughter aware.

## 2022-03-27 ENCOUNTER — Ambulatory Visit (INDEPENDENT_AMBULATORY_CARE_PROVIDER_SITE_OTHER): Payer: Medicare Other

## 2022-03-27 DIAGNOSIS — I442 Atrioventricular block, complete: Secondary | ICD-10-CM

## 2022-03-28 LAB — CUP PACEART REMOTE DEVICE CHECK
Date Time Interrogation Session: 20230417140713
Implantable Lead Implant Date: 20201009
Implantable Lead Implant Date: 20201009
Implantable Lead Location: 753859
Implantable Lead Location: 753860
Implantable Lead Model: 377
Implantable Lead Model: 377
Implantable Lead Serial Number: 81023057
Implantable Lead Serial Number: 81187700
Implantable Pulse Generator Implant Date: 20201009
Pulse Gen Model: 407145
Pulse Gen Serial Number: 69608254

## 2022-04-03 ENCOUNTER — Ambulatory Visit (HOSPITAL_COMMUNITY)
Admission: RE | Admit: 2022-04-03 | Discharge: 2022-04-03 | Disposition: A | Discharge status: Home / Self Care | Payer: Medicare Other | Source: Ambulatory Visit | Attending: Urology | Admitting: Urology

## 2022-04-03 DIAGNOSIS — N400 Enlarged prostate without lower urinary tract symptoms: Secondary | ICD-10-CM | POA: Diagnosis not present

## 2022-04-03 DIAGNOSIS — N3021 Other chronic cystitis with hematuria: Secondary | ICD-10-CM | POA: Insufficient documentation

## 2022-04-03 DIAGNOSIS — N1330 Unspecified hydronephrosis: Secondary | ICD-10-CM | POA: Diagnosis not present

## 2022-04-03 DIAGNOSIS — N281 Cyst of kidney, acquired: Secondary | ICD-10-CM | POA: Diagnosis not present

## 2022-04-03 DIAGNOSIS — N200 Calculus of kidney: Secondary | ICD-10-CM | POA: Diagnosis not present

## 2022-04-03 MED ORDER — IOHEXOL 300 MG/ML  SOLN
100.0000 mL | Freq: Once | INTRAMUSCULAR | Status: AC | PRN
  Administered 2022-04-03: 100 mL via INTRAVENOUS

## 2022-04-07 ENCOUNTER — Other Ambulatory Visit: Payer: Medicare Other

## 2022-04-12 NOTE — Progress Notes (Signed)
Remote pacemaker transmission.   

## 2022-04-14 ENCOUNTER — Ambulatory Visit: Payer: Medicare Other | Admitting: Urology

## 2022-04-17 ENCOUNTER — Ambulatory Visit (INDEPENDENT_AMBULATORY_CARE_PROVIDER_SITE_OTHER): Payer: Medicare Other | Admitting: Urology

## 2022-04-17 ENCOUNTER — Encounter: Payer: Self-pay | Admitting: Urology

## 2022-04-17 VITALS — BP 113/64 | HR 80

## 2022-04-17 DIAGNOSIS — N401 Enlarged prostate with lower urinary tract symptoms: Secondary | ICD-10-CM | POA: Diagnosis not present

## 2022-04-17 DIAGNOSIS — N200 Calculus of kidney: Secondary | ICD-10-CM

## 2022-04-17 DIAGNOSIS — R339 Retention of urine, unspecified: Secondary | ICD-10-CM | POA: Diagnosis not present

## 2022-04-17 DIAGNOSIS — Z87440 Personal history of urinary (tract) infections: Secondary | ICD-10-CM | POA: Diagnosis not present

## 2022-04-17 DIAGNOSIS — R350 Frequency of micturition: Secondary | ICD-10-CM

## 2022-04-17 DIAGNOSIS — N390 Urinary tract infection, site not specified: Secondary | ICD-10-CM | POA: Diagnosis not present

## 2022-04-17 LAB — MICROSCOPIC EXAMINATION
Renal Epithel, UA: NONE SEEN /hpf
WBC, UA: >30 /hpf — AB (ref 0–5)

## 2022-04-17 LAB — URINALYSIS, ROUTINE W REFLEX MICROSCOPIC
Bilirubin, UA: NEGATIVE
Glucose, UA: NEGATIVE
Ketones, UA: NEGATIVE
Nitrite, UA: NEGATIVE
Specific Gravity, UA: 1.025 (ref 1.005–1.030)
Urobilinogen, Ur: 0.2 mg/dL (ref 0.2–1.0)
pH, UA: 5.5 (ref 5.0–7.5)

## 2022-04-17 LAB — BLADDER SCAN AMB NON-IMAGING: Scan Result: 50

## 2022-04-17 NOTE — Progress Notes (Signed)
? ?04/17/2022 ?11:19 AM  ? ?Chase Chase ?1922/10/29 ?[id number] ? ?Referring provider: Sharilyn Sites, MD ?[address] ?[place],  [place] [address] ? ?Followup BPH and recurrent UTI ? ? ?HPI: ?Mr Barletta is a 88yo here for followup for BPH with incomplete emptying and recurrent UTI. He was started on uroxatral '10mg'$  last visit and his LUTS have significantly improved. IPSS 4 QOl 0. Urine stream strong. Nocturia 1x. No dysuria or hematuria. No other complaints today ? ? ?PMH: ?Past Medical History:  ?Diagnosis Date  ? At risk of UTI   ? CHB (complete heart block) ([facility]) 09/2019  ? CHB (complete heart block) ([facility]) 09/2019  ? Glaucoma   ? Glaucoma   ? HOH (hard of hearing)   ? Hypercholesterolemia   ? Pacemaker 09/19/2019  ? Biotronik dual chamber PPM  ? Prostate disease   ? unspecified  ? Vertigo   ? ? ?Surgical History: ?Past Surgical History:  ?Procedure Laterality Date  ? CHOLECYSTECTOMY    ? HERNIA REPAIR    ? bilateral  ? PACEMAKER IMPLANT N/A 09/19/2019  ? Procedure: PACEMAKER IMPLANT;  Surgeon: Evans Lance, MD;  Location: [facility];  Service: Cardiovascular;  Laterality: N/A;  ? ? ?Home Medications:  ?Allergies as of 04/17/2022   ?No Known Allergies ?  ? ?  ?Medication List  ?  ? ?  ? Accurate as of Apr 17, 2022 11:19 AM. If you have any questions, ask your nurse or doctor.  ?  ?  ? ?  ? ?alfuzosin 10 MG 24 hr tablet ?Commonly known as: UROXATRAL ?Take 1 tablet (10 mg total) by mouth at bedtime. ?  ?amoxicillin-clavulanate 875-125 MG tablet ?Commonly known as: AUGMENTIN ?Take 1 tablet by mouth every 12 (twelve) hours. ?  ?Fish Oil 645 MG Caps ?Take 1,290 mg by mouth daily. Take 2 capsules daily for cholesterol. ?  ?latanoprost 0.005 % ophthalmic solution ?Commonly known as: XALATAN ?Place 1 drop into both eyes at bedtime as needed. ?  ?levothyroxine 50 MCG tablet ?Commonly known as: SYNTHROID ?Take 50 mcg by mouth daily. ?  ?meclizine 25 MG tablet ?Commonly known as: ANTIVERT ?Take 25 mg by mouth as  needed for dizziness (rarely needs). ?  ?naproxen sodium 220 MG tablet ?Commonly known as: ALEVE ?Take 220 mg by mouth daily as needed. ?  ?PROSTATE HEALTH PO ?Take 2 tablets by mouth daily. ?  ?Vitamin D (Ergocalciferol) 1.25 MG (50000 UNIT) Caps capsule ?Commonly known as: DRISDOL ?Take 1 capsule by mouth every 30 (thirty) days. ?  ?zinc gluconate 50 MG tablet ?Take 50 mg by mouth daily. ?  ? ?  ? ? ?Allergies: No Known Allergies ? ?Family History: ?Family History  ?Problem Relation Age of Onset  ? Breast cancer Sister   ? Lung cancer Brother   ? Lung cancer Brother   ? Lung cancer Brother   ? Breast cancer Sister   ? Breast cancer Sister   ? ? ?Social History:  reports that he has quit smoking. His smoking use included cigarettes. He has a 16.00 pack-year smoking history. He quit smokeless tobacco use about 10 years ago.  His smokeless tobacco use included chew. He reports that he does not drink alcohol and does not use drugs. ? ?ROS: ?All other review of systems were reviewed and are negative except what is noted above in HPI ? ?Physical Exam: ?BP 113/64   Pulse 80   ?Constitutional:  Alert and oriented, No acute distress. ?HEENT: [place] AT,  moist mucus membranes.  Trachea midline, no masses. ?Cardiovascular: No clubbing, cyanosis, or edema. ?Respiratory: Normal respiratory effort, no increased work of breathing. ?GI: Abdomen is soft, nontender, nondistended, no abdominal masses ?GU: No CVA tenderness.  ?Lymph: No cervical or inguinal lymphadenopathy. ?Skin: No rashes, bruises or suspicious lesions. ?Neurologic: Grossly intact, no focal deficits, moving all 4 extremities. ?Psychiatric: Normal mood and affect. ? ?Laboratory Data: ?Lab Results  ?Component Value Date  ? WBC 6.5 09/13/2021  ? HGB 16.0 09/13/2021  ? HCT 47.5 09/13/2021  ? MCV 103.9 (H) 09/13/2021  ? PLT 190 09/13/2021  ? ? ?Lab Results  ?Component Value Date  ? CREATININE 1.15 03/15/2022  ? ? ?No results found for: PSA ? ?No results found for:  TESTOSTERONE ? ?No results found for: HGBA1C ? ?Urinalysis ?   ?Component Value Date/Time  ? COLORURINE YELLOW 09/13/2021 1601  ? APPEARANCEUR Clear 03/15/2022 0859  ? LABSPEC 1.014 09/13/2021 1601  ? PHURINE 5.0 09/13/2021 1601  ? GLUCOSEU Negative 03/15/2022 0859  ? HGBUR SMALL (A) 09/13/2021 1601  ? BILIRUBINUR Negative 03/15/2022 0859  ? [place] NEGATIVE 09/13/2021 1601  ? PROTEINUR 1+ (A) 03/15/2022 0859  ? PROTEINUR 30 (A) 09/13/2021 1601  ? UROBILINOGEN 0.2 04/08/2013 1815  ? NITRITE Negative 03/15/2022 0859  ? NITRITE NEGATIVE 09/13/2021 1601  ? LEUKOCYTESUR 2+ (A) 03/15/2022 0859  ? LEUKOCYTESUR MODERATE (A) 09/13/2021 1601  ? ? ?Lab Results  ?Component Value Date  ? LABMICR See below: 03/15/2022  ? WBCUA >30 (A) 03/15/2022  ? LABEPIT None seen 03/15/2022  ? MUCUS Present 03/15/2022  ? BACTERIA Few 03/15/2022  ? ? ?Pertinent Imaging: ? ?Results for orders placed in visit on 09/05/01 ? ?DG Abd 1 View ? ?Narrative ?FINDINGS ?CLINICAL DATA:  RIGHT RENAL CALCULUS. ?SINGLE VIEW ABDOMEN: ?BILATERAL INTRARENAL CALCULI ARE SEEN.  THE LARGEST CALCULUS IN THE REGION OF THE MID POLE OF THE ?LEFT KIDNEY MEASURES 12 X 8 MM.  PELVIC VASCULAR CALCIFICATION IS ALSO NOTED BUT NO DEFINITE ?URETERAL CALCULI ARE SEEN.  SURGICAL CLIPS ARE SEEN IN THE UPPER RIGHT ABDOMEN. ?IMPRESSION ?BILATERAL INTRARENAL CALCULI, LARGEST IN THE LEFT KIDNEY MEASURING 12 MM. ? ?Results for orders placed during the hospital encounter of 09/06/21 ? ?US Venous Img Lower Bilateral ? ?Narrative ?CLINICAL DATA:  Bilateral lower extremity weakness and swelling. ? ?EXAM: ?BILATERAL LOWER EXTREMITY VENOUS DOPPLER ULTRASOUND ? ?TECHNIQUE: ?Gray-scale sonography with graded compression, as well as color ?Doppler and duplex ultrasound were performed to evaluate the lower ?extremity deep venous systems from the level of the common femoral ?vein and including the common femoral, femoral, profunda femoral, ?popliteal and calf veins including the posterior  tibial, peroneal ?and gastrocnemius veins when visible. The superficial great ?saphenous vein was also interrogated. Spectral Doppler was utilized ?to evaluate flow at rest and with distal augmentation maneuvers in ?the common femoral, femoral and popliteal veins. ? ?COMPARISON:  None. ? ?FINDINGS: ?RIGHT LOWER EXTREMITY ? ?Common Femoral Vein: No evidence of thrombus. Normal ?compressibility, respiratory phasicity and response to augmentation. ? ?Saphenofemoral Junction: No evidence of thrombus. Normal ?compressibility and flow on color Doppler imaging. ? ?Profunda Femoral Vein: No evidence of thrombus. Normal ?compressibility and flow on color Doppler imaging. ? ?Femoral Vein: No evidence of thrombus. Normal compressibility, ?respiratory phasicity and response to augmentation. ? ?Popliteal Vein: No evidence of thrombus. Normal compressibility, ?respiratory phasicity and response to augmentation. ? ?Calf Veins: Visualized right deep calf veins are patent without ?thrombus. ? ?Superficial Great Saphenous Vein: No evidence of thrombus. Normal ?compressibility. ? ?LEFT  LOWER EXTREMITY ? ?Common Femoral Vein: No evidence of thrombus. Normal ?compressibility, respiratory phasicity and response to augmentation. ? ?Saphenofemoral Junction: No evidence of thrombus. Normal ?compressibility and flow on color Doppler imaging. ? ?Profunda Femoral Vein: No evidence of thrombus. Normal ?compressibility and flow on color Doppler imaging. ? ?Femoral Vein: No evidence of thrombus. Normal compressibility, ?respiratory phasicity and response to augmentation. ? ?Popliteal Vein: No evidence of thrombus. Normal compressibility, ?respiratory phasicity and response to augmentation. ? ?Calf Veins: Visualized left deep calf veins are patent without ?thrombus. ? ?Superficial Great Saphenous Vein: No evidence of thrombus. Normal ?compressibility. ? ?Other Findings:  Bilateral ankle edema. ? ?IMPRESSION: ?No evidence of deep venous thrombosis  in either lower extremity. ? ? ?Electronically Signed ?By: Markus Daft M.D. ?On: 09/06/2021 17:39 ? ?No results found for this or any previous visit. ? ?No results found for this or any previous visit. ? ?No resul

## 2022-04-17 NOTE — Patient Instructions (Signed)

## 2022-04-17 NOTE — Progress Notes (Signed)
post void residual =50 

## 2022-04-20 DIAGNOSIS — R42 Dizziness and giddiness: Secondary | ICD-10-CM | POA: Diagnosis not present

## 2022-04-20 DIAGNOSIS — E663 Overweight: Secondary | ICD-10-CM | POA: Diagnosis not present

## 2022-04-20 DIAGNOSIS — Z6826 Body mass index (BMI) 26.0-26.9, adult: Secondary | ICD-10-CM | POA: Diagnosis not present

## 2022-04-20 DIAGNOSIS — R0602 Shortness of breath: Secondary | ICD-10-CM | POA: Diagnosis not present

## 2022-04-24 DIAGNOSIS — M79675 Pain in left toe(s): Secondary | ICD-10-CM | POA: Diagnosis not present

## 2022-04-24 DIAGNOSIS — I739 Peripheral vascular disease, unspecified: Secondary | ICD-10-CM | POA: Diagnosis not present

## 2022-04-24 DIAGNOSIS — B351 Tinea unguium: Secondary | ICD-10-CM | POA: Diagnosis not present

## 2022-05-25 ENCOUNTER — Inpatient Hospital Stay (HOSPITAL_COMMUNITY)
Admission: EM | Admit: 2022-05-25 | Discharge: 2022-05-28 | DRG: 438 | Disposition: A | Discharge status: Home Health | Payer: Medicare Other | Attending: Internal Medicine | Admitting: Internal Medicine

## 2022-05-25 ENCOUNTER — Emergency Department (HOSPITAL_COMMUNITY): Payer: Medicare Other

## 2022-05-25 ENCOUNTER — Encounter (HOSPITAL_COMMUNITY): Payer: Self-pay | Admitting: *Deleted

## 2022-05-25 ENCOUNTER — Other Ambulatory Visit: Payer: Self-pay

## 2022-05-25 DIAGNOSIS — N179 Acute kidney failure, unspecified: Secondary | ICD-10-CM | POA: Diagnosis present

## 2022-05-25 DIAGNOSIS — K5730 Diverticulosis of large intestine without perforation or abscess without bleeding: Secondary | ICD-10-CM | POA: Diagnosis not present

## 2022-05-25 DIAGNOSIS — R9431 Abnormal electrocardiogram [ECG] [EKG]: Secondary | ICD-10-CM | POA: Diagnosis not present

## 2022-05-25 DIAGNOSIS — N3000 Acute cystitis without hematuria: Secondary | ICD-10-CM | POA: Diagnosis not present

## 2022-05-25 DIAGNOSIS — I442 Atrioventricular block, complete: Secondary | ICD-10-CM | POA: Diagnosis present

## 2022-05-25 DIAGNOSIS — Z950 Presence of cardiac pacemaker: Secondary | ICD-10-CM

## 2022-05-25 DIAGNOSIS — E039 Hypothyroidism, unspecified: Secondary | ICD-10-CM | POA: Diagnosis not present

## 2022-05-25 DIAGNOSIS — N132 Hydronephrosis with renal and ureteral calculous obstruction: Secondary | ICD-10-CM | POA: Diagnosis not present

## 2022-05-25 DIAGNOSIS — Z801 Family history of malignant neoplasm of trachea, bronchus and lung: Secondary | ICD-10-CM

## 2022-05-25 DIAGNOSIS — Z79890 Hormone replacement therapy (postmenopausal): Secondary | ICD-10-CM

## 2022-05-25 DIAGNOSIS — Z87891 Personal history of nicotine dependence: Secondary | ICD-10-CM

## 2022-05-25 DIAGNOSIS — N400 Enlarged prostate without lower urinary tract symptoms: Secondary | ICD-10-CM | POA: Diagnosis not present

## 2022-05-25 DIAGNOSIS — K859 Acute pancreatitis without necrosis or infection, unspecified: Principal | ICD-10-CM | POA: Diagnosis present

## 2022-05-25 DIAGNOSIS — N136 Pyonephrosis: Secondary | ICD-10-CM | POA: Diagnosis present

## 2022-05-25 DIAGNOSIS — E7800 Pure hypercholesterolemia, unspecified: Secondary | ICD-10-CM | POA: Diagnosis present

## 2022-05-25 DIAGNOSIS — Z803 Family history of malignant neoplasm of breast: Secondary | ICD-10-CM | POA: Diagnosis not present

## 2022-05-25 DIAGNOSIS — B952 Enterococcus as the cause of diseases classified elsewhere: Secondary | ICD-10-CM | POA: Diagnosis present

## 2022-05-25 DIAGNOSIS — K8500 Idiopathic acute pancreatitis without necrosis or infection: Secondary | ICD-10-CM | POA: Diagnosis not present

## 2022-05-25 DIAGNOSIS — R42 Dizziness and giddiness: Secondary | ICD-10-CM | POA: Diagnosis not present

## 2022-05-25 DIAGNOSIS — Z6826 Body mass index (BMI) 26.0-26.9, adult: Secondary | ICD-10-CM | POA: Diagnosis not present

## 2022-05-25 DIAGNOSIS — Z9049 Acquired absence of other specified parts of digestive tract: Secondary | ICD-10-CM | POA: Diagnosis not present

## 2022-05-25 DIAGNOSIS — K858 Other acute pancreatitis without necrosis or infection: Secondary | ICD-10-CM | POA: Diagnosis not present

## 2022-05-25 DIAGNOSIS — K851 Biliary acute pancreatitis without necrosis or infection: Secondary | ICD-10-CM | POA: Diagnosis not present

## 2022-05-25 DIAGNOSIS — J189 Pneumonia, unspecified organism: Secondary | ICD-10-CM | POA: Diagnosis present

## 2022-05-25 DIAGNOSIS — N281 Cyst of kidney, acquired: Secondary | ICD-10-CM | POA: Diagnosis not present

## 2022-05-25 DIAGNOSIS — N390 Urinary tract infection, site not specified: Secondary | ICD-10-CM | POA: Diagnosis not present

## 2022-05-25 DIAGNOSIS — K5900 Constipation, unspecified: Secondary | ICD-10-CM | POA: Diagnosis present

## 2022-05-25 DIAGNOSIS — Z79899 Other long term (current) drug therapy: Secondary | ICD-10-CM | POA: Diagnosis not present

## 2022-05-25 LAB — BASIC METABOLIC PANEL
Anion gap: 10 (ref 5–15)
BUN: 25 mg/dL — ABNORMAL HIGH (ref 8–23)
CO2: 27 mmol/L (ref 22–32)
Calcium: 9.5 mg/dL (ref 8.9–10.3)
Chloride: 102 mmol/L (ref 98–111)
Creatinine, Ser: 1.4 mg/dL — ABNORMAL HIGH (ref 0.61–1.24)
GFR, Estimated: 46 mL/min — ABNORMAL LOW (ref >60)
Glucose, Bld: 194 mg/dL — ABNORMAL HIGH (ref 70–99)
Potassium: 4.1 mmol/L (ref 3.5–5.1)
Sodium: 139 mmol/L (ref 135–145)

## 2022-05-25 LAB — CBC WITH DIFFERENTIAL/PLATELET
Abs Immature Granulocytes: 0.05 10*3/uL (ref 0.00–0.07)
Basophils Absolute: 0 10*3/uL (ref 0.0–0.1)
Basophils Relative: 0 %
Eosinophils Absolute: 0 10*3/uL (ref 0.0–0.5)
Eosinophils Relative: 0 %
HCT: 49.8 % (ref 39.0–52.0)
Hemoglobin: 17.1 g/dL — ABNORMAL HIGH (ref 13.0–17.0)
Immature Granulocytes: 0 %
Lymphocytes Relative: 4 %
Lymphs Abs: 0.6 10*3/uL — ABNORMAL LOW (ref 0.7–4.0)
MCH: 34.5 pg — ABNORMAL HIGH (ref 26.0–34.0)
MCHC: 34.3 g/dL (ref 30.0–36.0)
MCV: 100.4 fL — ABNORMAL HIGH (ref 80.0–100.0)
Monocytes Absolute: 0.6 10*3/uL (ref 0.1–1.0)
Monocytes Relative: 4 %
Neutro Abs: 11.9 10*3/uL — ABNORMAL HIGH (ref 1.7–7.7)
Neutrophils Relative %: 92 %
Platelets: 190 10*3/uL (ref 150–400)
RBC: 4.96 MIL/uL (ref 4.22–5.81)
RDW: 12.1 % (ref 11.5–15.5)
WBC: 13.1 10*3/uL — ABNORMAL HIGH (ref 4.0–10.5)
nRBC: 0 % (ref 0.0–0.2)

## 2022-05-25 LAB — URINALYSIS, ROUTINE W REFLEX MICROSCOPIC
Bilirubin Urine: NEGATIVE
Glucose, UA: NEGATIVE mg/dL
Ketones, ur: NEGATIVE mg/dL
Nitrite: NEGATIVE
Protein, ur: 30 mg/dL — AB
Specific Gravity, Urine: 1.011 (ref 1.005–1.030)
WBC, UA: >50 WBC/hpf — ABNORMAL HIGH (ref 0–5)
pH: 5 (ref 5.0–8.0)

## 2022-05-25 LAB — LIPASE, BLOOD: Lipase: 6125 U/L — ABNORMAL HIGH (ref 11–51)

## 2022-05-25 LAB — MAGNESIUM: Magnesium: 1.9 mg/dL (ref 1.7–2.4)

## 2022-05-25 MED ORDER — PROMETHAZINE HCL 25 MG/ML IJ SOLN
INTRAMUSCULAR | Status: AC
  Filled 2022-05-25: qty 1

## 2022-05-25 MED ORDER — SODIUM CHLORIDE 0.9 % IV SOLN
100.0000 mg | Freq: Two times a day (BID) | INTRAVENOUS | Status: DC
  Administered 2022-05-25 – 2022-05-26 (×3): 100 mg via INTRAVENOUS
  Filled 2022-05-25 (×7): qty 100

## 2022-05-25 MED ORDER — SODIUM CHLORIDE 0.9 % IV BOLUS
1000.0000 mL | Freq: Once | INTRAVENOUS | Status: AC
  Administered 2022-05-25: 1000 mL via INTRAVENOUS

## 2022-05-25 MED ORDER — LACTATED RINGERS IV SOLN: INTRAVENOUS | Status: DC

## 2022-05-25 MED ORDER — BACID PO TABS
2.0000 | ORAL_TABLET | Freq: Three times a day (TID) | ORAL | Status: DC
  Filled 2022-05-25 (×13): qty 2

## 2022-05-25 MED ORDER — ACETAMINOPHEN 325 MG PO TABS: 650.0000 mg | ORAL_TABLET | Freq: Four times a day (QID) | ORAL | Status: DC | PRN

## 2022-05-25 MED ORDER — LEVOTHYROXINE SODIUM 50 MCG PO TABS
50.0000 ug | ORAL_TABLET | Freq: Every day | ORAL | Status: DC
  Administered 2022-05-26 – 2022-05-28 (×3): 50 ug via ORAL
  Filled 2022-05-25 (×3): qty 1

## 2022-05-25 MED ORDER — ENOXAPARIN SODIUM 40 MG/0.4ML IJ SOSY: 40.0000 mg | PREFILLED_SYRINGE | Freq: Every day | INTRAMUSCULAR | Status: DC

## 2022-05-25 MED ORDER — SODIUM CHLORIDE 0.9 % IV SOLN
12.5000 mg | Freq: Once | INTRAVENOUS | Status: AC
  Administered 2022-05-25: 12.5 mg via INTRAVENOUS
  Filled 2022-05-25: qty 0.5

## 2022-05-25 MED ORDER — ENOXAPARIN SODIUM 30 MG/0.3ML IJ SOSY
30.0000 mg | PREFILLED_SYRINGE | Freq: Every day | INTRAMUSCULAR | Status: DC
  Administered 2022-05-25: 30 mg via SUBCUTANEOUS
  Filled 2022-05-25: qty 0.3

## 2022-05-25 MED ORDER — SODIUM CHLORIDE 0.9 % IV SOLN
2.0000 g | INTRAVENOUS | Status: DC
  Administered 2022-05-25 – 2022-05-26 (×2): 2 g via INTRAVENOUS
  Filled 2022-05-25 (×2): qty 20

## 2022-05-25 MED ORDER — PROCHLORPERAZINE EDISYLATE 10 MG/2ML IJ SOLN
10.0000 mg | Freq: Four times a day (QID) | INTRAMUSCULAR | Status: DC | PRN
  Administered 2022-05-27: 10 mg via INTRAVENOUS
  Filled 2022-05-25: qty 2

## 2022-05-25 MED ORDER — POLYETHYLENE GLYCOL 3350 17 G PO PACK: 17.0000 g | PACK | Freq: Every day | ORAL | Status: DC | PRN

## 2022-05-25 MED ORDER — ACETAMINOPHEN 650 MG RE SUPP: 650.0000 mg | Freq: Four times a day (QID) | RECTAL | Status: DC | PRN

## 2022-05-25 MED ORDER — MORPHINE SULFATE (PF) 2 MG/ML IV SOLN: 2.0000 mg | INTRAVENOUS | Status: DC | PRN

## 2022-05-25 MED ORDER — ONDANSETRON HCL 4 MG/2ML IJ SOLN
4.0000 mg | Freq: Once | INTRAMUSCULAR | Status: AC
Start: 2022-05-25 — End: 2022-05-25
  Administered 2022-05-25: 4 mg via INTRAVENOUS
  Filled 2022-05-25: qty 2

## 2022-05-25 NOTE — ED Provider Notes (Signed)
[facility] EMERGENCY DEPARTMENT Provider Note   CSN: [id number] Arrival date & time: 05/25/22  1611     History Chief Complaint  Patient presents with   Back Pain    Chase Chase is a 88 y.o. male patient with history of frequent UTIs, prostate disease, hyperlipidemia who presents to the emergency department today for further evaluation of generalized myalgias, lower abdominal pain.  Patient was recently diagnosed with a UTI and given a shot of Rocephin and placed on oral antibiotics from his PCP this morning.  Patient then began having associated nausea and vomiting and lower abdominal pain.  He denies fever, chills, dysuria, hematuria.  Patient is having some urinary frequency.  No chest pain or shortness of breath.  Family at bedside states that patient does have a history of kidney stones as well.   Back Pain      Home Medications Prior to Admission medications   Medication Sig Start Date End Date Taking? Authorizing Provider  latanoprost (XALATAN) 0.005 % ophthalmic solution Place 1 drop into both eyes at bedtime as needed.  08/01/19  Yes [provider]  levothyroxine (SYNTHROID) 50 MCG tablet Take 50 mcg by mouth daily. 12/19/19  Yes [provider]  meclizine (ANTIVERT) 25 MG tablet Take 25 mg by mouth as needed for dizziness (rarely needs).   Yes [provider]  Misc Natural Products (PROSTATE HEALTH PO) Take 2 tablets by mouth daily.   Yes [provider]  naproxen sodium (ALEVE) 220 MG tablet Take 220 mg by mouth daily as needed.   Yes [provider]  Omega-3 Fatty Acids (FISH OIL) 645 MG CAPS Take 1,290 mg by mouth daily. Take 2 capsules daily for cholesterol.   Yes [provider]  Vitamin D, Ergocalciferol, (DRISDOL) 1.25 MG (50000 UT) CAPS capsule Take 1 capsule by mouth every 30 (thirty) days. 08/23/19  Yes [provider]  zinc gluconate 50 MG tablet Take 50 mg by mouth daily.   Yes [provider]  alfuzosin (UROXATRAL) 10 MG 24 hr tablet Take 1 tablet (10 mg total) by mouth at bedtime. Patient not taking: Reported on 05/25/2022 03/15/22   Cleon Gustin, MD  amoxicillin-clavulanate (AUGMENTIN) 875-125 MG tablet Take 1 tablet by mouth every 12 (twelve) hours. Patient not taking: Reported on 05/25/2022 03/08/22   [place], Berneice Heinrich, PA-C  ciprofloxacin (CIPRO) 500 MG tablet Take 500 mg by mouth 2 (two) times daily. 05/25/22   [provider]      Allergies    Patient has no known allergies.    Review of Systems   Review of Systems  Musculoskeletal:  Positive for back pain.  All other systems reviewed and are negative.   Physical Exam Updated Vital Signs BP (!) 147/72   Pulse 60   Temp (!) 97.3 F (36.3 C)   Resp 20   Ht '5\' 7"'$  (1.702 m)   Wt 74 kg   SpO2 97%   BMI 25.55 kg/m  Physical Exam Vitals and nursing note reviewed.  Constitutional:      General: He is not in acute distress.    Appearance: Normal appearance.     Comments: Elderly  HENT:     Head: Normocephalic and atraumatic.  Eyes:     General:        Right eye: No discharge.        Left eye: No discharge.  Cardiovascular:     Comments: Regular rate and rhythm.  S1/S2 are  distinct without any evidence of murmur, rubs, or gallops.  Radial pulses are 2+ bilaterally.  Dorsalis pedis pulses are 2+ bilaterally.  No evidence of pedal edema. Pulmonary:     Comments: Clear to auscultation bilaterally.  Normal effort.  No respiratory distress.  No evidence of wheezes, rales, or rhonchi heard throughout. Abdominal:     General: Abdomen is flat. Bowel sounds are normal. There is no distension.     Tenderness: There is no abdominal tenderness. There is no guarding or rebound.  Musculoskeletal:        General: Normal range of motion.     Cervical back: Neck supple.  Skin:    General: Skin is warm and dry.     Findings: No rash.  Neurological:     General: No focal deficit present.     Mental  Status: He is alert.  Psychiatric:        Mood and Affect: Mood normal.        Behavior: Behavior normal.     ED Results / Procedures / Treatments   Labs (all labs ordered are listed, but only abnormal results are displayed) Labs Reviewed  CBC WITH DIFFERENTIAL/PLATELET - Abnormal; Notable for the following components:      Result Value   WBC 13.1 (*)    Hemoglobin 17.1 (*)    MCV 100.4 (*)    MCH 34.5 (*)    Neutro Abs 11.9 (*)    Lymphs Abs 0.6 (*)    All other components within normal limits  BASIC METABOLIC PANEL - Abnormal; Notable for the following components:   Glucose, Bld 194 (*)    BUN 25 (*)    Creatinine, Ser 1.40 (*)    GFR, Estimated 46 (*)    All other components within normal limits  URINALYSIS, ROUTINE W REFLEX MICROSCOPIC - Abnormal; Notable for the following components:   Color, Urine AMBER (*)    APPearance HAZY (*)    Hgb urine dipstick MODERATE (*)    Protein, ur 30 (*)    Leukocytes,Ua LARGE (*)    WBC, UA >50 (*)    Bacteria, UA RARE (*)    All other components within normal limits  LIPASE, BLOOD - Abnormal; Notable for the following components:   Lipase 6,125 (*)    All other components within normal limits  LIPID PANEL - Abnormal; Notable for the following components:   Cholesterol 207 (*)    LDL Cholesterol 140 (*)    All other components within normal limits  BASIC METABOLIC PANEL - Abnormal; Notable for the following components:   Glucose, Bld 125 (*)    BUN 26 (*)    All other components within normal limits  CBC - Abnormal; Notable for the following components:   WBC 19.4 (*)    MCV 102.7 (*)    MCH 34.2 (*)    All other components within normal limits  HEPATIC FUNCTION PANEL - Abnormal; Notable for the following components:   Total Protein 5.9 (*)    Albumin 3.3 (*)    All other components within normal limits  URINE CULTURE  MAGNESIUM    EKG None  Radiology CT Renal Stone Study  Result Date: 05/25/2022 CLINICAL DATA:   Abdominal and back pain. CT abdomen and pelvis 04/03/2022. EXAM: CT ABDOMEN AND PELVIS WITHOUT CONTRAST TECHNIQUE: Multidetector CT imaging of the abdomen and pelvis was performed following the standard protocol without IV contrast. RADIATION DOSE REDUCTION: This exam was performed according to the  departmental dose-optimization program which includes automated exposure control, adjustment of the mA and/or kV according to patient size and/or use of iterative reconstruction technique. COMPARISON:  CT abdomen and pelvis 04/03/2022. FINDINGS: Lower chest: There is a stable small left pleural effusion with compressive atelectasis of the left lower lobe. There is some new patchy airspace disease in the right lower lobe. Para-aortic soft tissue density in the lower chest is unchanged measuring 4.4 x 5.1 cm. Hepatobiliary: No focal liver abnormality is seen. Status post cholecystectomy. No biliary dilatation. Pancreas: There is mild inflammatory stranding surrounding the pancreas. No ductal dilatation or fluid collection identified. Spleen: Normal in size without focal abnormality. Adrenals/Urinary Tract: The bladder is decompressed and not well evaluated. Small left bladder diverticulum again seen. There is stable mild left-sided hydronephrosis. Again seen are 2 calculi within the left renal pelvis measuring 1 cm and 9 mm, unchanged. There are additional bilateral nonobstructing renal calculi also unchanged. Bilateral renal cysts are similar to the prior study. Hyperdense cyst in the right mid kidney appears unchanged. Hyperdense cyst in the inferior pole the right kidney appears unchanged. Adrenal glands are within normal limits. Stomach/Bowel: Stomach is within normal limits. Appendix appears normal. No evidence of bowel wall thickening, distention, or inflammatory changes. There is diffuse colonic diverticulosis. Vascular/Lymphatic: Aortic atherosclerosis. No enlarged abdominal or pelvic lymph nodes. Reproductive:  Prostate gland is enlarged, unchanged. Other: Fat containing right inguinal hernia is again seen. There is no ascites. Musculoskeletal: Degenerative changes affect the spine. IMPRESSION: 1. Findings compatible with acute uncomplicated pancreatitis. 2. Stable mild left-sided hydronephrosis secondary to 2 calculi within the left renal pelvis measuring up to 1 cm. 3. Unchanged lower thoracic para-aortic soft tissue mass, indeterminate. This can be further evaluated with chest CT. 4. Minimal new patchy airspace disease in the right lower lobe. Stable small left pleural effusion. 5. Stable right renal calculi without hydronephrosis. 6. Colonic diverticulosis. 7. Prostatomegaly. 8.  Aortic Atherosclerosis (ICD10-I70.0). Electronically Signed   By: Ronney Asters M.D.   On: 05/25/2022 17:12    Procedures Procedures    Medications Ordered in ED Medications  doxycycline (VIBRAMYCIN) 100 mg in sodium chloride 0.9 % 250 mL IVPB (100 mg Intravenous New Bag/Given 05/26/22 1103)  cefTRIAXone (ROCEPHIN) 2 g in sodium chloride 0.9 % 100 mL IVPB (2 g Intravenous New Bag/Given 05/25/22 2208)  levothyroxine (SYNTHROID) tablet 50 mcg (50 mcg Oral Given 05/26/22 0527)  lactated ringers infusion ( Intravenous Rate/Dose Change 05/26/22 1056)  acetaminophen (TYLENOL) tablet 650 mg (has no administration in time range)    Or  acetaminophen (TYLENOL) suppository 650 mg (has no administration in time range)  morphine (PF) 2 MG/ML injection 2 mg (has no administration in time range)  prochlorperazine (COMPAZINE) injection 10 mg (has no administration in time range)  enoxaparin (LOVENOX) injection 40 mg (has no administration in time range)  polyethylene glycol (MIRALAX / GLYCOLAX) packet 17 g (has no administration in time range)  acidophilus (RISAQUAD) capsule 2 capsule (2 capsules Oral Given 05/26/22 1557)  ondansetron (ZOFRAN) injection 4 mg (4 mg Intravenous Given 05/25/22 1818)  sodium chloride 0.9 % bolus 1,000 mL (1,000  mLs Intravenous Bolus 05/25/22 1854)  promethazine (PHENERGAN) 12.5 mg in sodium chloride 0.9 % 50 mL IVPB (0 mg Intravenous Stopped 05/26/22 1056)  sodium chloride 0.9 % bolus 1,000 mL (1,000 mLs Intravenous New Bag/Given 05/26/22 0744)    ED Course/ Medical Decision Making/ A&P Clinical Course as of 05/26/22 1657  Thu May 25, 2022  1833 This is  a 88 year old male who presented from home with complaint of poor appetite and abdominal pain, was diagnosed with a UTI and treated with 1 g intramuscular Rocephin at the PCPs office, sent to the ED for further evaluation.  Here he is afebrile, normal heart rate, generally well-appearing but does have some abdominal suprapubic and epigastric discomfort.  White blood cell count was 13.1.  UA continues to be suggestive of infection.  CT study of the abdomen demonstrated signs of mild acute uncomplicated pancreatitis.  He does not drink alcohol.  Plan for medical admission.  He will be started on IV fluids.  Lipase will be added on.  He has already received 1 g antibiotics as outpatient for UTI in the office [MT]  1843 CBC with Differential(!) Evidence of significant leukocytosis.  No signs of anemia. [CF]  8182 Urinalysis, Routine w reflex microscopic Urine, Clean Catch(!) There is evidence of UTI.  I sent that off for culture. [CF]  1845 Lipase, blood Lipase is pending. [CF]  1846 CT Renal Stone Study Personally ordered and reviewed CT renal stone study to look for evidence of pyelonephritis or kidney stone.  There was evidence of uncomplicated pancreatitis.  I do agree with the radiologist interpretation. [CF]  9937 Basic metabolic panel(!) No electrolyte abnormalities.  There is evidence of elevated creatinine today.  [CF]  [id number] Urine Culture Urine culture is obtained and pending.  [CF]  1907 Admitted to hospitalist [MT]  1908 I discussed CT findings of patchy airspace disease with the hospitalist will evaluate the patient and decide on further  antibiotics. [MT]    Clinical Course User Index [CF] Hendricks Limes, PA-C [MT] Langston Masker Carola Rhine, MD                           Medical Decision Making Chase Chase is a 88 y.o. male who presents to the emergency department with abdominal pain, urinary frequency, and new diagnosis of UTI.  Patient has been having some nausea and vomiting since being seen by his PCP earlier this morning.  We will get some basic labs and scan his abdomen to look for signs of pyelonephritis, kidney stone or other intra-abdominal pathologies.   Amount and/or Complexity of Data Reviewed Labs: ordered. Decision-making details documented in ED Course. Radiology: ordered and independent interpretation performed. Decision-making details documented in ED Course.  Risk Prescription drug management. Parenteral controlled substances. Decision regarding hospitalization. Risk Details: Patient still having some abdominal pain and a lot of dry heaving and nausea.  Given the clinical scenario, I do believe that the patient would likely benefit from further evaluation given his age, comorbidities, and findings of UTI and pancreatitis today.  I will work on getting him admitted to the hospital. Patient had rocephin given at his PCP and will defer antibiotics for now.    Final Clinical Impression(s) / ED Diagnoses Final diagnoses:  Acute pancreatitis, unspecified complication status, unspecified pancreatitis type  Acute cystitis without hematuria    Rx / DC Orders ED Discharge Orders     None         Hendricks Limes, [place] 05/26/22 1657    Wyvonnia Dusky, MD 05/29/22 1359

## 2022-05-25 NOTE — Assessment & Plan Note (Addendum)
Reports cough.  No dyspnea.  O2 sats 96 to 100% on room air.  CT showing minimal new patchy airspace disease in the right lower lobe.  Patient vomited today, ?  Aspiration. -IV ceftriaxone and doxycycline (QTc 531) -Probiotics

## 2022-05-25 NOTE — Assessment & Plan Note (Signed)
Presenting with abdominal pain or vomiting.  CT shows complicated acute pancreatitis.  Denies alcohol use.  Has had cholecystectomy.  -Bowel rest, n.p.o. for now -Lipid panel in a.m. -IV morphine 2 mg every 4 hourly as needed -1 L bolus given continue LR 75cc/hr  - ?  Prolonged QTc, IV Phenergan as needed

## 2022-05-25 NOTE — H&P (Signed)
History and Physical    JAYTON POPELKA [id number]:[id number] DOB: 1922/02/14 DOA: 05/25/2022  PCP: Sharilyn Sites, MD   Patient coming from: Home  I have personally briefly reviewed patient's old medical records in [place]  Chief Complaint: Abdominal pain, back pain, vomiting  HPI: Chase Chase is a 88 y.o. male with medical history significant for complete heart block, pacemaker, hard of hearing. Patient presented to the ED with complaints of abdominal pain back pain and vomiting.  Patient was seen by his outpatient provider today was diagnosed with a UTI and given a shot of Rocephin to continue with oral ciprofloxacin .  Patient has been having abdominal pain over the past few days.  When he got home from his PCPs office, the abdominal pain significantly worsened and he started vomiting.  No blood in vomitus.  Reports a mild cough.  No difficulty breathing.  No diarrhea. Spouse is at bedside, she denies memory issues, reports over the past few days he was a little bit confused, he felt this was due to the pain patient was experiencing, he is not confused today.  He is hard of hearing, but he is able to answer questions appropriately.  Spouse reports patient has had frequent UTIs over the past year.  She is also worried about effects of the antibiotics patient has been getting and will be getting in the hospital and has concerns for C. difficile infection.  ED Course: Tmax 98.6.  Heart rate 60s to 70s.  Blood pressure systolic 093O to 671I.  WBC 13.1.  UA with large leukocytes.  CT renal stone study-findings of acute uncomplicated pancreatitis.  Also showed stable mild left sided hydronephrosis, and suggestion of minimal new patchy airspace disease in right lower lobe. 1 L bolus given hospitalist to admit.  Review of Systems: As per HPI all other systems reviewed and negative.  Past Medical History:  Diagnosis Date   At risk of UTI    CHB (complete heart block) ([place]) 09/2019   CHB  (complete heart block) ([place]) 09/2019   Glaucoma    Glaucoma    HOH (hard of hearing)    Hypercholesterolemia    Pacemaker 09/19/2019   Biotronik dual chamber PPM   Prostate disease    unspecified   Vertigo     Past Surgical History:  Procedure Laterality Date   CHOLECYSTECTOMY     HERNIA REPAIR     bilateral   PACEMAKER IMPLANT N/A 09/19/2019   Procedure: PACEMAKER IMPLANT;  Surgeon: Evans Lance, MD;  Location: [facility];  Service: Cardiovascular;  Laterality: N/A;     reports that he has quit smoking. His smoking use included cigarettes. He has a 16.00 pack-year smoking history. He quit smokeless tobacco use about 10 years ago.  His smokeless tobacco use included chew. He reports that he does not drink alcohol and does not use drugs.  No Known Allergies  Family History  Problem Relation Age of Onset   Breast cancer Sister    Lung cancer Brother    Lung cancer Brother    Lung cancer Brother    Breast cancer Sister    Breast cancer Sister      Prior to Admission medications   Medication Sig Start Date End Date Taking? Authorizing Provider  latanoprost (XALATAN) 0.005 % ophthalmic solution Place 1 drop into both eyes at bedtime as needed.  08/01/19  Yes [provider]  levothyroxine (SYNTHROID) 50 MCG tablet Take 50 mcg by mouth  daily. 12/19/19  Yes [provider]  meclizine (ANTIVERT) 25 MG tablet Take 25 mg by mouth as needed for dizziness (rarely needs).   Yes [provider]  Misc Natural Products (PROSTATE HEALTH PO) Take 2 tablets by mouth daily.   Yes [provider]  naproxen sodium (ALEVE) 220 MG tablet Take 220 mg by mouth daily as needed.   Yes [provider]  Omega-3 Fatty Acids (FISH OIL) 645 MG CAPS Take 1,290 mg by mouth daily. Take 2 capsules daily for cholesterol.   Yes [provider]  Vitamin D, Ergocalciferol, (DRISDOL) 1.25 MG (50000 UT) CAPS capsule Take 1 capsule by mouth every 30  (thirty) days. 08/23/19  Yes [provider]  zinc gluconate 50 MG tablet Take 50 mg by mouth daily.   Yes [provider]  alfuzosin (UROXATRAL) 10 MG 24 hr tablet Take 1 tablet (10 mg total) by mouth at bedtime. Patient not taking: Reported on 05/25/2022 03/15/22   Cleon Gustin, MD  amoxicillin-clavulanate (AUGMENTIN) 875-125 MG tablet Take 1 tablet by mouth every 12 (twelve) hours. Patient not taking: Reported on 05/25/2022 03/08/22   Summerlin, Berneice Heinrich, PA-C  ciprofloxacin (CIPRO) 500 MG tablet Take 500 mg by mouth 2 (two) times daily. 05/25/22   [provider]    Physical Exam: Vitals:   05/25/22 1830 05/25/22 2000 05/25/22 2015 05/25/22 2048  BP: (!) 148/71 129/73  (!) 144/70  Pulse: 66 64 83 73  Resp: (!) '24 19 15 17  '$ Temp:    98.6 F (37 C)  TempSrc:      SpO2: 96% 95% 95% 100%  Weight:    74 kg  Height:    '5\' 7"'$  (1.702 m)    Constitutional: NAD, calm, comfortable, hard of hearing Vitals:   05/25/22 1830 05/25/22 2000 05/25/22 2015 05/25/22 2048  BP: (!) 148/71 129/73  (!) 144/70  Pulse: 66 64 83 73  Resp: (!) '24 19 15 17  '$ Temp:    98.6 F (37 C)  TempSrc:      SpO2: 96% 95% 95% 100%  Weight:    74 kg  Height:    '5\' 7"'$  (1.702 m)   Eyes: PERRL, lids and conjunctivae normal ENMT: Mucous membranes are moist.   Neck: normal, supple, no masses, no thyromegaly Respiratory: clear to auscultation bilaterally, no wheezing, no crackles. Normal respiratory effort. No accessory muscle use.  Cardiovascular: Regular rate and rhythm, no murmurs / rubs / gallops. No extremity edema.  Abdomen: no tenderness, no masses palpated. No hepatosplenomegaly. Bowel sounds positive.  Musculoskeletal: no clubbing / cyanosis. No joint deformity upper and lower extremities.  Skin: no rashes, lesions, ulcers. No induration Neurologic: No apparent cranial nerve abnormality moving extremities spontaneously Psychiatric: Normal judgment and insight. Alert and  oriented x 3. Normal mood.   Labs on Admission: I have personally reviewed following labs and imaging studies  CBC: Recent Labs  Lab 05/25/22 1632  WBC 13.1*  NEUTROABS 11.9*  HGB 17.1*  HCT 49.8  MCV 100.4*  PLT 681   Basic Metabolic Panel: Recent Labs  Lab 05/25/22 1632  NA 139  K 4.1  CL 102  CO2 27  GLUCOSE 194*  BUN 25*  CREATININE 1.40*  CALCIUM 9.5    Recent Labs  Lab 05/25/22 1632  LIPASE 6,125*   Urine analysis:    Component Value Date/Time   COLORURINE AMBER (A) 05/25/2022 1649   APPEARANCEUR HAZY (A) 05/25/2022 1649   APPEARANCEUR Cloudy (A) 04/17/2022  1202   LABSPEC 1.011 05/25/2022 1649   PHURINE 5.0 05/25/2022 1649   GLUCOSEU NEGATIVE 05/25/2022 1649   HGBUR MODERATE (A) 05/25/2022 1649   BILIRUBINUR NEGATIVE 05/25/2022 1649   BILIRUBINUR Negative 04/17/2022 Joice 05/25/2022 1649   PROTEINUR 30 (A) 05/25/2022 1649   UROBILINOGEN 0.2 04/08/2013 1815   NITRITE NEGATIVE 05/25/2022 1649   LEUKOCYTESUR LARGE (A) 05/25/2022 1649    Radiological Exams on Admission: CT Renal Stone Study  Result Date: 05/25/2022 CLINICAL DATA:  Abdominal and back pain. CT abdomen and pelvis 04/03/2022. EXAM: CT ABDOMEN AND PELVIS WITHOUT CONTRAST TECHNIQUE: Multidetector CT imaging of the abdomen and pelvis was performed following the standard protocol without IV contrast. RADIATION DOSE REDUCTION: This exam was performed according to the departmental dose-optimization program which includes automated exposure control, adjustment of the mA and/or kV according to patient size and/or use of iterative reconstruction technique. COMPARISON:  CT abdomen and pelvis 04/03/2022. FINDINGS: Lower chest: There is a stable small left pleural effusion with compressive atelectasis of the left lower lobe. There is some new patchy airspace disease in the right lower lobe. Para-aortic soft tissue density in the lower chest is unchanged measuring 4.4 x 5.1 cm.  Hepatobiliary: No focal liver abnormality is seen. Status post cholecystectomy. No biliary dilatation. Pancreas: There is mild inflammatory stranding surrounding the pancreas. No ductal dilatation or fluid collection identified. Spleen: Normal in size without focal abnormality. Adrenals/Urinary Tract: The bladder is decompressed and not well evaluated. Small left bladder diverticulum again seen. There is stable mild left-sided hydronephrosis. Again seen are 2 calculi within the left renal pelvis measuring 1 cm and 9 mm, unchanged. There are additional bilateral nonobstructing renal calculi also unchanged. Bilateral renal cysts are similar to the prior study. Hyperdense cyst in the right mid kidney appears unchanged. Hyperdense cyst in the inferior pole the right kidney appears unchanged. Adrenal glands are within normal limits. Stomach/Bowel: Stomach is within normal limits. Appendix appears normal. No evidence of bowel wall thickening, distention, or inflammatory changes. There is diffuse colonic diverticulosis. Vascular/Lymphatic: Aortic atherosclerosis. No enlarged abdominal or pelvic lymph nodes. Reproductive: Prostate gland is enlarged, unchanged. Other: Fat containing right inguinal hernia is again seen. There is no ascites. Musculoskeletal: Degenerative changes affect the spine. IMPRESSION: 1. Findings compatible with acute uncomplicated pancreatitis. 2. Stable mild left-sided hydronephrosis secondary to 2 calculi within the left renal pelvis measuring up to 1 cm. 3. Unchanged lower thoracic para-aortic soft tissue mass, indeterminate. This can be further evaluated with chest CT. 4. Minimal new patchy airspace disease in the right lower lobe. Stable small left pleural effusion. 5. Stable right renal calculi without hydronephrosis. 6. Colonic diverticulosis. 7. Prostatomegaly. 8.  Aortic Atherosclerosis (ICD10-I70.0). Electronically Signed   By: Ronney Asters M.D.   On: 05/25/2022 17:12    EKG: Independently  reviewed.  EKG normal sinus rhythm rate RBBB which is old.  But I see very subtle ventricular pacer spikes.  Assessment/Plan Principal Problem:   Acute pancreatitis Active Problems:   Lower urinary tract infectious disease   Pneumonia   CHB (complete heart block) (HCC)   Pacemaker   Assessment and Plan: * Acute pancreatitis Presenting with abdominal pain or vomiting.  CT shows complicated acute pancreatitis.  Denies alcohol use.  Has had cholecystectomy.  -Bowel rest, n.p.o. for now -Lipid panel in a.m. -IV morphine 2 mg every 4 hourly as needed -1 L bolus given continue LR 75cc/hr  - ?  Prolonged QTc, IV Phenergan as needed  Pneumonia Reports cough.  No dyspnea.  O2 sats 96 to 100% on room air.  CT showing minimal new patchy airspace disease in the right lower lobe.  Patient vomited today, ?  Aspiration. -IV ceftriaxone and doxycycline (QTc 531) -Probiotics  Lower urinary tract infectious disease UA with large leukocytes.  Leukocytosis of 13.1, but rules out for sepsis.  CT showing stable mild left hydronephrosis secondary to 2 calculi within the left renal pelvis measuring up to 1 cm.   -CT also shows indeterminate para-aortic soft tissue mass, can further be evaluated with chest CT, will defer for now or to outpatient setting -Follow-up urine cultures -IV Rocephin -Probiotics    DVT prophylaxis: Lovenox Code Status: Full Family Communication:  Spouse at bedside Disposition Plan: ~ /> 2 days Consults called: None Admission status:  Inpt tele I certify that at the point of admission it is my clinical judgment that the patient will require inpatient hospital care spanning beyond 2 midnights from the point of admission due to high intensity of service, high risk for further deterioration and high frequency of surveillance required.   Author: Bethena Roys, MD 05/25/2022 10:09 PM  For on call review [email or web address].

## 2022-05-25 NOTE — Assessment & Plan Note (Addendum)
UA with large leukocytes.  Leukocytosis of 13.1, but rules out for sepsis.  CT showing stable mild left hydronephrosis secondary to 2 calculi within the left renal pelvis measuring up to 1 cm.   -CT also shows indeterminate para-aortic soft tissue mass, can further be evaluated with chest CT, will defer for now or to outpatient setting -Follow-up urine cultures -IV Rocephin -Probiotics

## 2022-05-25 NOTE — ED Triage Notes (Signed)
Pt brought in by [facility] for c/o abdominal pain and back pain; pt was seen at his PCP this am and diagnosed with a UTI and given shot of rocephin with antibiotics for home; family reported to ems pt is still in a lot of pain and is now vomiting

## 2022-05-26 DIAGNOSIS — K859 Acute pancreatitis without necrosis or infection, unspecified: Secondary | ICD-10-CM | POA: Diagnosis not present

## 2022-05-26 LAB — LIPID PANEL
Cholesterol: 207 mg/dL — ABNORMAL HIGH (ref 0–200)
HDL: 49 mg/dL (ref >40)
LDL Cholesterol: 140 mg/dL — ABNORMAL HIGH (ref 0–99)
Total CHOL/HDL Ratio: 4.2 RATIO
Triglycerides: 89 mg/dL (ref <150)
VLDL: 18 mg/dL (ref 0–40)

## 2022-05-26 LAB — HEPATIC FUNCTION PANEL
ALT: 14 U/L (ref 0–44)
AST: 16 U/L (ref 15–41)
Albumin: 3.3 g/dL — ABNORMAL LOW (ref 3.5–5.0)
Alkaline Phosphatase: 41 U/L (ref 38–126)
Bilirubin, Direct: <0.1 mg/dL (ref 0.0–0.2)
Total Bilirubin: 1 mg/dL (ref 0.3–1.2)
Total Protein: 5.9 g/dL — ABNORMAL LOW (ref 6.5–8.1)

## 2022-05-26 LAB — CBC
HCT: 50.2 % (ref 39.0–52.0)
Hemoglobin: 16.7 g/dL (ref 13.0–17.0)
MCH: 34.2 pg — ABNORMAL HIGH (ref 26.0–34.0)
MCHC: 33.3 g/dL (ref 30.0–36.0)
MCV: 102.7 fL — ABNORMAL HIGH (ref 80.0–100.0)
Platelets: 174 10*3/uL (ref 150–400)
RBC: 4.89 MIL/uL (ref 4.22–5.81)
RDW: 12.2 % (ref 11.5–15.5)
WBC: 19.4 10*3/uL — ABNORMAL HIGH (ref 4.0–10.5)
nRBC: 0 % (ref 0.0–0.2)

## 2022-05-26 LAB — BASIC METABOLIC PANEL
Anion gap: 13 (ref 5–15)
BUN: 26 mg/dL — ABNORMAL HIGH (ref 8–23)
CO2: 24 mmol/L (ref 22–32)
Calcium: 9.2 mg/dL (ref 8.9–10.3)
Chloride: 105 mmol/L (ref 98–111)
Creatinine, Ser: 1.08 mg/dL (ref 0.61–1.24)
GFR, Estimated: >60 mL/min (ref >60)
Glucose, Bld: 125 mg/dL — ABNORMAL HIGH (ref 70–99)
Potassium: 4.3 mmol/L (ref 3.5–5.1)
Sodium: 142 mmol/L (ref 135–145)

## 2022-05-26 MED ORDER — POLYETHYLENE GLYCOL 3350 17 G PO PACK
17.0000 g | PACK | Freq: Every evening | ORAL | Status: DC
  Administered 2022-05-26 – 2022-05-27 (×2): 17 g via ORAL
  Filled 2022-05-26 (×2): qty 1

## 2022-05-26 MED ORDER — ENOXAPARIN SODIUM 40 MG/0.4ML IJ SOSY
40.0000 mg | PREFILLED_SYRINGE | Freq: Every day | INTRAMUSCULAR | Status: DC
  Administered 2022-05-26 – 2022-05-27 (×2): 40 mg via SUBCUTANEOUS
  Filled 2022-05-26 (×2): qty 0.4

## 2022-05-26 MED ORDER — RISAQUAD PO CAPS
2.0000 | ORAL_CAPSULE | Freq: Every day | ORAL | Status: DC
  Administered 2022-05-26 – 2022-05-28 (×3): 2 via ORAL
  Filled 2022-05-26 (×3): qty 2

## 2022-05-26 MED ORDER — SODIUM CHLORIDE 0.9 % IV BOLUS
1000.0000 mL | Freq: Once | INTRAVENOUS | Status: AC
  Administered 2022-05-26: 1000 mL via INTRAVENOUS

## 2022-05-26 NOTE — Progress Notes (Signed)
PROGRESS NOTE    Chase Chase  [id number]:[id number] DOB: Jan 20, 1922 DOA: 05/25/2022 PCP: Sharilyn Sites, MD   Brief Narrative:    Chase Chase is a 88 y.o. male with medical history significant for complete heart block, pacemaker, hard of hearing. Patient presented to the ED with complaints of abdominal pain back pain and vomiting.  He has been admitted with acute pancreatitis and is also noted to have pneumonia and UTI and started on IV antibiotics.  Assessment & Plan:   Principal Problem:   Acute pancreatitis Active Problems:   Lower urinary tract infectious disease   Pneumonia   CHB (complete heart block) (HCC)   Pacemaker  Assessment and Plan:   Acute pancreatitis Presenting with abdominal pain or vomiting.  CT shows complicated acute pancreatitis.  Denies alcohol use.  Has had cholecystectomy.  -Bowel rest, n.p.o. for now -Lipid panel unremarkable -Another bolus of IV fluid given and fluid rate increased -Appreciate further GI evaluation   Pneumonia Reports cough.  No dyspnea.  O2 sats 96 to 100% on room air.  CT showing minimal new patchy airspace disease in the right lower lobe.  Patient vomited today, ?  Aspiration. -IV ceftriaxone and doxycycline (QTc 531) -Probiotics   Lower urinary tract infectious disease UA with large leukocytes.  Leukocytosis of 13.1, but rules out for sepsis.  CT showing stable mild left hydronephrosis secondary to 2 calculi within the left renal pelvis measuring up to 1 cm.   -CT also shows indeterminate para-aortic soft tissue mass, can further be evaluated with chest CT, will defer for now or to outpatient setting -Follow-up urine cultures -IV Rocephin -Probiotics  AKI -Prerenal in the setting of pancreatitis, currently resolved -Monitor in a.m.   DVT prophylaxis:Lovenox Code Status: Full Family Communication: Discussed with daughter on phone 6/16 Disposition Plan:  Status is: Inpatient Remains inpatient appropriate because: Requires  ongoing IVF.  Consultants:  GI  Procedures:  None  Antimicrobials:  Anti-infectives (From admission, onward)    Start     Dose/Rate Route Frequency Ordered Stop   05/25/22 2300  cefTRIAXone (ROCEPHIN) 2 g in sodium chloride 0.9 % 100 mL IVPB        2 g 200 mL/hr over 30 Minutes Intravenous Every 24 hours 05/25/22 2148     05/25/22 2230  doxycycline (VIBRAMYCIN) 100 mg in sodium chloride 0.9 % 250 mL IVPB        100 mg 125 mL/hr over 120 Minutes Intravenous Every 12 hours 05/25/22 2147         Subjective: Patient seen and evaluated today with no new acute complaints or concerns. No acute concerns or events noted overnight. He denies abdominal pain or n/v this am.  Objective: Vitals:   05/25/22 2000 05/25/22 2015 05/25/22 2048 05/26/22 0430  BP: 129/73  (!) 144/70 135/70  Pulse: 64 83 73 (!) 59  Resp: '19 15 17 17  '$ Temp:   98.6 F (37 C) 98.2 F (36.8 C)  TempSrc:      SpO2: 95% 95% 100% 96%  Weight:   74 kg   Height:   '5\' 7"'$  (1.702 m)     Intake/Output Summary (Last 24 hours) at 05/26/2022 0937 Last data filed at 05/26/2022 0751 Gross per 24 hour  Intake 536.21 ml  Output 350 ml  Net 186.21 ml   Filed Weights   05/25/22 1623 05/25/22 2048  Weight: 72.6 kg 74 kg    Examination:  General exam: Appears calm and comfortable, hard of hearing  Respiratory system: Clear to auscultation. Respiratory effort normal. Cardiovascular system: S1 & S2 heard, RRR.  Gastrointestinal system: Abdomen is soft Central nervous system: Alert and awake Extremities: No edema Skin: No significant lesions noted Psychiatry: Flat affect.    Data Reviewed: I have personally reviewed following labs and imaging studies  CBC: Recent Labs  Lab 05/25/22 1632 05/26/22 0403  WBC 13.1* 19.4*  NEUTROABS 11.9*  --   HGB 17.1* 16.7  HCT 49.8 50.2  MCV 100.4* 102.7*  PLT 190 892   Basic Metabolic Panel: Recent Labs  Lab 05/25/22 1632 05/26/22 0403  NA 139 142  K 4.1 4.3  CL 102  105  CO2 27 24  GLUCOSE 194* 125*  BUN 25* 26*  CREATININE 1.40* 1.08  CALCIUM 9.5 9.2  MG 1.9  --    GFR: Estimated Creatinine Clearance: 36.6 mL/min (by C-G formula based on SCr of 1.08 mg/dL). Liver Function Tests: Recent Labs  Lab 05/26/22 0403  AST 16  ALT 14  ALKPHOS 41  BILITOT 1.0  PROT 5.9*  ALBUMIN 3.3*   Recent Labs  Lab 05/25/22 1632  LIPASE 6,125*   No results for input(s): "AMMONIA" in the last 168 hours. Coagulation Profile: No results for input(s): "INR", "PROTIME" in the last 168 hours. Cardiac Enzymes: No results for input(s): "CKTOTAL", "CKMB", "CKMBINDEX", "TROPONINI" in the last 168 hours. BNP (last 3 results) No results for input(s): "PROBNP" in the last 8760 hours. HbA1C: No results for input(s): "HGBA1C" in the last 72 hours. CBG: No results for input(s): "GLUCAP" in the last 168 hours. Lipid Profile: Recent Labs    05/26/22 0403  CHOL 207*  HDL 49  LDLCALC 140*  TRIG 89  CHOLHDL 4.2   Thyroid Function Tests: No results for input(s): "TSH", "T4TOTAL", "FREET4", "T3FREE", "THYROIDAB" in the last 72 hours. Anemia Panel: No results for input(s): "VITAMINB12", "FOLATE", "FERRITIN", "TIBC", "IRON", "RETICCTPCT" in the last 72 hours. Sepsis Labs: No results for input(s): "PROCALCITON", "LATICACIDVEN" in the last 168 hours.  No results found for this or any previous visit (from the past 240 hour(s)).       Radiology Studies: CT Renal Stone Study  Result Date: 05/25/2022 CLINICAL DATA:  Abdominal and back pain. CT abdomen and pelvis 04/03/2022. EXAM: CT ABDOMEN AND PELVIS WITHOUT CONTRAST TECHNIQUE: Multidetector CT imaging of the abdomen and pelvis was performed following the standard protocol without IV contrast. RADIATION DOSE REDUCTION: This exam was performed according to the departmental dose-optimization program which includes automated exposure control, adjustment of the mA and/or kV according to patient size and/or use of  iterative reconstruction technique. COMPARISON:  CT abdomen and pelvis 04/03/2022. FINDINGS: Lower chest: There is a stable small left pleural effusion with compressive atelectasis of the left lower lobe. There is some new patchy airspace disease in the right lower lobe. Para-aortic soft tissue density in the lower chest is unchanged measuring 4.4 x 5.1 cm. Hepatobiliary: No focal liver abnormality is seen. Status post cholecystectomy. No biliary dilatation. Pancreas: There is mild inflammatory stranding surrounding the pancreas. No ductal dilatation or fluid collection identified. Spleen: Normal in size without focal abnormality. Adrenals/Urinary Tract: The bladder is decompressed and not well evaluated. Small left bladder diverticulum again seen. There is stable mild left-sided hydronephrosis. Again seen are 2 calculi within the left renal pelvis measuring 1 cm and 9 mm, unchanged. There are additional bilateral nonobstructing renal calculi also unchanged. Bilateral renal cysts are similar to the prior study. Hyperdense cyst in the right mid kidney  appears unchanged. Hyperdense cyst in the inferior pole the right kidney appears unchanged. Adrenal glands are within normal limits. Stomach/Bowel: Stomach is within normal limits. Appendix appears normal. No evidence of bowel wall thickening, distention, or inflammatory changes. There is diffuse colonic diverticulosis. Vascular/Lymphatic: Aortic atherosclerosis. No enlarged abdominal or pelvic lymph nodes. Reproductive: Prostate gland is enlarged, unchanged. Other: Fat containing right inguinal hernia is again seen. There is no ascites. Musculoskeletal: Degenerative changes affect the spine. IMPRESSION: 1. Findings compatible with acute uncomplicated pancreatitis. 2. Stable mild left-sided hydronephrosis secondary to 2 calculi within the left renal pelvis measuring up to 1 cm. 3. Unchanged lower thoracic para-aortic soft tissue mass, indeterminate. This can be further  evaluated with chest CT. 4. Minimal new patchy airspace disease in the right lower lobe. Stable small left pleural effusion. 5. Stable right renal calculi without hydronephrosis. 6. Colonic diverticulosis. 7. Prostatomegaly. 8.  Aortic Atherosclerosis (ICD10-I70.0). Electronically Signed   By: Ronney Asters M.D.   On: 05/25/2022 17:12        Scheduled Meds:  enoxaparin (LOVENOX) injection  40 mg Subcutaneous QHS   lactobacillus acidophilus  2 tablet Oral TID   levothyroxine  50 mcg Oral Daily   Continuous Infusions:  cefTRIAXone (ROCEPHIN)  IV 2 g (05/25/22 2208)   doxycycline (VIBRAMYCIN) IV 100 mg (05/25/22 2301)   lactated ringers 75 mL/hr at 05/25/22 2257     Chase: 1 day    Time spent: 35 minutes    Chase Chase D Manuella Ghazi, DO [facility]  If 7PM-7AM, please contact night-coverage [email or web address] 05/26/2022, 9:37 AM

## 2022-05-26 NOTE — Evaluation (Signed)
Physical Therapy Evaluation Patient Details Name: Chase Chase MRN: [id number] DOB: 1922-10-04 Today's Date: 05/26/2022  History of Present Illness  Chase Chase is a 88 y.o. male with medical history significant for complete heart block, pacemaker, hard of hearing.  Patient presented to the ED with complaints of abdominal pain back pain and vomiting.  Patient was seen by his outpatient provider today was diagnosed with a UTI and given a shot of Rocephin to continue with oral ciprofloxacin .  Patient has been having abdominal pain over the past few days.  When he got home from his PCPs office, the abdominal pain significantly worsened and he started vomiting.  No blood in vomitus.  Reports a mild cough.  No difficulty breathing.  No diarrhea.   Clinical Impression  Patient limited for functional mobility as stated below secondary to BLE weakness, fatigue and impaired standing balance. Patient completes bed mobility with increased time but without assist. He demonstrates good sitting balance and sitting tolerance EOB. He requires assist to transfer to standing with RW and for balance upon standing initially. Patient able to ambulate with RW with intermittent unsteadiness but without loss of balance. Patient will benefit from continued physical therapy in hospital and recommended venue below to increase strength, balance, endurance for safe ADLs and gait.        Recommendations for follow up therapy are one component of a multi-disciplinary discharge planning process, led by the attending physician.  Recommendations may be updated based on patient status, additional functional criteria and insurance authorization.  Follow Up Recommendations Skilled nursing-short term rehab (<3 hours/day)    Assistance Recommended at Discharge Intermittent Supervision/Assistance  Patient can return home with the following  A little help with walking and/or transfers;A little help with  bathing/dressing/bathroom;Assistance with cooking/housework;Assist for transportation;Help with stairs or ramp for entrance    Equipment Recommendations None recommended by PT  Recommendations for Other Services       Functional Status Assessment Patient has had a recent decline in their functional status and demonstrates the ability to make significant improvements in function in a reasonable and predictable amount of time.     Precautions / Restrictions Precautions Precautions: Fall Restrictions Weight Bearing Restrictions: No      Mobility  Bed Mobility Overal bed mobility: Needs Assistance Bed Mobility: Supine to Sit     Supine to sit: Supervision, Min guard, HOB elevated     General bed mobility comments: slow, labored    Transfers Overall transfer level: Needs assistance Equipment used: Rolling walker (2 wheels) Transfers: Sit to/from Stand Sit to Stand: Mod assist           General transfer comment: requires assist and cueing to transfer to standing with RW, unsteady upon standing    Ambulation/Gait Ambulation/Gait assistance: Min assist Gait Distance (Feet): 80 Feet Assistive device: Rolling walker (2 wheels) Gait Pattern/deviations: Trunk flexed, Decreased stride length Gait velocity: decreased     General Gait Details: slow cadence with RW, slightly unsteady  Stairs            Wheelchair Mobility    Modified Rankin (Stroke Patients Only)       Balance Overall balance assessment: Needs assistance Sitting-balance support: Bilateral upper extremity supported, Feet supported Sitting balance-Leahy Scale: Fair Sitting balance - Comments: seated EOB   Standing balance support: Reliant on assistive device for balance, Bilateral upper extremity supported Standing balance-Leahy Scale: Fair Standing balance comment: poor to fair with RW  Pertinent Vitals/Pain Pain Assessment Pain Assessment: No/denies  pain    Home Living Family/patient expects to be discharged to:: Private residence Living Arrangements: Alone Available Help at Discharge: Family;Available PRN/intermittently Type of Home: House Home Access: Stairs to enter Entrance Stairs-Rails: None Entrance Stairs-Number of Steps: 2   Home Layout: One level Home Equipment: Conservation officer, nature (2 wheels);Rollator (4 wheels);Wheelchair - manual      Prior Function Prior Level of Function : Needs assist             Mobility Comments: household ambulation with RW, rides golf cart if he wants to go through yard ADLs Comments: family assists with most ADL; patient tries to dress independently; aid 2x/week for bathing     Hand Dominance        Extremity/Trunk Assessment   Upper Extremity Assessment Upper Extremity Assessment: Generalized weakness    Lower Extremity Assessment Lower Extremity Assessment: Generalized weakness    Cervical / Trunk Assessment Cervical / Trunk Assessment: Kyphotic  Communication   Communication: No difficulties  Cognition Arousal/Alertness: Awake/alert Behavior During Therapy: WFL for tasks assessed/performed Overall Cognitive Status: Within Functional Limits for tasks assessed                                          General Comments      Exercises     Assessment/Plan    PT Assessment Patient needs continued PT services  PT Problem List Decreased strength;Decreased mobility;Decreased activity tolerance;Decreased balance       PT Treatment Interventions DME instruction;Therapeutic activities;Gait training;Therapeutic exercise;Stair training;Balance training;Functional mobility training;Neuromuscular re-education;Patient/family education;Manual techniques    PT Goals (Current goals can be found in the Care Plan section)  Acute Rehab PT Goals Patient Stated Goal: return home PT Goal Formulation: With patient Time For Goal Achievement: 06/09/22 Potential to Achieve  Goals: Good    Frequency Min 3X/week     Co-evaluation               AM-PAC PT "6 Clicks" Mobility  Outcome Measure Help needed turning from your back to your side while in a flat bed without using bedrails?: A Little Help needed moving from lying on your back to sitting on the side of a flat bed without using bedrails?: A Little Help needed moving to and from a bed to a chair (including a wheelchair)?: A Lot Help needed standing up from a chair using your arms (e.g., wheelchair or bedside chair)?: A Lot Help needed to walk in hospital room?: A Lot Help needed climbing 3-5 steps with a railing? : A Lot 6 Click Score: 14    End of Session Equipment Utilized During Treatment: Gait belt Activity Tolerance: Patient tolerated treatment well Patient left: with family/visitor present;in bed;with call bell/phone within reach;with nursing/sitter in room Nurse Communication: Mobility status PT Visit Diagnosis: Unsteadiness on feet (R26.81);Other abnormalities of gait and mobility (R26.89);Muscle weakness (generalized) (M62.81)    Time: 7106-2694 PT Time Calculation (min) (ACUTE ONLY): 21 min   Charges:   PT Evaluation $PT Eval Low Complexity: 1 Low PT Treatments $Therapeutic Activity: 8-22 mins        12:36 PM, 05/26/22 Mearl Latin PT, DPT Physical Therapist at [facility]

## 2022-05-26 NOTE — TOC Initial Note (Signed)
Transition of Care [facility]) - Initial/Assessment Note    Patient Details  Name: Chase Chase MRN: [id number] Date of Birth: 1922-10-10  Transition of Care [facility] CM/SW Contact:    Iona Beard, [place] Phone Number: 05/26/2022, 11:23 AM  Clinical Narrative:                 TOC consulted at wish of family to talk about discharge planning. CSW spoke with pts daughter Ms. Owens Shark about D/C options. Pt lives alone and has been having more difficulty with completing his ADLs. Pt is able to dress himself in the mornings. Ms. Owens Shark provides all meals for pt. Someone comes into the home to bathe pt twice a week.   CSW explained SNF vs HH with Ms. Owens Shark. CSW also explained ALF placement and that it is not done from the hospital. Ms. Owens Shark would like to see how pt does with PT and then make decisions. TOC to follow.   Expected Discharge Plan: [place] Barriers to Discharge: Continued Medical Work up   Patient Goals and CMS Choice Patient states their goals for this hospitalization and ongoing recovery are:: get better CMS Medicare.gov Compare Post Acute Care list provided to:: Patient Choice offered to / list presented to : Patient, Adult Children  Expected Discharge Plan and Services Expected Discharge Plan: [place]       Living arrangements for the past 2 months: Single Family Home                                      Prior Living Arrangements/Services Living arrangements for the past 2 months: Single Family Home Lives with:: Self Patient language and need for interpreter reviewed:: Yes Do you feel safe going back to the place where you live?: Yes      Need for Family Participation in Patient Care: Yes (Comment) Care giver support system in place?: Yes (comment)   Criminal Activity/Legal Involvement Pertinent to Current Situation/Hospitalization: No - Comment as needed  Activities of Daily Living Home Assistive Devices/Equipment:  Walker (specify type) ADL Screening (condition at time of admission) Patient's cognitive ability adequate to safely complete daily activities?: Yes Is the patient deaf or have difficulty hearing?: Yes Does the patient have difficulty seeing, even when wearing glasses/contacts?: No Does the patient have difficulty concentrating, remembering, or making decisions?: No Patient able to express need for assistance with ADLs?: Yes Does the patient have difficulty dressing or bathing?: No Independently performs ADLs?: Yes (appropriate for developmental age) Does the patient have difficulty walking or climbing stairs?: No Weakness of Legs: None Weakness of Arms/Hands: None  Permission Sought/Granted                  Emotional Assessment Appearance:: Appears stated age       Alcohol / Substance Use: Not Applicable Psych Involvement: No (comment)  Admission diagnosis:  Acute pancreatitis [K85.90] Patient Active Problem List   Diagnosis Date Noted   Acute pancreatitis 05/25/2022   Pneumonia 05/25/2022   Pacemaker 02/01/2021   CHB (complete heart block) ([place]) 09/18/2019   Neck stiffness 04/24/2013   Postural imbalance 04/24/2013   Lower urinary tract infectious disease 04/08/2013   Near syncope 04/08/2013   Dehydration 04/08/2013   History of vertigo 04/08/2013   Abnormal EKG 04/08/2013   Abnormal CXR (chest x-ray) 01/10/2012   Bronchitis with flu 12/10/2011   Glaucoma  12/10/2011   PCP:  Sharilyn Sites, MD Pharmacy:   [facility] [place], [place] to [address] [employer] [address] [place] [address] Phone: [phone] Fax: [phone]  [facility] [phone] - [place], [place] AT [place] [address] [place] [address] Phone: [phone] Fax: [phone]     Social Determinants of Health (SDOH) Interventions    Readmission Risk  Interventions     No data to display

## 2022-05-26 NOTE — Plan of Care (Signed)
  Problem: Acute Rehab PT Goals(only PT should resolve) Goal: Patient Will Transfer Sit To/From Stand Outcome: Progressing Flowsheets (Taken 05/26/2022 1238) Patient will transfer sit to/from stand:  with supervision  with min guard assist Goal: Pt Will Transfer Bed To Chair/Chair To Bed Outcome: Progressing Flowsheets (Taken 05/26/2022 1238) Pt will Transfer Bed to Chair/Chair to Bed:  with supervision  min guard assist Goal: Pt Will Ambulate Outcome: Progressing Flowsheets (Taken 05/26/2022 1238) Pt will Ambulate:  > 125 feet  with supervision  with least restrictive assistive device Goal: Pt/caregiver will Perform Home Exercise Program Outcome: Progressing Flowsheets (Taken 05/26/2022 1238) Pt/caregiver will Perform Home Exercise Program:  For increased strengthening  For improved balance  Independently  12:39 PM, 05/26/22 Mearl Latin PT, DPT Physical Therapist at [facility]

## 2022-05-26 NOTE — Consult Note (Addendum)
Gastroenterology Consult   Referring Provider: No ref. provider found Primary Care Physician:  Sharilyn Sites, MD Primary Gastroenterologist:  previously unassigned  Patient ID: Chase Chase; [id number]; October 24, 1922   Admit date: 05/25/2022  LOS: 1 day   Date of Consultation: 05/26/2022  Reason for Consultation:  Pancreatitis  History of Present Illness   Chase Chase is a 88 y.o. year old male with a history of hypercholesteremia, vertigo, glaucoma, complete heart block s/p dual chamber pacemaker , frequent UTIs, and history of cholecystectomy. He recently seen his PCP with back pain and was diagnosed with a UTI, given a shot of Rocephin and sent home on antibiotics. After going home his abdominal pain increased and he began vomiting. GI consulted for CT evidence of pancreatitis.    ED Course: CT renal stone study - mild inflammatory stranding surrounding the pancreas consistent with uncomplicated pancreatitis, s/p cholecystectomy, no biliary ductal dilation, no pancreatic ductal dilation Labs: Lipase 6125, Hgb 17.1, MCV 100.4, Cr 1.4, GFR 46, BUN 25, WBC 13.1 UA with large leukocytes. Vital signs stable. Received 1L LR bolus, fluids initiated (LR @ 75).   Consult: Lipid panel with normal triglycerides, cholesterol 207, LDL 140  History provided by patient and his daughter Chase Chase.  Patient denies any alcohol use, has not smoked in over 40 years.  Went to PCP for abdominal pain radiating to the back yesterday and was treated with Rocephin.  Stated they did diagnose him with a UTI and stated that if his pain did not get better to present to the ED.  Yesterday evening he got severe abdominal pain and started vomiting.  Patient denies any weight loss or diarrhea.  Actually has been a lot more in the constipated side, thinks his last bowel movement was about 3 days ago.  Denies dysphagia, lack appetite, early satiety.  Daughter and he stated that yesterday it was hard to get him to take in  any p.o. intake including any liquids.  This morning patient denies any abdominal pain, nausea, or vomiting.  Stating that he feels much better today than he did yesterday.  Reporting " that was an awful feeling yesterday".  Blood sugars have only been mildly elevated, patient not on any hypoglycemic meds outpatient.  Denies frequent NSAID use, usually as needed.  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  Not very active at home, usually sits outside on his porch or his golf cart and does tend to sleep a lot.  He states he relies on his daughter to remember a lot of things and to help him at home.   Past Medical History:  Diagnosis Date   At risk of UTI    CHB (complete heart block) ([place]) 09/2019   CHB (complete heart block) ([place]) 09/2019   Glaucoma    Glaucoma    HOH (hard of hearing)    Hypercholesterolemia    Pacemaker 09/19/2019   Biotronik dual chamber PPM   Prostate disease    unspecified   Vertigo     Past Surgical History:  Procedure Laterality Date   CHOLECYSTECTOMY     HERNIA REPAIR     bilateral   PACEMAKER IMPLANT N/A 09/19/2019   Procedure: PACEMAKER IMPLANT;  Surgeon: Evans Lance, MD;  Location: [facility];  Service: Cardiovascular;  Laterality: N/A;    Prior to Admission medications   Medication Sig Start Date End Date Taking? Authorizing Provider  latanoprost (XALATAN) 0.005 % ophthalmic solution Place  1 drop into both eyes at bedtime as needed.  08/01/19  Yes [provider]  levothyroxine (SYNTHROID) 50 MCG tablet Take 50 mcg by mouth daily. 12/19/19  Yes [provider]  meclizine (ANTIVERT) 25 MG tablet Take 25 mg by mouth as needed for dizziness (rarely needs).   Yes [provider]  Misc Natural Products (PROSTATE HEALTH PO) Take 2 tablets by mouth daily.   Yes [provider]  naproxen sodium (ALEVE) 220 MG tablet Take 220 mg by mouth daily as needed.   Yes [provider]   Omega-3 Fatty Acids (FISH OIL) 645 MG CAPS Take 1,290 mg by mouth daily. Take 2 capsules daily for cholesterol.   Yes [provider]  Vitamin D, Ergocalciferol, (DRISDOL) 1.25 MG (50000 UT) CAPS capsule Take 1 capsule by mouth every 30 (thirty) days. 08/23/19  Yes [provider]  zinc gluconate 50 MG tablet Take 50 mg by mouth daily.   Yes [provider]  alfuzosin (UROXATRAL) 10 MG 24 hr tablet Take 1 tablet (10 mg total) by mouth at bedtime. Patient not taking: Reported on 05/25/2022 03/15/22   Cleon Gustin, MD  amoxicillin-clavulanate (AUGMENTIN) 875-125 MG tablet Take 1 tablet by mouth every 12 (twelve) hours. Patient not taking: Reported on 05/25/2022 03/08/22   [place]lin, Berneice Heinrich, PA-C  ciprofloxacin (CIPRO) 500 MG tablet Take 500 mg by mouth 2 (two) times daily. 05/25/22   [provider]    Current Facility-Administered Medications  Medication Dose Route Frequency Provider Last Rate Last Admin   acetaminophen (TYLENOL) tablet 650 mg  650 mg Oral Q6H PRN Emokpae, Ejiroghene E, MD       Or   acetaminophen (TYLENOL) suppository 650 mg  650 mg Rectal Q6H PRN Emokpae, Ejiroghene E, MD       cefTRIAXone (ROCEPHIN) 2 g in sodium chloride 0.9 % 100 mL IVPB  2 g Intravenous Q24H Emokpae, Ejiroghene E, MD 200 mL/hr at 05/25/22 2208 2 g at 05/25/22 2208   doxycycline (VIBRAMYCIN) 100 mg in sodium chloride 0.9 % 250 mL IVPB  100 mg Intravenous Q12H Emokpae, Ejiroghene E, MD 125 mL/hr at 05/25/22 2301 100 mg at 05/25/22 2301   enoxaparin (LOVENOX) injection 30 mg  30 mg Subcutaneous QHS Emokpae, Ejiroghene E, MD   30 mg at 05/25/22 2253   lactated ringers infusion   Intravenous Continuous Emokpae, Ejiroghene E, MD 75 mL/hr at 05/25/22 2257 New Bag at 05/25/22 2257   lactobacillus acidophilus (BACID) tablet 2 tablet  2 tablet Oral TID Emokpae, Ejiroghene E, MD       levothyroxine (SYNTHROID) tablet 50 mcg  50 mcg Oral Daily Emokpae, Ejiroghene E, MD    50 mcg at 05/26/22 0527   morphine (PF) 2 MG/ML injection 2 mg  2 mg Intravenous Q4H PRN Emokpae, Ejiroghene E, MD       polyethylene glycol (MIRALAX / GLYCOLAX) packet 17 g  17 g Oral Daily PRN Emokpae, Ejiroghene E, MD       prochlorperazine (COMPAZINE) injection 10 mg  10 mg Intravenous Q6H PRN Emokpae, Ejiroghene E, MD        Allergies as of 05/25/2022   (No Known Allergies)    Family History  Problem Relation Age of Onset   Breast cancer Sister    Lung cancer Brother    Lung cancer Brother    Lung cancer Brother    Breast cancer Sister    Breast cancer Sister     Social History  Socioeconomic History   Marital status: Widowed    Spouse name: Not on file   Number of children: Not on file   Years of education: Not on file   Highest education level: Not on file  Occupational History   Occupation: retired  Tobacco Use   Smoking status: Former    Packs/day: 2.00    Years: 8.00    Total pack years: 16.00    Types: Cigarettes   Smokeless tobacco: Former    Types: Chew    Quit date: 10/12/2011   Tobacco comments:    Chewing and snuff  Vaping Use   Vaping Use: Never used  Substance and Sexual Activity   Alcohol use: No   Drug use: No   Sexual activity: Not on file  Other Topics Concern   Not on file  Social History Narrative   Not on file   Social Determinants of Health   Financial Resource Strain: Not on file  Food Insecurity: Not on file  Transportation Needs: Not on file  Physical Activity: Not on file  Stress: Not on file  Social Connections: Not on file  Intimate Partner Violence: Not on file     Review of Systems   Gen: Denies any fever, chills, loss of appetite, change in weight or weight loss CV: Denies chest pain, heart palpitations, syncope, edema  Resp: Denies shortness of breath with rest, cough, wheezing, coughing up blood, and pleurisy. GI: see HPI GU : Denies urinary burning, blood in urine, urinary frequency, and urinary  incontinence. MS: Denies joint pain, limitation of movement, swelling, cramps, and atrophy.  Derm: Denies rash, itching, dry skin, hives. Psych: Denies depression, anxiety, memory loss, hallucinations, and confusion. Heme: Denies bruising or bleeding Neuro:  Denies any headaches, dizziness, paresthesias, shaking  Physical Exam   Vital Signs in last 24 hours: Temp:  [98.2 F (36.8 C)-98.6 F (37 C)] 98.2 F (36.8 C) (06/16 0430) Pulse Rate:  [59-86] 59 (06/16 0430) Resp:  [15-24] 17 (06/16 0430) BP: (129-161)/(67-80) 135/70 (06/16 0430) SpO2:  [95 %-100 %] 96 % (06/16 0430) Weight:  [72.6 kg-74 kg] 74 kg (06/15 2048)    General:   Alert, pleasant and cooperative in NAD Head:  Normocephalic and atraumatic. Eyes:  Sclera clear, no icterus.   Conjunctiva pink. Ears: Hearing aids present Neck:  Supple; no masses Lungs:  Clear throughout to auscultation diminished in the bases.   No wheezes, crackles, or rhonchi. No acute distress. Heart:  Regular rate and rhythm; no murmurs, clicks, rubs,  or gallops. Abdomen:  Soft, nondistended.  Mildly tender to epigastric region.  No masses, hepatosplenomegaly or hernias noted. Normal bowel sounds, without guarding, and without rebound.   Rectal: Deferred Msk:  Symmetrical without gross deformities. Normal posture. Extremities:  Without clubbing or edema. Neurologic:  Alert and  oriented x4. Skin:  Intact without significant lesions or rashes. Psych:  Alert and cooperative. Normal mood and affect.  Intake/Output from previous day: 06/15 0701 - 06/16 0700 In: 536.2 [I.V.:186.2; IV Piggyback:350] Out: 50 [Emesis/NG output:50] Intake/Output this shift: Total I/O In: -  Out: 300 [Urine:300]   Labs/Studies   Recent Labs Recent Labs    05/25/22 1632 05/26/22 0403  WBC 13.1* 19.4*  HGB 17.1* 16.7  HCT 49.8 50.2  PLT 190 174   BMET Recent Labs    05/25/22 1632 05/26/22 0403  NA 139 142  K 4.1 4.3  CL 102 105  CO2 27 24   GLUCOSE 194* 125*  BUN  25* 26*  CREATININE 1.40* 1.08  CALCIUM 9.5 9.2   LFT No results for input(s): "PROT", "ALBUMIN", "AST", "ALT", "ALKPHOS", "BILITOT", "BILIDIR", "IBILI" in the last 72 hours. PT/INR No results for input(s): "LABPROT", "INR" in the last 72 hours. Hepatitis Panel No results for input(s): "HEPBSAG", "HCVAB", "HEPAIGM", "HEPBIGM" in the last 72 hours. C-Diff No results for input(s): "CDIFFTOX" in the last 72 hours.  Radiology/Studies CT Renal Stone Study  Result Date: 05/25/2022 CLINICAL DATA:  Abdominal and back pain. CT abdomen and pelvis 04/03/2022. EXAM: CT ABDOMEN AND PELVIS WITHOUT CONTRAST TECHNIQUE: Multidetector CT imaging of the abdomen and pelvis was performed following the standard protocol without IV contrast. RADIATION DOSE REDUCTION: This exam was performed according to the departmental dose-optimization program which includes automated exposure control, adjustment of the mA and/or kV according to patient size and/or use of iterative reconstruction technique. COMPARISON:  CT abdomen and pelvis 04/03/2022. FINDINGS: Lower chest: There is a stable small left pleural effusion with compressive atelectasis of the left lower lobe. There is some new patchy airspace disease in the right lower lobe. Para-aortic soft tissue density in the lower chest is unchanged measuring 4.4 x 5.1 cm. Hepatobiliary: No focal liver abnormality is seen. Status post cholecystectomy. No biliary dilatation. Pancreas: There is mild inflammatory stranding surrounding the pancreas. No ductal dilatation or fluid collection identified. Spleen: Normal in size without focal abnormality. Adrenals/Urinary Tract: The bladder is decompressed and not well evaluated. Small left bladder diverticulum again seen. There is stable mild left-sided hydronephrosis. Again seen are 2 calculi within the left renal pelvis measuring 1 cm and 9 mm, unchanged. There are additional bilateral nonobstructing renal calculi  also unchanged. Bilateral renal cysts are similar to the prior study. Hyperdense cyst in the right mid kidney appears unchanged. Hyperdense cyst in the inferior pole the right kidney appears unchanged. Adrenal glands are within normal limits. Stomach/Bowel: Stomach is within normal limits. Appendix appears normal. No evidence of bowel wall thickening, distention, or inflammatory changes. There is diffuse colonic diverticulosis. Vascular/Lymphatic: Aortic atherosclerosis. No enlarged abdominal or pelvic lymph nodes. Reproductive: Prostate gland is enlarged, unchanged. Other: Fat containing right inguinal hernia is again seen. There is no ascites. Musculoskeletal: Degenerative changes affect the spine. IMPRESSION: 1. Findings compatible with acute uncomplicated pancreatitis. 2. Stable mild left-sided hydronephrosis secondary to 2 calculi within the left renal pelvis measuring up to 1 cm. 3. Unchanged lower thoracic para-aortic soft tissue mass, indeterminate. This can be further evaluated with chest CT. 4. Minimal new patchy airspace disease in the right lower lobe. Stable small left pleural effusion. 5. Stable right renal calculi without hydronephrosis. 6. Colonic diverticulosis. 7. Prostatomegaly. 8.  Aortic Atherosclerosis (ICD10-I70.0). Electronically Signed   By: Ronney Asters M.D.   On: 05/25/2022 17:12     Assessment   Chase Chase is a 88 y.o. year old male with a history of hypercholesteremia, vertigo, glaucoma, complete heart block s/p dual chamber pacemaker , frequent UTIs, and history of cholecystectomy. He recently seen his PCP with back pain and was diagnosed with a UTI, given a shot of Rocephin and sent home on antibiotics. After going home his abdominal pain increased and he began vomiting. GI consulted for CT evidence of pancreatitis.   Acute pancreatitis: Noted on CT renal stone imaging. Patient presented with abdominal pain and back pain and some vomiting. VSS on admission with Lipase  significantly elevated to 6125. He was given 1L LR and started infusion @ 75 cc/hr. Triglycerides normal at 89. History of  cholecystectomy, no biliary or pancreatic ductal dilation on CT. LFTs WNL. Of note patient presents also with possible pneumonia and UTI of which he has had frequent UTIs recently. Etiology unclear at this time as biliary pancreatitis unlikely. Patient denies alcohol use and is a former smoker, not current smoker (quit many years ago, 30+). Differentials continue to include infection vs drug induced (antibiotics) vs malignancy. No obvious mass noted on imaging. Has received 1L bolus in the ED and receiving additional bolus this morning. Currently receiving LR at 100/hr. Patient indicates no overt abdominal pain this morning, only mild tenderness to epigastric region on exam.  Patient should receive follow-up pancreatic imaging in about 12 weeks to rule out malignancy as cause for pancreatitis. of note patient has lower thoracic soft tissue mass that is recommended to be follow-up with chest CT Will continue to monitor symptoms.  Continue to follow HFP  Constipation: No bowel movement in at least 3 days, patient is unsure about his regularity at home.  Distant active bowel sounds.  We will change MiraLAX from prn to nightly.  Currently receiving doxycyline and rocephin for CAP.  Although rare these antibiotics have the potential to cause pancreatitis. Thoracic para-aortic soft tissue mass indeterminate, would recommend further evaluation with chest CT  Plan / Recommendations   HFP add-on, would recheck tomorrow Continue supportive care with IV fluids @ 100/hr Keep NPO for today, may consider advancing to clears later today or tomorrow morning Antiemetics and pain control per hospitalist.  Continue probiotic We will change MiraLAX from as needed to nightly. Evaluation of lower thoracic tissue mass with chest CT per hospitalist Follow up pancreatic imaging in about 12 weeks.       05/26/2022, 8:04 AM  Venetia Night, MSN, FNP-BC, AGACNP-BC [facility]

## 2022-05-27 DIAGNOSIS — K851 Biliary acute pancreatitis without necrosis or infection: Secondary | ICD-10-CM | POA: Diagnosis not present

## 2022-05-27 LAB — COMPREHENSIVE METABOLIC PANEL
ALT: 198 U/L — ABNORMAL HIGH (ref 0–44)
AST: 114 U/L — ABNORMAL HIGH (ref 15–41)
Albumin: 3.1 g/dL — ABNORMAL LOW (ref 3.5–5.0)
Alkaline Phosphatase: 174 U/L — ABNORMAL HIGH (ref 38–126)
Anion gap: 7 (ref 5–15)
BUN: 26 mg/dL — ABNORMAL HIGH (ref 8–23)
CO2: 26 mmol/L (ref 22–32)
Calcium: 8.3 mg/dL — ABNORMAL LOW (ref 8.9–10.3)
Chloride: 107 mmol/L (ref 98–111)
Creatinine, Ser: 0.99 mg/dL (ref 0.61–1.24)
GFR, Estimated: >60 mL/min (ref >60)
Glucose, Bld: 84 mg/dL (ref 70–99)
Potassium: 3.6 mmol/L (ref 3.5–5.1)
Sodium: 140 mmol/L (ref 135–145)
Total Bilirubin: 1.3 mg/dL — ABNORMAL HIGH (ref 0.3–1.2)
Total Protein: 5.9 g/dL — ABNORMAL LOW (ref 6.5–8.1)

## 2022-05-27 LAB — CBC
HCT: 46.3 % (ref 39.0–52.0)
Hemoglobin: 15.4 g/dL (ref 13.0–17.0)
MCH: 34.1 pg — ABNORMAL HIGH (ref 26.0–34.0)
MCHC: 33.3 g/dL (ref 30.0–36.0)
MCV: 102.7 fL — ABNORMAL HIGH (ref 80.0–100.0)
Platelets: 155 10*3/uL (ref 150–400)
RBC: 4.51 MIL/uL (ref 4.22–5.81)
RDW: 12.2 % (ref 11.5–15.5)
WBC: 17.3 10*3/uL — ABNORMAL HIGH (ref 4.0–10.5)
nRBC: 0 % (ref 0.0–0.2)

## 2022-05-27 LAB — URINE CULTURE: Culture: 60000 — AB

## 2022-05-27 LAB — MAGNESIUM: Magnesium: 1.8 mg/dL (ref 1.7–2.4)

## 2022-05-27 LAB — LIPASE, BLOOD: Lipase: 53 U/L — ABNORMAL HIGH (ref 11–51)

## 2022-05-27 MED ORDER — SODIUM CHLORIDE 0.9 % IV SOLN
3.0000 g | Freq: Two times a day (BID) | INTRAVENOUS | Status: DC
  Administered 2022-05-27 (×2): 3 g via INTRAVENOUS
  Filled 2022-05-27 (×3): qty 8

## 2022-05-27 NOTE — TOC Progression Note (Signed)
Transition of Care [facility]) - Progression Note    Patient Details  Name: PANAYIOTIS RAINVILLE MRN: [id number] Date of Birth: Mar 16, 1922  Transition of Care [facility]) CM/SW Contact  Joanne Chars, LCSW Phone Number: 05/27/2022, 9:12 AM  Clinical Narrative:   CSW spoke with pt daughter Langley Gauss, updated her on PT recommendations for SNF.  She would like to talk with other family members, they are leaning towards having pt go home with [facility] if that is at all possible.  Pt lives alone but family comes frequently to check on him.  She will discuss and call back.     Expected Discharge Plan: [place] Barriers to Discharge: Continued Medical Work up  Expected Discharge Plan and Services Expected Discharge Plan: [place] arrangements for the past 2 months: Single Family Home                                       Social Determinants of Health (SDOH) Interventions    Readmission Risk Interventions     No data to display

## 2022-05-27 NOTE — Progress Notes (Signed)
PROGRESS NOTE    Chase Chase  EUM:[id number] DOB: 10-Apr-1922 DOA: 05/25/2022 PCP: Sharilyn Sites, MD   Brief Narrative:    Chase Chase is a 88 y.o. male with medical history significant for complete heart block, pacemaker, hard of hearing. Patient presented to the ED with complaints of abdominal pain back pain and vomiting.  He has been admitted with acute pancreatitis and is also noted to have pneumonia and UTI and started on IV antibiotics.  Noted to have enterococcal UTI for which she will be changed to Unasyn.  Assessment & Plan:   Principal Problem:   Acute pancreatitis Active Problems:   Lower urinary tract infectious disease   Pneumonia   CHB (complete heart block) (HCC)   Pacemaker  Assessment and Plan:  Acute pancreatitis Presenting with abdominal pain or vomiting.  CT shows complicated acute pancreatitis.  Denies alcohol use.  Has had cholecystectomy.  -Advance to clear liquid diet 6/17 -Lipid panel unremarkable -Another bolus of IV fluid given and fluid rate increased -Appreciate further GI evaluation   Pneumonia Reports cough.  No dyspnea.  O2 sats 96 to 100% on room air.  CT showing minimal new patchy airspace disease in the right lower lobe.  Patient vomited today, ?  Aspiration. -IV Unasyn for now -Probiotics   Enterococcal UTI UA with large leukocytes.  Leukocytosis of 13.1, but rules out for sepsis.  CT showing stable mild left hydronephrosis secondary to 2 calculi within the left renal pelvis measuring up to 1 cm.   -CT also shows indeterminate para-aortic soft tissue mass, can further be evaluated with chest CT, will defer for now or to outpatient setting -Change antibiotics to IV Unasyn and continue   AKI -Prerenal in the setting of pancreatitis, currently resolved -Monitor in a.m.   DVT prophylaxis:Lovenox Code Status: Full Family Communication: Discussed with daughter on phone 6/17 Disposition Plan: Home health by 6/18 Status is:  Inpatient Remains inpatient appropriate because: Requires ongoing IVF.   Consultants:  GI   Procedures:  None  Antimicrobials:  Anti-infectives (From admission, onward)    Start     Dose/Rate Route Frequency Ordered Stop   05/27/22 1100  Ampicillin-Sulbactam (UNASYN) 3 g in sodium chloride 0.9 % 100 mL IVPB        3 g 200 mL/hr over 30 Minutes Intravenous Every 12 hours 05/27/22 0958     05/25/22 2300  cefTRIAXone (ROCEPHIN) 2 g in sodium chloride 0.9 % 100 mL IVPB  Status:  Discontinued        2 g 200 mL/hr over 30 Minutes Intravenous Every 24 hours 05/25/22 2148 05/27/22 0954   05/25/22 2230  doxycycline (VIBRAMYCIN) 100 mg in sodium chloride 0.9 % 250 mL IVPB  Status:  Discontinued        100 mg 125 mL/hr over 120 Minutes Intravenous Every 12 hours 05/25/22 2147 05/27/22 0954       Subjective: Patient seen and evaluated today with no new acute complaints or concerns. No acute concerns or events noted overnight.  He denies any abdominal pain, nausea, or vomiting.  Objective: Vitals:   05/26/22 0430 05/26/22 1427 05/26/22 2015 05/27/22 0617  BP: 135/70 (!) 147/72 135/66 (!) 146/64  Pulse: (!) 59 60 (!) 59 64  Resp: '17 20 20 20  '$ Temp: 98.2 F (36.8 C) (!) 97.3 F (36.3 C) 98.6 F (37 C) 97.8 F (36.6 C)  TempSrc:   Oral   SpO2: 96% 97% 97% 98%  Weight:  Height:        Intake/Output Summary (Last 24 hours) at 05/27/2022 1002 Last data filed at 05/27/2022 0300 Gross per 24 hour  Intake 1653.79 ml  Output 650 ml  Net 1003.79 ml   Filed Weights   05/25/22 1623 05/25/22 2048  Weight: 72.6 kg 74 kg    Examination:  General exam: Appears calm and comfortable  Respiratory system: Clear to auscultation. Respiratory effort normal. Cardiovascular system: S1 & S2 heard, RRR.  Gastrointestinal system: Abdomen is soft Central nervous system: Alert and awake Extremities: No edema Skin: No significant lesions noted Psychiatry: Flat affect.    Data Reviewed: I  have personally reviewed following labs and imaging studies  CBC: Recent Labs  Lab 05/25/22 1632 05/26/22 0403 05/27/22 0617  WBC 13.1* 19.4* 17.3*  NEUTROABS 11.9*  --   --   HGB 17.1* 16.7 15.4  HCT 49.8 50.2 46.3  MCV 100.4* 102.7* 102.7*  PLT 190 174 664   Basic Metabolic Panel: Recent Labs  Lab 05/25/22 1632 05/26/22 0403 05/27/22 0617  NA 139 142 140  K 4.1 4.3 3.6  CL 102 105 107  CO2 '27 24 26  '$ GLUCOSE 194* 125* 84  BUN 25* 26* 26*  CREATININE 1.40* 1.08 0.99  CALCIUM 9.5 9.2 8.3*  MG 1.9  --  1.8   GFR: Estimated Creatinine Clearance: 39.9 mL/min (by C-G formula based on SCr of 0.99 mg/dL). Liver Function Tests: Recent Labs  Lab 05/26/22 0403 05/27/22 0617  AST 16 114*  ALT 14 198*  ALKPHOS 41 174*  BILITOT 1.0 1.3*  PROT 5.9* 5.9*  ALBUMIN 3.3* 3.1*   Recent Labs  Lab 05/25/22 1632 05/27/22 0617  LIPASE 6,125* 53*   No results for input(s): "AMMONIA" in the last 168 hours. Coagulation Profile: No results for input(s): "INR", "PROTIME" in the last 168 hours. Cardiac Enzymes: No results for input(s): "CKTOTAL", "CKMB", "CKMBINDEX", "TROPONINI" in the last 168 hours. BNP (last 3 results) No results for input(s): "PROBNP" in the last 8760 hours. HbA1C: No results for input(s): "HGBA1C" in the last 72 hours. CBG: No results for input(s): "GLUCAP" in the last 168 hours. Lipid Profile: Recent Labs    05/26/22 0403  CHOL 207*  HDL 49  LDLCALC 140*  TRIG 89  CHOLHDL 4.2   Thyroid Function Tests: No results for input(s): "TSH", "T4TOTAL", "FREET4", "T3FREE", "THYROIDAB" in the last 72 hours. Anemia Panel: No results for input(s): "VITAMINB12", "FOLATE", "FERRITIN", "TIBC", "IRON", "RETICCTPCT" in the last 72 hours. Sepsis Labs: No results for input(s): "PROCALCITON", "LATICACIDVEN" in the last 168 hours.  Recent Results (from the past 240 hour(s))  Urine Culture     Status: Abnormal   Collection Time: 05/25/22  4:49 PM   Specimen:  Urine, Clean Catch  Result Value Ref Range Status   Specimen Description   Final    URINE, CLEAN CATCH Performed at [facility], [address] [place], [place] [address]    Special Requests   Final    NONE Performed at [facility], [address] [place], [place] [address]    Culture 60,000 COLONIES/mL ENTEROCOCCUS FAECALIS (A)  Final   Report Status 05/27/2022 FINAL  Final   Organism ID, Bacteria ENTEROCOCCUS FAECALIS (A)  Final      Susceptibility   Enterococcus faecalis - MIC*    AMPICILLIN <=2 SENSITIVE Sensitive     NITROFURANTOIN <=16 SENSITIVE Sensitive     VANCOMYCIN 1 SENSITIVE Sensitive     * 60,000 COLONIES/mL ENTEROCOCCUS FAECALIS  Radiology Studies: CT Renal Stone Study  Result Date: 05/25/2022 CLINICAL DATA:  Abdominal and back pain. CT abdomen and pelvis 04/03/2022. EXAM: CT ABDOMEN AND PELVIS WITHOUT CONTRAST TECHNIQUE: Multidetector CT imaging of the abdomen and pelvis was performed following the standard protocol without IV contrast. RADIATION DOSE REDUCTION: This exam was performed according to the departmental dose-optimization program which includes automated exposure control, adjustment of the mA and/or kV according to patient size and/or use of iterative reconstruction technique. COMPARISON:  CT abdomen and pelvis 04/03/2022. FINDINGS: Lower chest: There is a stable small left pleural effusion with compressive atelectasis of the left lower lobe. There is some new patchy airspace disease in the right lower lobe. Para-aortic soft tissue density in the lower chest is unchanged measuring 4.4 x 5.1 cm. Hepatobiliary: No focal liver abnormality is seen. Status post cholecystectomy. No biliary dilatation. Pancreas: There is mild inflammatory stranding surrounding the pancreas. No ductal dilatation or fluid collection identified. Spleen: Normal in size without focal abnormality. Adrenals/Urinary Tract: The bladder is decompressed and not well evaluated. Small left  bladder diverticulum again seen. There is stable mild left-sided hydronephrosis. Again seen are 2 calculi within the left renal pelvis measuring 1 cm and 9 mm, unchanged. There are additional bilateral nonobstructing renal calculi also unchanged. Bilateral renal cysts are similar to the prior study. Hyperdense cyst in the right mid kidney appears unchanged. Hyperdense cyst in the inferior pole the right kidney appears unchanged. Adrenal glands are within normal limits. Stomach/Bowel: Stomach is within normal limits. Appendix appears normal. No evidence of bowel wall thickening, distention, or inflammatory changes. There is diffuse colonic diverticulosis. Vascular/Lymphatic: Aortic atherosclerosis. No enlarged abdominal or pelvic lymph nodes. Reproductive: Prostate gland is enlarged, unchanged. Other: Fat containing right inguinal hernia is again seen. There is no ascites. Musculoskeletal: Degenerative changes affect the spine. IMPRESSION: 1. Findings compatible with acute uncomplicated pancreatitis. 2. Stable mild left-sided hydronephrosis secondary to 2 calculi within the left renal pelvis measuring up to 1 cm. 3. Unchanged lower thoracic para-aortic soft tissue mass, indeterminate. This can be further evaluated with chest CT. 4. Minimal new patchy airspace disease in the right lower lobe. Stable small left pleural effusion. 5. Stable right renal calculi without hydronephrosis. 6. Colonic diverticulosis. 7. Prostatomegaly. 8.  Aortic Atherosclerosis (ICD10-I70.0). Electronically Signed   By: Ronney Asters M.D.   On: 05/25/2022 17:12        Scheduled Meds:  acidophilus  2 capsule Oral Daily   enoxaparin (LOVENOX) injection  40 mg Subcutaneous QHS   levothyroxine  50 mcg Oral Daily   polyethylene glycol  17 g Oral QPM   Continuous Infusions:  ampicillin-sulbactam (UNASYN) IV     lactated ringers 100 mL/hr at 05/26/22 1056     LOS: 2 days    Time spent: 35 minutes    Porchia Sinkler Darleen Crocker, DO [facility]  If 7PM-7AM, please contact night-coverage [email or web address] 05/27/2022, 10:02 AM

## 2022-05-27 NOTE — Progress Notes (Signed)
Tolerating clear liquid diet.  Patient states abdominal pain has resolved.  Vital signs in last 24 hours: Temp:  [97.3 F (36.3 C)-98.6 F (37 C)] 97.8 F (36.6 C) (06/17 0617) Pulse Rate:  [59-64] 64 (06/17 0617) Resp:  [20] 20 (06/17 0617) BP: (135-147)/(64-72) 146/64 (06/17 0617) SpO2:  [97 %-98 %] 98 % (06/17 0617) Last BM Date : 05/24/22 General:   Alert,  pleasant and cooperative in NAD Abdomen: Nondistended.  Positive bowel sound soft and nontender extremities:  Without clubbing or edema.    Intake/Output from previous day: 06/16 0701 - 06/17 0700 In: 1653.8 [P.O.:240; I.V.:813.8; IV Piggyback:600] Out: 950 [Urine:950] Intake/Output this shift: No intake/output data recorded.  Lab Results: Recent Labs    05/25/22 1632 05/26/22 0403 05/27/22 0617  WBC 13.1* 19.4* 17.3*  HGB 17.1* 16.7 15.4  HCT 49.8 50.2 46.3  PLT 190 174 155   BMET Recent Labs    05/25/22 1632 05/26/22 0403 05/27/22 0617  NA 139 142 140  K 4.1 4.3 3.6  CL 102 105 107  CO2 '27 24 26  '$ GLUCOSE 194* 125* 84  BUN 25* 26* 26*  CREATININE 1.40* 1.08 0.99  CALCIUM 9.5 9.2 8.3*   LFT Recent Labs    05/26/22 0403 05/27/22 0617  PROT 5.9* 5.9*  ALBUMIN 3.3* 3.1*  AST 16 114*  ALT 14 198*  ALKPHOS 41 174*  BILITOT 1.0 1.3*  BILIDIR <0.1  --   IBILI NOT CALCULATED  --     Impression: 88 year old gentleman admitted to the hospital with pancreatitis along with enterococcal UTI and possible pneumonia. Clinically, pancreatitis is improved. He has had a notable bump in his liver function profile this morning.  In this scenario, this is highly suspicious for transient biliary obstruction (?  Passage of a small calculus versus sphincter spasm). I re-reviewed CT scan with Dr. Earle Gell; distal extrahepatic bile duct measures about 12 mm with very minimal intrahepatic biliary dilation.  On balance, this is not very impressive given that he is status postcholecystectomy and nearly 88 years  old.  Recommendations:  Advance to a low-fat diet  Repeat hepatic function profile tomorrow morning  Pancreatic protocol CT scan in 3 months.  Would not pursue biliary tract imaging further at this time so long as he does well clinically and LFTs improve.  He is not a candidate for an MRCP given pacer.

## 2022-05-27 NOTE — Progress Notes (Signed)
Nursing Progress Note   Patient had a bit of nausea today with no vomiting, provided prn nausea med. Patient has also been given two doses of Murelax, but without BM. Continuing to monitor. IV to right AC was leaking and slightly pink. Removed and placed new IV in the left arm. Patient tolerated well. IV fluids infusing.   Vitals:   05/27/22 0617 05/27/22 1420  BP: (!) 146/64 123/61  Pulse: 64 60  Resp: 20 20  Temp: 97.8 F (36.6 C) 98.5 F (36.9 C)  SpO2: 98% 98%    Alfonse Alpers RN, BSN 05/27/2022 6:40 PM

## 2022-05-27 NOTE — Plan of Care (Signed)
  Problem: Education: Goal: Knowledge of General Education information will improve Description: Including pain rating scale, medication(s)/side effects and non-pharmacologic comfort measures Outcome: Progressing   Problem: Health Behavior/Discharge Planning: Goal: Ability to manage health-related needs will improve Outcome: Progressing   Problem: Clinical Measurements: Goal: Ability to maintain clinical measurements within normal limits will improve Outcome: Progressing Goal: Will remain free from infection Outcome: Progressing Goal: Diagnostic test results will improve Outcome: Progressing Goal: Respiratory complications will improve Outcome: Progressing Goal: Cardiovascular complication will be avoided Outcome: Progressing   Problem: Activity: Goal: Risk for activity intolerance will decrease Outcome: Progressing   Problem: Nutrition: Goal: Adequate nutrition will be maintained Outcome: Progressing   Problem: Coping: Goal: Level of anxiety will decrease Outcome: Progressing   Problem: Elimination: Goal: Will not experience complications related to bowel motility Outcome: Progressing Goal: Will not experience complications related to urinary retention Outcome: Progressing   Problem: Pain Managment: Goal: General experience of comfort will improve Outcome: Progressing   Problem: Safety: Goal: Ability to remain free from injury will improve Outcome: Progressing   Problem: Skin Integrity: Goal: Risk for impaired skin integrity will decrease Outcome: Progressing   Problem: Education: Goal: Ability to demonstrate appropriate child care will improve Outcome: Progressing Goal: Ability to verbalize an understanding of newborn treatment and procedures will improve Outcome: Progressing Goal: Ability to demonstrate an understanding of appropriate nutrition and feeding will improve Outcome: Progressing Goal: Individualized Educational Video(s) Outcome: Progressing    Problem: Nutritional: Goal: Nutritional status of the infant will improve as evidenced by minimal weight loss and appropriate weight gain for gestational age Outcome: Progressing Goal: Ability to maintain a balanced intake and output will improve Outcome: Progressing   Problem: Clinical Measurements: Goal: Ability to maintain clinical measurements within normal limits will improve Outcome: Progressing   Problem: Skin Integrity: Goal: Risk for impaired skin integrity will decrease Outcome: Progressing Goal: Demonstrates signs of wound healing without infection Outcome: Progressing

## 2022-05-27 NOTE — Progress Notes (Signed)
Pharmacy Antibiotic Note  Chase Chase is a 88 y.o. male admitted on 05/25/2022 with UTI.  Pharmacy has been consulted for Unasyn dosing.  Plan: Unasyn 3gm IV q12hrs  Height: '5\' 7"'$  (170.2 cm) Weight: 74 kg (163 lb 2.3 oz) IBW/kg (Calculated) : 66.1  Temp (24hrs), Avg:97.9 F (36.6 C), Min:97.3 F (36.3 C), Max:98.6 F (37 C)  Recent Labs  Lab 05/25/22 1632 05/26/22 0403 05/27/22 0617  WBC 13.1* 19.4* 17.3*  CREATININE 1.40* 1.08 0.99    Estimated Creatinine Clearance: 39.9 mL/min (by C-G formula based on SCr of 0.99 mg/dL).    No Known Allergies  Antimicrobials this admission: Unasyn 6/17 >>  Doxycycline 6/16  Rocephin 6/16  Dose adjustments this admission:   Microbiology results:  BCx: pending 6/15: UCx: enterococcus Faecalis   Sputum:    MRSA PCR:   Thank you for allowing pharmacy to be a part of this patient's care.  Hart Robinsons A 05/27/2022 10:00 AM

## 2022-05-28 DIAGNOSIS — K858 Other acute pancreatitis without necrosis or infection: Secondary | ICD-10-CM

## 2022-05-28 DIAGNOSIS — K8500 Idiopathic acute pancreatitis without necrosis or infection: Secondary | ICD-10-CM

## 2022-05-28 LAB — CBC
HCT: 42.3 % (ref 39.0–52.0)
Hemoglobin: 14.2 g/dL (ref 13.0–17.0)
MCH: 34.1 pg — ABNORMAL HIGH (ref 26.0–34.0)
MCHC: 33.6 g/dL (ref 30.0–36.0)
MCV: 101.7 fL — ABNORMAL HIGH (ref 80.0–100.0)
Platelets: 140 10*3/uL — ABNORMAL LOW (ref 150–400)
RBC: 4.16 MIL/uL — ABNORMAL LOW (ref 4.22–5.81)
RDW: 12.3 % (ref 11.5–15.5)
WBC: 12.8 10*3/uL — ABNORMAL HIGH (ref 4.0–10.5)
nRBC: 0 % (ref 0.0–0.2)

## 2022-05-28 LAB — COMPREHENSIVE METABOLIC PANEL
ALT: 117 U/L — ABNORMAL HIGH (ref 0–44)
AST: 48 U/L — ABNORMAL HIGH (ref 15–41)
Albumin: 2.6 g/dL — ABNORMAL LOW (ref 3.5–5.0)
Alkaline Phosphatase: 124 U/L (ref 38–126)
Anion gap: 5 (ref 5–15)
BUN: 24 mg/dL — ABNORMAL HIGH (ref 8–23)
CO2: 25 mmol/L (ref 22–32)
Calcium: 7.9 mg/dL — ABNORMAL LOW (ref 8.9–10.3)
Chloride: 109 mmol/L (ref 98–111)
Creatinine, Ser: 0.88 mg/dL (ref 0.61–1.24)
GFR, Estimated: >60 mL/min (ref >60)
Glucose, Bld: 79 mg/dL (ref 70–99)
Potassium: 3.3 mmol/L — ABNORMAL LOW (ref 3.5–5.1)
Sodium: 139 mmol/L (ref 135–145)
Total Bilirubin: 1.5 mg/dL — ABNORMAL HIGH (ref 0.3–1.2)
Total Protein: 5.2 g/dL — ABNORMAL LOW (ref 6.5–8.1)

## 2022-05-28 LAB — MAGNESIUM: Magnesium: 1.8 mg/dL (ref 1.7–2.4)

## 2022-05-28 MED ORDER — ONDANSETRON HCL 4 MG PO TABS: 4.0000 mg | ORAL_TABLET | Freq: Every day | ORAL | 1 refills | Status: DC | PRN

## 2022-05-28 MED ORDER — POTASSIUM CHLORIDE CRYS ER 20 MEQ PO TBCR
40.0000 meq | EXTENDED_RELEASE_TABLET | Freq: Once | ORAL | Status: AC
  Administered 2022-05-28: 40 meq via ORAL
  Filled 2022-05-28: qty 2

## 2022-05-28 MED ORDER — AMOXICILLIN-POT CLAVULANATE 875-125 MG PO TABS: 1.0000 | ORAL_TABLET | Freq: Two times a day (BID) | ORAL | 0 refills | Status: AC

## 2022-05-28 NOTE — Discharge Summary (Signed)
Physician Discharge Summary  Chase Chase GHW:[id number] DOB: 07-Jul-1922 DOA: 05/25/2022  PCP: Sharilyn Sites, MD  Admit date: 05/25/2022  Discharge date: 05/28/2022  Admitted From:Home  Disposition:  Home  Recommendations for Outpatient Follow-up:  Follow up with PCP in 1-2 weeks Follow-up with gastroenterology in the next 2-3 months recommended for pancreatic CT protocol Remain on Augmentin as prescribed for enterococcal UTI as well as pneumonia treatment Zofran prescribed for any nausea or vomiting Remain on other home medications as prior and avoid NSAIDs  Home Health: Yes with PT  Equipment/Devices: None  Discharge Condition:Stable  CODE STATUS: Full  Diet recommendation: Heart Healthy  Brief/Interim Summary: Chase Chase is a 88 y.o. male with medical history significant for complete heart block, pacemaker, hard of hearing. Patient presented to the ED with complaints of abdominal pain back pain and vomiting.  He had been admitted with acute pancreatitis and was also noted to have pneumonia and UTI and started on IV antibiotics.  His pancreatitis slowly improved and he was seen by GI with no other significant findings noted on his lipid panel or imaging studies.  He is now tolerating diet.  He was eventually noted to have enterococcal UTI and was transitioned to IV Unasyn and will be transition to Augmentin on discharge to complete course of treatment for not only UTI, but also pneumonia.  He is in stable condition for discharge today with no other acute events noted.  Discharge Diagnoses:  Principal Problem:   Acute pancreatitis Active Problems:   Lower urinary tract infectious disease   Pneumonia   CHB (complete heart block) [facility])   Pacemaker  Principal discharge diagnosis: Acute pancreatitis.  Community-acquired pneumonia and enterococcal UTI.  Prerenal AKI in the setting of pancreatitis.  Discharge Instructions  Discharge Instructions     Diet - low sodium heart  healthy   Complete by: As directed    Increase activity slowly   Complete by: As directed       Allergies as of 05/28/2022   No Known Allergies      Medication List     STOP taking these medications    ciprofloxacin 500 MG tablet Commonly known as: CIPRO   naproxen sodium 220 MG tablet Commonly known as: ALEVE       TAKE these medications    alfuzosin 10 MG 24 hr tablet Commonly known as: UROXATRAL Take 1 tablet (10 mg total) by mouth at bedtime.   amoxicillin-clavulanate 875-125 MG tablet Commonly known as: AUGMENTIN Take 1 tablet by mouth every 12 (twelve) hours for 3 days.   Fish Oil 645 MG Caps Take 1,290 mg by mouth daily. Take 2 capsules daily for cholesterol.   latanoprost 0.005 % ophthalmic solution Commonly known as: XALATAN Place 1 drop into both eyes at bedtime as needed.   levothyroxine 50 MCG tablet Commonly known as: SYNTHROID Take 50 mcg by mouth daily.   meclizine 25 MG tablet Commonly known as: ANTIVERT Take 25 mg by mouth as needed for dizziness (rarely needs).   ondansetron 4 MG tablet Commonly known as: Zofran Take 1 tablet (4 mg total) by mouth daily as needed for nausea or vomiting.   PROSTATE HEALTH PO Take 2 tablets by mouth daily.   Vitamin D (Ergocalciferol) 1.25 MG (50000 UNIT) Caps capsule Commonly known as: DRISDOL Take 1 capsule by mouth every 30 (thirty) days.   zinc gluconate 50 MG tablet Take 50 mg by mouth daily.        Follow-up Information  Sharilyn Sites, MD. Schedule an appointment as soon as possible for a visit in 1 week(s).   Specialty: Family Medicine Contact information: [address] [place] [address] [phone]         [place]. Schedule an appointment as soon as possible for a visit in 3 month(s).   Contact information: [address] [place] [phone]               No Known  Allergies  Consultations: GI   Procedures/Studies: CT Renal Stone Study  Result Date: 05/25/2022 CLINICAL DATA:  Abdominal and back pain. CT abdomen and pelvis 04/03/2022. EXAM: CT ABDOMEN AND PELVIS WITHOUT CONTRAST TECHNIQUE: Multidetector CT imaging of the abdomen and pelvis was performed following the standard protocol without IV contrast. RADIATION DOSE REDUCTION: This exam was performed according to the departmental dose-optimization program which includes automated exposure control, adjustment of the mA and/or kV according to patient size and/or use of iterative reconstruction technique. COMPARISON:  CT abdomen and pelvis 04/03/2022. FINDINGS: Lower chest: There is a stable small left pleural effusion with compressive atelectasis of the left lower lobe. There is some new patchy airspace disease in the right lower lobe. Para-aortic soft tissue density in the lower chest is unchanged measuring 4.4 x 5.1 cm. Hepatobiliary: No focal liver abnormality is seen. Status post cholecystectomy. No biliary dilatation. Pancreas: There is mild inflammatory stranding surrounding the pancreas. No ductal dilatation or fluid collection identified. Spleen: Normal in size without focal abnormality. Adrenals/Urinary Tract: The bladder is decompressed and not well evaluated. Small left bladder diverticulum again seen. There is stable mild left-sided hydronephrosis. Again seen are 2 calculi within the left renal pelvis measuring 1 cm and 9 mm, unchanged. There are additional bilateral nonobstructing renal calculi also unchanged. Bilateral renal cysts are similar to the prior study. Hyperdense cyst in the right mid kidney appears unchanged. Hyperdense cyst in the inferior pole the right kidney appears unchanged. Adrenal glands are within normal limits. Stomach/Bowel: Stomach is within normal limits. Appendix appears normal. No evidence of bowel wall thickening, distention, or inflammatory changes. There is diffuse colonic  diverticulosis. Vascular/Lymphatic: Aortic atherosclerosis. No enlarged abdominal or pelvic lymph nodes. Reproductive: Prostate gland is enlarged, unchanged. Other: Fat containing right inguinal hernia is again seen. There is no ascites. Musculoskeletal: Degenerative changes affect the spine. IMPRESSION: 1. Findings compatible with acute uncomplicated pancreatitis. 2. Stable mild left-sided hydronephrosis secondary to 2 calculi within the left renal pelvis measuring up to 1 cm. 3. Unchanged lower thoracic para-aortic soft tissue mass, indeterminate. This can be further evaluated with chest CT. 4. Minimal new patchy airspace disease in the right lower lobe. Stable small left pleural effusion. 5. Stable right renal calculi without hydronephrosis. 6. Colonic diverticulosis. 7. Prostatomegaly. 8.  Aortic Atherosclerosis (ICD10-I70.0). Electronically Signed   By: Ronney Asters M.D.   On: 05/25/2022 17:12     Discharge Exam: Vitals:   05/27/22 2107 05/28/22 0454  BP: 131/63 (!) 123/58  Pulse: (!) 46 61  Resp: 20 20  Temp: 98.1 F (36.7 C) 98.7 F (37.1 C)  SpO2: 95% 94%   Vitals:   05/27/22 0617 05/27/22 1420 05/27/22 2107 05/28/22 0454  BP: (!) 146/64 123/61 131/63 (!) 123/58  Pulse: 64 60 (!) 46 61  Resp: '20 20 20 20  '$ Temp: 97.8 F (36.6 C) 98.5 F (36.9 C) 98.1 F (36.7 C) 98.7 F (37.1 C)  TempSrc:   Oral Oral  SpO2: 98% 98% 95% 94%  Weight:  Height:        General: Pt is alert, awake, not in acute distress Cardiovascular: RRR, S1/S2 +, no rubs, no gallops Respiratory: CTA bilaterally, no wheezing, no rhonchi Abdominal: Soft, NT, ND, bowel sounds + Extremities: no edema, no cyanosis    The results of significant diagnostics from this hospitalization (including imaging, microbiology, ancillary and laboratory) are listed below for reference.     Microbiology: Recent Results (from the past 240 hour(s))  Urine Culture     Status: Abnormal   Collection Time: 05/25/22  4:49 PM    Specimen: Urine, Clean Catch  Result Value Ref Range Status   Specimen Description   Final    URINE, CLEAN CATCH Performed at [facility], [address], [place], [place] [address]    Special Requests   Final    NONE Performed at [facility], [address], [place], [place] [address]    Culture 60,000 COLONIES/mL ENTEROCOCCUS FAECALIS (A)  Final   Report Status 05/27/2022 FINAL  Final   Organism ID, Bacteria ENTEROCOCCUS FAECALIS (A)  Final      Susceptibility   Enterococcus faecalis - MIC*    AMPICILLIN <=2 SENSITIVE Sensitive     NITROFURANTOIN <=16 SENSITIVE Sensitive     VANCOMYCIN 1 SENSITIVE Sensitive     * 60,000 COLONIES/mL ENTEROCOCCUS FAECALIS     Labs: BNP (last 3 results) Recent Labs    09/06/21 1355  BNP 98.9   Basic Metabolic Panel: Recent Labs  Lab 05/25/22 1632 05/26/22 0403 05/27/22 0617 05/28/22 0506  NA 139 142 140 139  K 4.1 4.3 3.6 3.3*  CL 102 105 107 109  CO2 '27 24 26 25  '$ GLUCOSE 194* 125* 84 79  BUN 25* 26* 26* 24*  CREATININE 1.40* 1.08 0.99 0.88  CALCIUM 9.5 9.2 8.3* 7.9*  MG 1.9  --  1.8 1.8   Liver Function Tests: Recent Labs  Lab 05/26/22 0403 05/27/22 0617 05/28/22 0506  AST 16 114* 48*  ALT 14 198* 117*  ALKPHOS 41 174* 124  BILITOT 1.0 1.3* 1.5*  PROT 5.9* 5.9* 5.2*  ALBUMIN 3.3* 3.1* 2.6*   Recent Labs  Lab 05/25/22 1632 05/27/22 0617  LIPASE 6,125* 53*   No results for input(s): "AMMONIA" in the last 168 hours. CBC: Recent Labs  Lab 05/25/22 1632 05/26/22 0403 05/27/22 0617 05/28/22 0506  WBC 13.1* 19.4* 17.3* 12.8*  NEUTROABS 11.9*  --   --   --   HGB 17.1* 16.7 15.4 14.2  HCT 49.8 50.2 46.3 42.3  MCV 100.4* 102.7* 102.7* 101.7*  PLT 190 174 155 140*   Cardiac Enzymes: No results for input(s): "CKTOTAL", "CKMB", "CKMBINDEX", "TROPONINI" in the last 168 hours. BNP: Invalid input(s): "POCBNP" CBG: No results for input(s): "GLUCAP" in the last 168 hours. D-Dimer No results for input(s): "DDIMER"  in the last 72 hours. Hgb A1c No results for input(s): "HGBA1C" in the last 72 hours. Lipid Profile Recent Labs    05/26/22 0403  CHOL 207*  HDL 49  LDLCALC 140*  TRIG 89  CHOLHDL 4.2   Thyroid function studies No results for input(s): "TSH", "T4TOTAL", "T3FREE", "THYROIDAB" in the last 72 hours.  Invalid input(s): "FREET3" Anemia work up No results for input(s): "VITAMINB12", "FOLATE", "FERRITIN", "TIBC", "IRON", "RETICCTPCT" in the last 72 hours. Urinalysis    Component Value Date/Time   COLORURINE AMBER (A) 05/25/2022 1649   APPEARANCEUR HAZY (A) 05/25/2022 1649   APPEARANCEUR Cloudy (A) 04/17/2022 1202   LABSPEC 1.011 05/25/2022 1649  PHURINE 5.0 05/25/2022 1649   GLUCOSEU NEGATIVE 05/25/2022 1649   HGBUR MODERATE (A) 05/25/2022 1649   BILIRUBINUR NEGATIVE 05/25/2022 1649   BILIRUBINUR Negative 04/17/2022 1202   KETONESUR NEGATIVE 05/25/2022 1649   PROTEINUR 30 (A) 05/25/2022 1649   UROBILINOGEN 0.2 04/08/2013 1815   NITRITE NEGATIVE 05/25/2022 1649   LEUKOCYTESUR LARGE (A) 05/25/2022 1649   Sepsis Labs Recent Labs  Lab 05/25/22 1632 05/26/22 0403 05/27/22 0617 05/28/22 0506  WBC 13.1* 19.4* 17.3* 12.8*   Microbiology Recent Results (from the past 240 hour(s))  Urine Culture     Status: Abnormal   Collection Time: 05/25/22  4:49 PM   Specimen: Urine, Clean Catch  Result Value Ref Range Status   Specimen Description   Final    URINE, CLEAN CATCH Performed at [facility], [address], [place], [place] [address]    Special Requests   Final    NONE Performed at [facility], [address] [place], [place] [address]    Culture 60,000 COLONIES/mL ENTEROCOCCUS FAECALIS (A)  Final   Report Status 05/27/2022 FINAL  Final   Organism ID, Bacteria ENTEROCOCCUS FAECALIS (A)  Final      Susceptibility   Enterococcus faecalis - MIC*    AMPICILLIN <=2 SENSITIVE Sensitive     NITROFURANTOIN <=16 SENSITIVE Sensitive     VANCOMYCIN 1 SENSITIVE Sensitive     *  60,000 COLONIES/mL ENTEROCOCCUS FAECALIS     Time coordinating discharge: 35 minutes  SIGNED:   Rodena Goldmann, DO [facility] 05/28/2022, 9:33 AM  If 7PM-7AM, please contact night-coverage [email or web address]

## 2022-05-28 NOTE — TOC Progression Note (Signed)
Transition of Care [facility]) - Progression Note    Patient Details  Name: Chase Chase MRN: [id number] Date of Birth: 12-Feb-1922  Transition of Care [facility]) CM/SW [place], LCSW Phone Number: 05/28/2022, 11:00am  Clinical Narrative:     CSW spoke with pt's daughter Herschel [place] about [facility] services, she stated pt did not have active services and has no preference of agency. CSW will send referrals out to [facility] agency to see who can accept. CSW informed pt's daughter the agency that accept pt will contact her within 24 to 48 hrs after pt d/c.    Pt was accepted for HHPT and OT with [facility].     Expected Discharge Plan: [place] Barriers to Discharge: Continued Medical Work up  Expected Discharge Plan and Services Expected Discharge Plan: [place] arrangements for the past 2 months: Single Family Home Expected Discharge Date: 05/28/22                                     Social Determinants of Health (SDOH) Interventions    Readmission Risk Interventions     No data to display

## 2022-05-28 NOTE — Progress Notes (Signed)
Tolerating regular diet without any abdominal pain, nausea or vomiting.  Vital signs in last 24 hours: Temp:  [98.1 F (36.7 C)-98.7 F (37.1 C)] 98.7 F (37.1 C) (06/18 0454) Pulse Rate:  [46-61] 61 (06/18 0454) Resp:  [20] 20 (06/18 0454) BP: (123-131)/(58-63) 123/58 (06/18 0454) SpO2:  [94 %-98 %] 94 % (06/18 0454) Last BM Date : 05/26/22 General:   Alert,  pleasant and cooperative in NAD; he is accompanied by his daughter Herschel [place] Abdomen: Nondistended.  Positive bowel sounds soft and nontender without appreciable mass organomegaly  extremities:  Without clubbing or edema.    Intake/Output from previous day: 06/17 0701 - 06/18 0700 In: 400 [I.V.:300; IV Piggyback:100] Out: 250 [Urine:250] Intake/Output this shift: Total I/O In: 240 [P.O.:240] Out: 0   Lab Results: Recent Labs    05/26/22 0403 05/27/22 0617 05/28/22 0506  WBC 19.4* 17.3* 12.8*  HGB 16.7 15.4 14.2  HCT 50.2 46.3 42.3  PLT 174 155 140*   BMET Recent Labs    05/26/22 0403 05/27/22 0617 05/28/22 0506  NA 142 140 139  K 4.3 3.6 3.3*  CL 105 107 109  CO2 '24 26 25  '$ GLUCOSE 125* 84 79  BUN 26* 26* 24*  CREATININE 1.08 0.99 0.88  CALCIUM 9.2 8.3* 7.9*   LFT Recent Labs    05/26/22 0403 05/27/22 0617 05/28/22 0506  PROT 5.9*   < > 5.2*  ALBUMIN 3.3*   < > 2.6*  AST 16   < > 48*  ALT 14   < > 117*  ALKPHOS 41   < > 124  BILITOT 1.0   < > 1.5*  BILIDIR <0.1  --   --   IBILI NOT CALCULATED  --   --    < > = values in this interval not displayed.    Impression:  Pleasant 88 year old gentleman admitted to the hospital with relatively mild, uncomplicated pancreatitis. Clinically, much improved at this time.  He is afebrile.  White count is normalizing. Abdominal pain resolved.  Tolerating a diet.  I am suspicious that he may have recently experienced transient extrahepatic biliary obstruction producing his pancreatitis.  Notable bump in his transaminases yesterday which have  significantly improved today.  Slightly higher total bilirubin today consistent with the "lag" phenomenon we see in this type of scenario.  This pattern of LFT abnormalities makes pancreatitis due to occult tumor or drug-induced etiology less likely.  Recommendations:  From a GI standpoint, I feel he could be discharged home today  He will need a repeat hepatic function profile 6/20.  Follow-up in my office in 3 to 4 weeks.  Pancreatic protocol CT in 3 months  I have discussed my impression and treatment recommendations with the patient and his daughter Herschel [place] at the bedside some length this morning.

## 2022-05-28 NOTE — Progress Notes (Signed)
Physical Therapy Treatment Patient Details Name: Chase Chase MRN: [id number] DOB: 06/07/1922 Today's Date: 05/28/2022   History of Present Illness Chase Chase is a 88 y.o. male with medical history significant for complete heart block, pacemaker, hard of hearing.  Patient presented to the ED with complaints of abdominal pain back pain and vomiting.  Patient was seen by his outpatient provider today was diagnosed with a UTI and given a shot of Rocephin to continue with oral ciprofloxacin .  Patient has been having abdominal pain over the past few days.  When he got home from his PCPs office, the abdominal pain significantly worsened and he started vomiting.  No blood in vomitus.  Reports a mild cough.  No difficulty breathing.  No diarrhea.    PT Comments    Patient demonstrates slow labored movement for sitting up at bedside with posterior leaning that resolved after sitting up for a few minutes, then demonstrated fair/good return for completing BLE ROM/strengthening exercises with verbal cueing and demonstration.  Patient able to ambulate in hallway with less assistance, only required Min guard assist for making turns, once fatigued had to lean on RW for more support with increased flexed trunk.  Patient tolerated sitting up in chair after therapy with his daughter present in room.  Patient will benefit from continued skilled physical therapy in hospital and recommended venue below to increase strength, balance, endurance for safe ADLs and gait.        Recommendations for follow up therapy are one component of a multi-disciplinary discharge planning process, led by the attending physician.  Recommendations may be updated based on patient status, additional functional criteria and insurance authorization.  Follow Up Recommendations  Skilled nursing-short term rehab (<3 hours/day)     Assistance Recommended at Discharge Intermittent Supervision/Assistance  Patient can return home with the  following A little help with walking and/or transfers;A little help with bathing/dressing/bathroom;Assistance with cooking/housework;Assist for transportation;Help with stairs or ramp for entrance   Equipment Recommendations  None recommended by PT    Recommendations for Other Services       Precautions / Restrictions Precautions Precautions: Fall Restrictions Weight Bearing Restrictions: No     Mobility  Bed Mobility Overal bed mobility: Needs Assistance Bed Mobility: Supine to Sit     Supine to sit: Min guard, Min assist     General bed mobility comments: slow labored movement with mild diffiuclty maintaining sitting balance due to posterior leaning that resolved after 2-3 minutes    Transfers Overall transfer level: Needs assistance Equipment used: Rolling walker (2 wheels) Transfers: Sit to/from Stand, Bed to chair/wheelchair/BSC Sit to Stand: Min guard, Min assist   Step pivot transfers: Min guard       General transfer comment: labored movement, increased time    Ambulation/Gait Ambulation/Gait assistance: Min guard Gait Distance (Feet): 100 Feet Assistive device: Rolling walker (2 wheels) Gait Pattern/deviations: Decreased step length - left, Decreased stance time - right, Decreased stride length, Trunk flexed Gait velocity: decreased     General Gait Details: slow slightly labored cadence requiring increased time and Min guard assist for making turns, no loss of balance and limited mostly due to fatigue   Stairs             Wheelchair Mobility    Modified Rankin (Stroke Patients Only)       Balance Overall balance assessment: Needs assistance Sitting-balance support: Feet supported, No upper extremity supported Sitting balance-Leahy Scale: Fair Sitting balance - Comments: fair/good seated  at EOB   Standing balance support: During functional activity, Bilateral upper extremity supported Standing balance-Leahy Scale: Fair Standing  balance comment: using RW                            Cognition Arousal/Alertness: Awake/alert Behavior During Therapy: WFL for tasks assessed/performed Overall Cognitive Status: Within Functional Limits for tasks assessed                                          Exercises General Exercises - Lower Extremity Long Arc Quad: Seated, AROM, Strengthening, Both, 10 reps Hip Flexion/Marching: Seated, AROM, Strengthening, Both, 10 reps Toe Raises: Seated, AROM, Strengthening, Both, 10 reps Heel Raises: Seated, AROM, Strengthening, Both, 10 reps    General Comments        Pertinent Vitals/Pain Pain Assessment Pain Assessment: No/denies pain    Home Living                          Prior Function            PT Goals (current goals can now be found in the care plan section) Acute Rehab PT Goals Patient Stated Goal: return home PT Goal Formulation: With patient/family Time For Goal Achievement: 06/09/22 Potential to Achieve Goals: Good Progress towards PT goals: Progressing toward goals    Frequency    Min 3X/week      PT Plan Current plan remains appropriate    Co-evaluation              AM-PAC PT "6 Clicks" Mobility   Outcome Measure  Help needed turning from your back to your side while in a flat bed without using bedrails?: A Little Help needed moving from lying on your back to sitting on the side of a flat bed without using bedrails?: A Little Help needed moving to and from a bed to a chair (including a wheelchair)?: A Little Help needed standing up from a chair using your arms (e.g., wheelchair or bedside chair)?: A Little Help needed to walk in hospital room?: A Little Help needed climbing 3-5 steps with a railing? : A Lot 6 Click Score: 17    End of Session   Activity Tolerance: Patient tolerated treatment well;Patient limited by fatigue Patient left: in chair;with call bell/phone within reach;with family/visitor  present Nurse Communication: Mobility status PT Visit Diagnosis: Unsteadiness on feet (R26.81);Other abnormalities of gait and mobility (R26.89);Muscle weakness (generalized) (M62.81)     Time: 9937-1696 PT Time Calculation (min) (ACUTE ONLY): 29 min  Charges:  $Gait Training: 8-22 mins $Therapeutic Exercise: 8-22 mins                     11:37 AM, 05/28/22 Lonell Grandchild, MPT Physical Therapist with [facility] [phone] office [phone] mobile phone

## 2022-05-28 NOTE — Progress Notes (Signed)
IV removed and discharge instructions reviewed with daughter.  Scripts sent to pharmacy and daughter to drive home.

## 2022-05-28 NOTE — Progress Notes (Signed)
Patient repositioned this morning. During the night the patient slept most of the shift only waking when this nurse entered the room to give medications.

## 2022-05-29 ENCOUNTER — Encounter: Payer: Self-pay | Admitting: Internal Medicine

## 2022-05-30 ENCOUNTER — Telehealth: Payer: Self-pay

## 2022-05-30 DIAGNOSIS — I442 Atrioventricular block, complete: Secondary | ICD-10-CM | POA: Diagnosis not present

## 2022-05-30 DIAGNOSIS — K859 Acute pancreatitis without necrosis or infection, unspecified: Secondary | ICD-10-CM | POA: Diagnosis not present

## 2022-05-30 DIAGNOSIS — Z792 Long term (current) use of antibiotics: Secondary | ICD-10-CM | POA: Diagnosis not present

## 2022-05-30 DIAGNOSIS — N390 Urinary tract infection, site not specified: Secondary | ICD-10-CM | POA: Diagnosis not present

## 2022-05-30 DIAGNOSIS — R296 Repeated falls: Secondary | ICD-10-CM | POA: Diagnosis not present

## 2022-05-30 DIAGNOSIS — Z950 Presence of cardiac pacemaker: Secondary | ICD-10-CM | POA: Diagnosis not present

## 2022-05-30 DIAGNOSIS — J189 Pneumonia, unspecified organism: Secondary | ICD-10-CM | POA: Diagnosis not present

## 2022-05-30 NOTE — Telephone Encounter (Signed)
-----   Message from Daneil Dolin, MD sent at 05/28/2022 11:17 AM EDT ----- New patient from hospitalization here for pancreatitis.  Needs office visit with app in about 4 weeks  Need to look for a hepatic function profile from 6/20 as an outpatient.  Hospitalist is supposed to order.  Need to see those numbers.  If it does not turn up by 6/22 we need to reach out to the patient and make sure that gets done.  Also, needs a pancreatic protocol CT scan in 3 months; indication is "idiopathic pancreatitis"

## 2022-05-31 DIAGNOSIS — E663 Overweight: Secondary | ICD-10-CM | POA: Diagnosis not present

## 2022-05-31 DIAGNOSIS — Z1331 Encounter for screening for depression: Secondary | ICD-10-CM | POA: Diagnosis not present

## 2022-05-31 DIAGNOSIS — I442 Atrioventricular block, complete: Secondary | ICD-10-CM | POA: Diagnosis not present

## 2022-05-31 DIAGNOSIS — Z0001 Encounter for general adult medical examination with abnormal findings: Secondary | ICD-10-CM | POA: Diagnosis not present

## 2022-05-31 DIAGNOSIS — E039 Hypothyroidism, unspecified: Secondary | ICD-10-CM | POA: Diagnosis not present

## 2022-05-31 DIAGNOSIS — K859 Acute pancreatitis without necrosis or infection, unspecified: Secondary | ICD-10-CM | POA: Diagnosis not present

## 2022-05-31 DIAGNOSIS — J189 Pneumonia, unspecified organism: Secondary | ICD-10-CM | POA: Diagnosis not present

## 2022-05-31 DIAGNOSIS — Z6826 Body mass index (BMI) 26.0-26.9, adult: Secondary | ICD-10-CM | POA: Diagnosis not present

## 2022-06-01 DIAGNOSIS — R296 Repeated falls: Secondary | ICD-10-CM | POA: Diagnosis not present

## 2022-06-01 DIAGNOSIS — K859 Acute pancreatitis without necrosis or infection, unspecified: Secondary | ICD-10-CM | POA: Diagnosis not present

## 2022-06-01 DIAGNOSIS — J189 Pneumonia, unspecified organism: Secondary | ICD-10-CM | POA: Diagnosis not present

## 2022-06-01 DIAGNOSIS — Z792 Long term (current) use of antibiotics: Secondary | ICD-10-CM | POA: Diagnosis not present

## 2022-06-01 DIAGNOSIS — N390 Urinary tract infection, site not specified: Secondary | ICD-10-CM | POA: Diagnosis not present

## 2022-06-01 DIAGNOSIS — I442 Atrioventricular block, complete: Secondary | ICD-10-CM | POA: Diagnosis not present

## 2022-06-01 NOTE — Telephone Encounter (Signed)
Pt is scheduled for 7/26 at 0900 with Venetia Night, NP and appt card has been mailed

## 2022-06-01 NOTE — Telephone Encounter (Signed)
Spoke with patient's daughter and she states that the patient was seen by Dr. Hilma Favors and he stated that it was too soon for the patient to have labs done. Dr. Hilma Favors ordered labs for the patient to have done next week. Contacted Dr. Delanna Ahmadi office and the labs that were ordered were cbc, cmet, tsh, and lipase, but no hepatic function profile labs are to be done at [place] to the front to get patient an appt within four weeks as requested, as well as to nic ct scan in 3 months. Daughter seems to be a bit apprehensive on taking the patient to the lab, stated that she was going to reach out to Dr. Hilma Favors to see if home health could come out to the patient's home to draw the labs.

## 2022-06-05 DIAGNOSIS — I442 Atrioventricular block, complete: Secondary | ICD-10-CM | POA: Diagnosis not present

## 2022-06-05 DIAGNOSIS — J189 Pneumonia, unspecified organism: Secondary | ICD-10-CM | POA: Diagnosis not present

## 2022-06-05 DIAGNOSIS — N390 Urinary tract infection, site not specified: Secondary | ICD-10-CM | POA: Diagnosis not present

## 2022-06-05 DIAGNOSIS — R296 Repeated falls: Secondary | ICD-10-CM | POA: Diagnosis not present

## 2022-06-05 DIAGNOSIS — K859 Acute pancreatitis without necrosis or infection, unspecified: Secondary | ICD-10-CM | POA: Diagnosis not present

## 2022-06-05 DIAGNOSIS — Z792 Long term (current) use of antibiotics: Secondary | ICD-10-CM | POA: Diagnosis not present

## 2022-06-07 DIAGNOSIS — Z792 Long term (current) use of antibiotics: Secondary | ICD-10-CM | POA: Diagnosis not present

## 2022-06-07 DIAGNOSIS — K859 Acute pancreatitis without necrosis or infection, unspecified: Secondary | ICD-10-CM | POA: Diagnosis not present

## 2022-06-07 DIAGNOSIS — J189 Pneumonia, unspecified organism: Secondary | ICD-10-CM | POA: Diagnosis not present

## 2022-06-07 DIAGNOSIS — R296 Repeated falls: Secondary | ICD-10-CM | POA: Diagnosis not present

## 2022-06-07 DIAGNOSIS — I442 Atrioventricular block, complete: Secondary | ICD-10-CM | POA: Diagnosis not present

## 2022-06-07 DIAGNOSIS — N390 Urinary tract infection, site not specified: Secondary | ICD-10-CM | POA: Diagnosis not present

## 2022-06-08 DIAGNOSIS — Z6826 Body mass index (BMI) 26.0-26.9, adult: Secondary | ICD-10-CM | POA: Diagnosis not present

## 2022-06-08 DIAGNOSIS — E039 Hypothyroidism, unspecified: Secondary | ICD-10-CM | POA: Diagnosis not present

## 2022-06-08 DIAGNOSIS — K859 Acute pancreatitis without necrosis or infection, unspecified: Secondary | ICD-10-CM | POA: Diagnosis not present

## 2022-06-12 DIAGNOSIS — N390 Urinary tract infection, site not specified: Secondary | ICD-10-CM | POA: Diagnosis not present

## 2022-06-12 DIAGNOSIS — R296 Repeated falls: Secondary | ICD-10-CM | POA: Diagnosis not present

## 2022-06-12 DIAGNOSIS — I442 Atrioventricular block, complete: Secondary | ICD-10-CM | POA: Diagnosis not present

## 2022-06-12 DIAGNOSIS — Z792 Long term (current) use of antibiotics: Secondary | ICD-10-CM | POA: Diagnosis not present

## 2022-06-12 DIAGNOSIS — K859 Acute pancreatitis without necrosis or infection, unspecified: Secondary | ICD-10-CM | POA: Diagnosis not present

## 2022-06-12 DIAGNOSIS — J189 Pneumonia, unspecified organism: Secondary | ICD-10-CM | POA: Diagnosis not present

## 2022-06-14 DIAGNOSIS — Z792 Long term (current) use of antibiotics: Secondary | ICD-10-CM | POA: Diagnosis not present

## 2022-06-14 DIAGNOSIS — N390 Urinary tract infection, site not specified: Secondary | ICD-10-CM | POA: Diagnosis not present

## 2022-06-14 DIAGNOSIS — J189 Pneumonia, unspecified organism: Secondary | ICD-10-CM | POA: Diagnosis not present

## 2022-06-14 DIAGNOSIS — K859 Acute pancreatitis without necrosis or infection, unspecified: Secondary | ICD-10-CM | POA: Diagnosis not present

## 2022-06-14 DIAGNOSIS — R296 Repeated falls: Secondary | ICD-10-CM | POA: Diagnosis not present

## 2022-06-14 DIAGNOSIS — I442 Atrioventricular block, complete: Secondary | ICD-10-CM | POA: Diagnosis not present

## 2022-06-21 DIAGNOSIS — N390 Urinary tract infection, site not specified: Secondary | ICD-10-CM | POA: Diagnosis not present

## 2022-06-21 DIAGNOSIS — K859 Acute pancreatitis without necrosis or infection, unspecified: Secondary | ICD-10-CM | POA: Diagnosis not present

## 2022-06-21 DIAGNOSIS — R296 Repeated falls: Secondary | ICD-10-CM | POA: Diagnosis not present

## 2022-06-21 DIAGNOSIS — Z792 Long term (current) use of antibiotics: Secondary | ICD-10-CM | POA: Diagnosis not present

## 2022-06-21 DIAGNOSIS — I442 Atrioventricular block, complete: Secondary | ICD-10-CM | POA: Diagnosis not present

## 2022-06-21 DIAGNOSIS — J189 Pneumonia, unspecified organism: Secondary | ICD-10-CM | POA: Diagnosis not present

## 2022-06-26 ENCOUNTER — Ambulatory Visit (INDEPENDENT_AMBULATORY_CARE_PROVIDER_SITE_OTHER): Payer: Medicare Other

## 2022-06-26 DIAGNOSIS — I442 Atrioventricular block, complete: Secondary | ICD-10-CM | POA: Diagnosis not present

## 2022-06-27 LAB — CUP PACEART REMOTE DEVICE CHECK
Date Time Interrogation Session: 20230717081647
Implantable Lead Implant Date: 20201009
Implantable Lead Implant Date: 20201009
Implantable Lead Location: 753859
Implantable Lead Location: 753860
Implantable Lead Model: 377
Implantable Lead Model: 377
Implantable Lead Serial Number: 81023057
Implantable Lead Serial Number: 81187700
Implantable Pulse Generator Implant Date: 20201009
Pulse Gen Model: 407145
Pulse Gen Serial Number: 69608254

## 2022-06-28 ENCOUNTER — Inpatient Hospital Stay: Payer: Medicare Other | Admitting: Gastroenterology

## 2022-06-28 DIAGNOSIS — L304 Erythema intertrigo: Secondary | ICD-10-CM | POA: Diagnosis not present

## 2022-07-04 NOTE — Progress Notes (Deleted)
GI Office Note    Referring Provider: Sharilyn Sites, MD Primary Care Physician:  Sharilyn Sites, MD  Primary GI: Dr. Gala Romney  Chief Complaint   No chief complaint on file.    History of Present Illness   Chase Chase is a 88 y.o. male presenting today at the request of Sharilyn Sites, MD for ***hospital follow up for pancreatitis.   Patient recently seen in the hospital 6/16 to 6/18 where he was admitted for abdominal pain and vomiting and has CT evidence of pancreatitis.  Patient has a history of cholecystectomy.  There was no biliary ductal dilation or pancreatic ductal dilation on CT scan.  Incidental finding of thoracic para- Aortic soft tissue mass.  His lipase was elevated to 6125, WBC 13.1, and UA with large leukocytes.  His lipid panel revealed normal triglycerides.  He denied any alcohol use and had quit smoking 40 years prior.  1 day prior to his admission he was diagnosed with a UTI and started on Rocephin and later on after he got home he began vomiting and having severe abdominal pain.  He did report some constipation with bowel movements every couple of days.  For the 24 hours prior to his admission there was difficulty with p.o. intake.  He denied any melena, hematochezia, unintentional weight loss, dysphagia, lack of appetite, early satiety.  He also denied any frequent NSAID use.  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  He was treated with IV fluids.  His med list was reviewed with no medications that would typically cause pancreatitis.  Although patient was only on antibiotics for 24 hours that remained in the differential.  Malignancy not ruled out.  He was advised to follow-up in the clinic in 4 weeks, repeat HFP on 6/20, and repeat CT chest, abdomen, and pelvis in 3 months with pancreatic protocol.  He was also advised to begin taking MiraLAX nightly.  Patient is wife requested that he  have lab work performed with his PCP in 1 week after his discharge.  No labs on file.  Today:    Past Medical History:  Diagnosis Date   At risk of UTI    CHB (complete heart block) ([place]) 09/2019   CHB (complete heart block) ([place]) 09/2019   Glaucoma    Glaucoma    HOH (hard of hearing)    Hypercholesterolemia    Pacemaker 09/19/2019   Biotronik dual chamber PPM   Prostate disease    unspecified   Vertigo     Past Surgical History:  Procedure Laterality Date   CHOLECYSTECTOMY     HERNIA REPAIR     bilateral   PACEMAKER IMPLANT N/A 09/19/2019   Procedure: PACEMAKER IMPLANT;  Surgeon: Evans Lance, MD;  Location: [facility];  Service: Cardiovascular;  Laterality: N/A;    Current Outpatient Medications  Medication Sig Dispense Refill   alfuzosin (UROXATRAL) 10 MG 24 hr tablet Take 1 tablet (10 mg total) by mouth at bedtime. (Patient not taking: Reported on 05/25/2022) 30 tablet 11   latanoprost (XALATAN) 0.005 % ophthalmic solution Place 1 drop into both eyes at bedtime as needed.      levothyroxine (SYNTHROID) 50 MCG tablet Take 50 mcg by mouth daily.     meclizine (ANTIVERT) 25 MG tablet Take 25 mg by mouth as needed for dizziness (rarely needs).     Misc Natural  Products (PROSTATE HEALTH PO) Take 2 tablets by mouth daily.     Omega-3 Fatty Acids (FISH OIL) 645 MG CAPS Take 1,290 mg by mouth daily. Take 2 capsules daily for cholesterol.     ondansetron (ZOFRAN) 4 MG tablet Take 1 tablet (4 mg total) by mouth daily as needed for nausea or vomiting. 30 tablet 1   Vitamin D, Ergocalciferol, (DRISDOL) 1.25 MG (50000 UT) CAPS capsule Take 1 capsule by mouth every 30 (thirty) days.     zinc gluconate 50 MG tablet Take 50 mg by mouth daily.     No current facility-administered medications for this visit.    Allergies as of 07/05/2022   (No Known Allergies)    Family History  Problem Relation Age of Onset   Breast cancer Sister    Lung cancer Brother    Lung  cancer Brother    Lung cancer Brother    Breast cancer Sister    Breast cancer Sister     Social History   Socioeconomic History   Marital status: Widowed    Spouse name: Not on file   Number of children: Not on file   Years of education: Not on file   Highest education level: Not on file  Occupational History   Occupation: retired  Tobacco Use   Smoking status: Former    Packs/day: 2.00    Years: 8.00    Total pack years: 16.00    Types: Cigarettes   Smokeless tobacco: Former    Types: Chew    Quit date: 10/12/2011   Tobacco comments:    Chewing and snuff  Vaping Use   Vaping Use: Never used  Substance and Sexual Activity   Alcohol use: No   Drug use: No   Sexual activity: Not on file  Other Topics Concern   Not on file  Social History Narrative   Not on file   Social Determinants of Health   Financial Resource Strain: Not on file  Food Insecurity: Not on file  Transportation Needs: Not on file  Physical Activity: Not on file  Stress: Not on file  Social Connections: Not on file  Intimate Partner Violence: Not on file     Review of Systems   Gen: Denies any fever, chills, fatigue, weight loss, lack of appetite.  CV: Denies chest pain, heart palpitations, peripheral edema, syncope.  Resp: Denies shortness of breath at rest or with exertion. Denies wheezing or cough.  GI: See HPI GU : Denies urinary burning, urinary frequency, urinary hesitancy MS: Denies joint pain, muscle weakness, cramps, or limitation of movement.  Derm: Denies rash, itching, dry skin Psych: Denies depression, anxiety, memory loss, and confusion Heme: Denies bruising, bleeding, and enlarged lymph nodes.   Physical Exam   There were no vitals taken for this visit.  General:   Alert and oriented. Pleasant and cooperative. Well-nourished and well-developed.  Head:  Normocephalic and atraumatic. Eyes:  Without icterus, sclera clear and conjunctiva pink.  Ears:  Normal auditory  acuity. Mouth:  No deformity or lesions, oral mucosa pink.  Lungs:  Clear to auscultation bilaterally. No wheezes, rales, or rhonchi. No distress.  Heart:  S1, S2 present without murmurs appreciated.  Abdomen:  +BS, soft, non-tender and non-distended. No HSM noted. No guarding or rebound. No masses appreciated.  Rectal:  Deferred  Msk:  Symmetrical without gross deformities. Normal posture. Extremities:  Without edema. Neurologic:  Alert and  oriented x4;  grossly normal neurologically. Skin:  Intact without significant  lesions or rashes. Psych:  Alert and cooperative. Normal mood and affect.   Assessment   ORRIS PERIN is a 88 y.o. male with a history of hypercholesterolemia, vertigo, glaucoma, complete heart block s/p dual-chamber pacemaker, frequent UTIs, history of cholecystectomy*** presenting today for hospital follow-up of acute pancreatitis.  Pancreatitis: Idiopathic in nature at this time as we have been unable to rule out infection versus malignancy versus drug induced etiology.  His lipase on previous admission was 6125.  He was treated with IV fluids and his diet was advanced without issue.  On discharge he had AST 48, ALT 117, alk phos 124, T. bili 1.5.  Dr. Gala Romney saw patient on discharge and felt as though the pattern of his LFT abnormalities suggest the pancreatitis is less likely due to occult tumor or drug-induced etiology. ***He will need CT chest, abdomen, and pelvis with pancreatic protocol in the middle to end of September.  Constipation:   PLAN   *** HFP today ?? Request recent labs from PCP office CT chest, abdomen, and pelvis with pancreatic protocol in September Continue MiraLAX?   Venetia Night, MSN, FNP-BC, AGACNP-BC [facility]

## 2022-07-05 ENCOUNTER — Ambulatory Visit: Payer: Medicare Other | Admitting: Gastroenterology

## 2022-07-10 DIAGNOSIS — I739 Peripheral vascular disease, unspecified: Secondary | ICD-10-CM | POA: Diagnosis not present

## 2022-07-10 DIAGNOSIS — B351 Tinea unguium: Secondary | ICD-10-CM | POA: Diagnosis not present

## 2022-07-19 ENCOUNTER — Ambulatory Visit: Payer: Medicare Other | Admitting: Urology

## 2022-07-24 ENCOUNTER — Ambulatory Visit (INDEPENDENT_AMBULATORY_CARE_PROVIDER_SITE_OTHER): Payer: Medicare Other | Admitting: Urology

## 2022-07-24 ENCOUNTER — Encounter: Payer: Self-pay | Admitting: Urology

## 2022-07-24 VITALS — BP 120/72 | HR 77 | Ht 67.0 in | Wt 165.0 lb

## 2022-07-24 DIAGNOSIS — Z87440 Personal history of urinary (tract) infections: Secondary | ICD-10-CM | POA: Diagnosis not present

## 2022-07-24 DIAGNOSIS — N200 Calculus of kidney: Secondary | ICD-10-CM

## 2022-07-24 DIAGNOSIS — N401 Enlarged prostate with lower urinary tract symptoms: Secondary | ICD-10-CM | POA: Diagnosis not present

## 2022-07-24 DIAGNOSIS — N390 Urinary tract infection, site not specified: Secondary | ICD-10-CM

## 2022-07-24 DIAGNOSIS — R351 Nocturia: Secondary | ICD-10-CM | POA: Diagnosis not present

## 2022-07-24 DIAGNOSIS — R350 Frequency of micturition: Secondary | ICD-10-CM

## 2022-07-24 MED ORDER — ALFUZOSIN HCL ER 10 MG PO TB24: 10.0000 mg | ORAL_TABLET | Freq: Every day | ORAL | 11 refills | Status: DC

## 2022-07-24 NOTE — Patient Instructions (Signed)

## 2022-07-24 NOTE — Progress Notes (Signed)
07/24/2022 10:54 AM   Chase Chase October 15, 1922 [id number]  Referring provider: Sharilyn Sites, MD [address] [place],  [place] [address]  Nocturia and frequent UTI   HPI: Mr Weltz is a 88yo here for followup for nocturia and frequent UTI. He stopped his uroxatral '10mg'$  after last visit and was then hospitalized with a UTI in June 2023. His nocturia is now 4-5x. Urine stream weaker. IPSS 24 QOl 5 on superbeta prostate. No other complaints today.   PMH: Past Medical History:  Diagnosis Date   At risk of UTI    CHB (complete heart block) ([place]) 09/2019   CHB (complete heart block) ([place]) 09/2019   Glaucoma    Glaucoma    HOH (hard of hearing)    Hypercholesterolemia    Pacemaker 09/19/2019   Biotronik dual chamber PPM   Prostate disease    unspecified   Vertigo     Surgical History: Past Surgical History:  Procedure Laterality Date   CHOLECYSTECTOMY     HERNIA REPAIR     bilateral   PACEMAKER IMPLANT N/A 09/19/2019   Procedure: PACEMAKER IMPLANT;  Surgeon: Evans Lance, MD;  Location: [facility];  Service: Cardiovascular;  Laterality: N/A;    Home Medications:  Allergies as of 07/24/2022   No Known Allergies      Medication List        Accurate as of July 24, 2022 10:54 AM. If you have any questions, ask your nurse or doctor.          alfuzosin 10 MG 24 hr tablet Commonly known as: UROXATRAL Take 1 tablet (10 mg total) by mouth at bedtime.   Fish Oil 645 MG Caps Take 1,290 mg by mouth daily. Take 2 capsules daily for cholesterol.   latanoprost 0.005 % ophthalmic solution Commonly known as: XALATAN Place 1 drop into both eyes at bedtime as needed.   levothyroxine 50 MCG tablet Commonly known as: SYNTHROID Take 50 mcg by mouth daily.   meclizine 25 MG tablet Commonly known as: ANTIVERT Take 25 mg by mouth as needed for dizziness (rarely needs).   ondansetron 4 MG tablet Commonly known as: Zofran Take 1 tablet (4 mg total)  by mouth daily as needed for nausea or vomiting.   PROSTATE HEALTH PO Take 2 tablets by mouth daily.   Vitamin D (Ergocalciferol) 1.25 MG (50000 UNIT) Caps capsule Commonly known as: DRISDOL Take 1 capsule by mouth every 30 (thirty) days.   zinc gluconate 50 MG tablet Take 50 mg by mouth daily.        Allergies: No Known Allergies  Family History: Family History  Problem Relation Age of Onset   Breast cancer Sister    Lung cancer Brother    Lung cancer Brother    Lung cancer Brother    Breast cancer Sister    Breast cancer Sister     Social History:  reports that he has quit smoking. His smoking use included cigarettes. He has a 16.00 pack-year smoking history. He quit smokeless tobacco use about 10 years ago.  His smokeless tobacco use included chew. He reports that he does not drink alcohol and does not use drugs.  ROS: All other review of systems were reviewed and are negative except what is noted above in HPI  Physical Exam: BP 120/72   Pulse 77   Ht '5\' 7"'$  (1.702 m)   Wt 165 lb (74.8 kg)   BMI 25.84 kg/m   Constitutional:  Alert  and oriented, No acute distress. HEENT: [place] AT, moist mucus membranes.  Trachea midline, no masses. Cardiovascular: No clubbing, cyanosis, or edema. Respiratory: Normal respiratory effort, no increased work of breathing. GI: Abdomen is soft, nontender, nondistended, no abdominal masses GU: No CVA tenderness.  Lymph: No cervical or inguinal lymphadenopathy. Skin: No rashes, bruises or suspicious lesions. Neurologic: Grossly intact, no focal deficits, moving all 4 extremities. Psychiatric: Normal mood and affect.  Laboratory Data: Lab Results  Component Value Date   WBC 12.8 (H) 05/28/2022   HGB 14.2 05/28/2022   HCT 42.3 05/28/2022   MCV 101.7 (H) 05/28/2022   PLT 140 (L) 05/28/2022    Lab Results  Component Value Date   CREATININE 0.88 05/28/2022    No results found for: "PSA"  No results found for: "TESTOSTERONE"  No  results found for: "HGBA1C"  Urinalysis    Component Value Date/Time   COLORURINE AMBER (A) 05/25/2022 1649   APPEARANCEUR HAZY (A) 05/25/2022 1649   APPEARANCEUR Cloudy (A) 04/17/2022 1202   LABSPEC 1.011 05/25/2022 1649   PHURINE 5.0 05/25/2022 1649   GLUCOSEU NEGATIVE 05/25/2022 1649   HGBUR MODERATE (A) 05/25/2022 1649   BILIRUBINUR NEGATIVE 05/25/2022 1649   BILIRUBINUR Negative 04/17/2022 1202   KETONESUR NEGATIVE 05/25/2022 1649   PROTEINUR 30 (A) 05/25/2022 1649   UROBILINOGEN 0.2 04/08/2013 1815   NITRITE NEGATIVE 05/25/2022 1649   LEUKOCYTESUR LARGE (A) 05/25/2022 1649    Lab Results  Component Value Date   LABMICR See below: 04/17/2022   WBCUA >30 (A) 04/17/2022   LABEPIT 0-10 04/17/2022   MUCUS Present 03/15/2022   BACTERIA RARE (A) 05/25/2022    Pertinent Imaging:  Results for orders placed in visit on 09/05/01  DG Abd 1 View  Narrative FINDINGS CLINICAL DATA:  RIGHT RENAL CALCULUS. SINGLE VIEW ABDOMEN: BILATERAL INTRARENAL CALCULI ARE SEEN.  THE LARGEST CALCULUS IN THE REGION OF THE MID POLE OF THE LEFT KIDNEY MEASURES 12 X 8 MM.  PELVIC VASCULAR CALCIFICATION IS ALSO NOTED BUT NO DEFINITE URETERAL CALCULI ARE SEEN.  SURGICAL CLIPS ARE SEEN IN THE UPPER RIGHT ABDOMEN. IMPRESSION BILATERAL INTRARENAL CALCULI, LARGEST IN THE LEFT KIDNEY MEASURING 12 MM.  Results for orders placed during the hospital encounter of 09/06/21  US Venous Img Lower Bilateral  Narrative CLINICAL DATA:  Bilateral lower extremity weakness and swelling.  EXAM: BILATERAL LOWER EXTREMITY VENOUS DOPPLER ULTRASOUND  TECHNIQUE: Gray-scale sonography with graded compression, as well as color Doppler and duplex ultrasound were performed to evaluate the lower extremity deep venous systems from the level of the common femoral vein and including the common femoral, femoral, profunda femoral, popliteal and calf veins including the posterior tibial, peroneal and gastrocnemius veins  when visible. The superficial great saphenous vein was also interrogated. Spectral Doppler was utilized to evaluate flow at rest and with distal augmentation maneuvers in the common femoral, femoral and popliteal veins.  COMPARISON:  None.  FINDINGS: RIGHT LOWER EXTREMITY  Common Femoral Vein: No evidence of thrombus. Normal compressibility, respiratory phasicity and response to augmentation.  Saphenofemoral Junction: No evidence of thrombus. Normal compressibility and flow on color Doppler imaging.  Profunda Femoral Vein: No evidence of thrombus. Normal compressibility and flow on color Doppler imaging.  Femoral Vein: No evidence of thrombus. Normal compressibility, respiratory phasicity and response to augmentation.  Popliteal Vein: No evidence of thrombus. Normal compressibility, respiratory phasicity and response to augmentation.  Calf Veins: Visualized right deep calf veins are patent without thrombus.  Superficial Great Saphenous Vein: No evidence of  thrombus. Normal compressibility.  LEFT LOWER EXTREMITY  Common Femoral Vein: No evidence of thrombus. Normal compressibility, respiratory phasicity and response to augmentation.  Saphenofemoral Junction: No evidence of thrombus. Normal compressibility and flow on color Doppler imaging.  Profunda Femoral Vein: No evidence of thrombus. Normal compressibility and flow on color Doppler imaging.  Femoral Vein: No evidence of thrombus. Normal compressibility, respiratory phasicity and response to augmentation.  Popliteal Vein: No evidence of thrombus. Normal compressibility, respiratory phasicity and response to augmentation.  Calf Veins: Visualized left deep calf veins are patent without thrombus.  Superficial Great Saphenous Vein: No evidence of thrombus. Normal compressibility.  Other Findings:  Bilateral ankle edema.  IMPRESSION: No evidence of deep venous thrombosis in either lower  extremity.   Electronically Signed By: Markus Daft M.D. On: 09/06/2021 17:39  No results found for this or any previous visit.  No results found for this or any previous visit.  No results found for this or any previous visit.  No results found for this or any previous visit.  Results for orders placed during the hospital encounter of 04/03/22  CT HEMATURIA WORKUP  Narrative CLINICAL DATA:  Hematuria, recurrent UTI * Tracking Code: BO *  EXAM: CT ABDOMEN AND PELVIS WITHOUT AND WITH CONTRAST  TECHNIQUE: Multidetector CT imaging of the abdomen and pelvis was performed following the standard protocol before and following the bolus administration of intravenous contrast.  RADIATION DOSE REDUCTION: This exam was performed according to the departmental dose-optimization program which includes automated exposure control, adjustment of the mA and/or kV according to patient size and/or use of iterative reconstruction technique.  CONTRAST:  152m OMNIPAQUE IOHEXOL 300 MG/ML  SOLN  COMPARISON:  None.  FINDINGS: Lower chest: Small, likely loculated left pleural effusion and associated atelectasis or consolidation. Coronary artery calcifications. Multiple soft tissue nodules and or enlarged lymph nodes in the partially imaged lower mediastinum, largest periaortic lesion measuring 5.9 x 4.1 cm (series 7, image 15).  Hepatobiliary: No focal liver abnormality is seen. Status post cholecystectomy. Postoperative biliary dilatation.  Pancreas: Unremarkable. No pancreatic ductal dilatation or surrounding inflammatory changes.  Spleen: Normal in size without significant abnormality.  Adrenals/Urinary Tract: Adrenal glands are unremarkable. Multiple bilateral renal calculi, including 1.6 and 1.3 cm calculi within the left renal pelvis, with mild associated left hydronephrosis. Multiple additional small bilateral nonobstructive calculi. Multiple simple, benign bilateral renal cysts,  and a small hemorrhagic or proteinaceous cyst of the lateral midportion of the right kidney (series 5, image 59). Multiple small bladder diverticula.  Stomach/Bowel: Stomach is within normal limits. Appendix appears normal. No evidence of bowel wall thickening, distention, or inflammatory changes. Sigmoid diverticula.  Vascular/Lymphatic: Aortic atherosclerosis. No enlarged abdominal or pelvic lymph nodes.  Reproductive: Prostatomegaly with median lobe hypertrophy.  Other: Fat containing right inguinal hernia.  No ascites.  Musculoskeletal: No acute or significant osseous findings.  IMPRESSION: 1. Multiple bilateral renal calculi, including 1.6 and 1.3 cm calculi within the left renal pelvis, with mild associated left hydronephrosis. Multiple additional small bilateral nonobstructive calculi. 2. Prostatomegaly with median lobe hypertrophy. 3. Multiple soft tissue nodules and or enlarged lymph nodes in the partially imaged lower mediastinum, largest periaortic lesion measuring 5.9 x 4.1 cm. Findings are highly concerning for malignancy. Recommend dedicated contrast enhanced CT of the chest. 4. Small, likely loculated left pleural effusion and associated atelectasis or consolidation. 5. Coronary artery disease.  Aortic Atherosclerosis (ICD10-I70.0).   Electronically Signed By: ADelanna AhmadiM.D. On: 04/03/2022 10:21  Results for orders placed  during the hospital encounter of 05/25/22  CT Renal Stone Study  Narrative CLINICAL DATA:  Abdominal and back pain. CT abdomen and pelvis 04/03/2022.  EXAM: CT ABDOMEN AND PELVIS WITHOUT CONTRAST  TECHNIQUE: Multidetector CT imaging of the abdomen and pelvis was performed following the standard protocol without IV contrast.  RADIATION DOSE REDUCTION: This exam was performed according to the departmental dose-optimization program which includes automated exposure control, adjustment of the mA and/or kV according to patient  size and/or use of iterative reconstruction technique.  COMPARISON:  CT abdomen and pelvis 04/03/2022.  FINDINGS: Lower chest: There is a stable small left pleural effusion with compressive atelectasis of the left lower lobe. There is some new patchy airspace disease in the right lower lobe. Para-aortic soft tissue density in the lower chest is unchanged measuring 4.4 x 5.1 cm.  Hepatobiliary: No focal liver abnormality is seen. Status post cholecystectomy. No biliary dilatation.  Pancreas: There is mild inflammatory stranding surrounding the pancreas. No ductal dilatation or fluid collection identified.  Spleen: Normal in size without focal abnormality.  Adrenals/Urinary Tract: The bladder is decompressed and not well evaluated. Small left bladder diverticulum again seen.  There is stable mild left-sided hydronephrosis. Again seen are 2 calculi within the left renal pelvis measuring 1 cm and 9 mm, unchanged. There are additional bilateral nonobstructing renal calculi also unchanged. Bilateral renal cysts are similar to the prior study. Hyperdense cyst in the right mid kidney appears unchanged. Hyperdense cyst in the inferior pole the right kidney appears unchanged.  Adrenal glands are within normal limits.  Stomach/Bowel: Stomach is within normal limits. Appendix appears normal. No evidence of bowel wall thickening, distention, or inflammatory changes. There is diffuse colonic diverticulosis.  Vascular/Lymphatic: Aortic atherosclerosis. No enlarged abdominal or pelvic lymph nodes.  Reproductive: Prostate gland is enlarged, unchanged.  Other: Fat containing right inguinal hernia is again seen. There is no ascites.  Musculoskeletal: Degenerative changes affect the spine.  IMPRESSION: 1. Findings compatible with acute uncomplicated pancreatitis. 2. Stable mild left-sided hydronephrosis secondary to 2 calculi within the left renal pelvis measuring up to 1 cm. 3.  Unchanged lower thoracic para-aortic soft tissue mass, indeterminate. This can be further evaluated with chest CT. 4. Minimal new patchy airspace disease in the right lower lobe. Stable small left pleural effusion. 5. Stable right renal calculi without hydronephrosis. 6. Colonic diverticulosis. 7. Prostatomegaly. 8.  Aortic Atherosclerosis (ICD10-I70.0).   Electronically Signed By: Ronney Asters M.D. On: 05/25/2022 17:12   Assessment & Plan:    1. Benign prostatic hyperplasia with urinary frequency -restart uroxatral '10mg'$  qhs - Urinalysis, Routine w reflex microscopic  2. Frequent UTI -restart uroxatral '10mg'$  qhs  3. Nocturia Restart uroxatral '10mg'$  qhs  RTC 3 months with renal US    No follow-ups on file.  Nicolette Bang, MD  [facility] [place]

## 2022-07-24 NOTE — Addendum Note (Signed)
Addended by: Nicolette Bang L on: 07/24/2022 11:01 AM   Modules accepted: Orders

## 2022-07-25 LAB — URINALYSIS, ROUTINE W REFLEX MICROSCOPIC
Bilirubin, UA: NEGATIVE
Glucose, UA: NEGATIVE
Ketones, UA: NEGATIVE
Nitrite, UA: NEGATIVE
Specific Gravity, UA: 1.015 (ref 1.005–1.030)
Urobilinogen, Ur: 0.2 mg/dL (ref 0.2–1.0)
pH, UA: 6 (ref 5.0–7.5)

## 2022-07-25 LAB — MICROSCOPIC EXAMINATION: WBC, UA: >30 /hpf — AB (ref 0–5)

## 2022-07-26 ENCOUNTER — Telehealth: Payer: Self-pay | Admitting: *Deleted

## 2022-07-26 NOTE — Telephone Encounter (Signed)
Patient is on recall for CT scan

## 2022-07-26 NOTE — Telephone Encounter (Signed)
Pt has NS'd and cancelled previous appts for HFU. Pending appt in September with courtney. Do you want the CT done before or can this be scheduled once he comes in?

## 2022-07-27 LAB — URINE CULTURE

## 2022-07-28 NOTE — Progress Notes (Signed)
Remote pacemaker transmission.   

## 2022-07-31 NOTE — Telephone Encounter (Signed)
Called pt and spoke with Daughter Langley Gauss. She stated let her discuss this with pt 1st before we do anything. She will call and let us know.

## 2022-08-04 ENCOUNTER — Ambulatory Visit (HOSPITAL_COMMUNITY)
Admission: RE | Admit: 2022-08-04 | Discharge: 2022-08-04 | Disposition: A | Discharge status: Home / Self Care | Payer: Medicare Other | Source: Ambulatory Visit | Attending: Family Medicine | Admitting: Family Medicine

## 2022-08-04 ENCOUNTER — Other Ambulatory Visit (HOSPITAL_COMMUNITY): Payer: Self-pay | Admitting: Family Medicine

## 2022-08-04 DIAGNOSIS — R600 Localized edema: Secondary | ICD-10-CM

## 2022-08-04 DIAGNOSIS — Z6826 Body mass index (BMI) 26.0-26.9, adult: Secondary | ICD-10-CM | POA: Diagnosis not present

## 2022-08-04 DIAGNOSIS — M79605 Pain in left leg: Secondary | ICD-10-CM | POA: Diagnosis not present

## 2022-08-04 DIAGNOSIS — E663 Overweight: Secondary | ICD-10-CM | POA: Diagnosis not present

## 2022-08-07 ENCOUNTER — Telehealth: Payer: Self-pay

## 2022-08-07 MED ORDER — NITROFURANTOIN MONOHYD MACRO 100 MG PO CAPS: 100.0000 mg | ORAL_CAPSULE | Freq: Two times a day (BID) | ORAL | 0 refills | Status: DC

## 2022-08-07 NOTE — Telephone Encounter (Signed)
-----   Message from Cleon Gustin, MD sent at 08/07/2022  4:39 PM EDT ----- Macrobid '100mg'$  BID for 7 days ----- Message ----- From: Sherrilyn Rist, CMA Sent: 07/27/2022   8:35 AM EDT To: Cleon Gustin, MD  Please Review

## 2022-08-07 NOTE — Telephone Encounter (Signed)
Called patient with no answer. Left voice message for patient let him know that his cx was positive and prescription was sent in for antibox

## 2022-08-08 NOTE — Telephone Encounter (Signed)
Daughter returned call to office today in regard of message she received yesterday about her fathers urine culture.  Reviewed telephone encounter with daughter of medication sent to pharmacy and to completed until all gone. Daughter voiced understanding.

## 2022-08-31 ENCOUNTER — Inpatient Hospital Stay: Payer: Medicare Other | Admitting: Gastroenterology

## 2022-09-06 DIAGNOSIS — R319 Hematuria, unspecified: Secondary | ICD-10-CM | POA: Diagnosis not present

## 2022-09-18 DIAGNOSIS — I739 Peripheral vascular disease, unspecified: Secondary | ICD-10-CM | POA: Diagnosis not present

## 2022-09-18 DIAGNOSIS — B351 Tinea unguium: Secondary | ICD-10-CM | POA: Diagnosis not present

## 2022-09-18 DIAGNOSIS — M79675 Pain in left toe(s): Secondary | ICD-10-CM | POA: Diagnosis not present

## 2022-09-25 ENCOUNTER — Ambulatory Visit (INDEPENDENT_AMBULATORY_CARE_PROVIDER_SITE_OTHER): Payer: Medicare Other

## 2022-09-25 DIAGNOSIS — I442 Atrioventricular block, complete: Secondary | ICD-10-CM

## 2022-09-26 LAB — CUP PACEART REMOTE DEVICE CHECK
Date Time Interrogation Session: 20231013083015
Implantable Lead Implant Date: 20201009
Implantable Lead Implant Date: 20201009
Implantable Lead Location: 753859
Implantable Lead Location: 753860
Implantable Lead Model: 377
Implantable Lead Model: 377
Implantable Lead Serial Number: 81023057
Implantable Lead Serial Number: 81187700
Implantable Pulse Generator Implant Date: 20201009
Pulse Gen Model: 407145
Pulse Gen Serial Number: 69608254

## 2022-10-06 ENCOUNTER — Ambulatory Visit (INDEPENDENT_AMBULATORY_CARE_PROVIDER_SITE_OTHER): Payer: Medicare Other | Admitting: Urology

## 2022-10-06 VITALS — BP 118/71 | HR 64

## 2022-10-06 DIAGNOSIS — N401 Enlarged prostate with lower urinary tract symptoms: Secondary | ICD-10-CM | POA: Diagnosis not present

## 2022-10-06 DIAGNOSIS — Z87440 Personal history of urinary (tract) infections: Secondary | ICD-10-CM | POA: Diagnosis not present

## 2022-10-06 DIAGNOSIS — N390 Urinary tract infection, site not specified: Secondary | ICD-10-CM

## 2022-10-06 DIAGNOSIS — N200 Calculus of kidney: Secondary | ICD-10-CM

## 2022-10-06 DIAGNOSIS — R350 Frequency of micturition: Secondary | ICD-10-CM

## 2022-10-06 LAB — URINALYSIS, ROUTINE W REFLEX MICROSCOPIC
Bilirubin, UA: NEGATIVE
Glucose, UA: NEGATIVE
Ketones, UA: NEGATIVE
Nitrite, UA: NEGATIVE
Specific Gravity, UA: 1.01 (ref 1.005–1.030)
Urobilinogen, Ur: 0.2 mg/dL (ref 0.2–1.0)
pH, UA: 6 (ref 5.0–7.5)

## 2022-10-06 LAB — MICROSCOPIC EXAMINATION: WBC, UA: >30 /hpf — AB (ref 0–5)

## 2022-10-06 MED ORDER — NITROFURANTOIN MONOHYD MACRO 100 MG PO CAPS: 100.0000 mg | ORAL_CAPSULE | Freq: Two times a day (BID) | ORAL | 0 refills | Status: DC

## 2022-10-06 NOTE — Progress Notes (Unsigned)
10/06/2022 12:34 PM   Chase Chase [birth year] [id number]  Referring provider: Sharilyn Sites, [address],  [place] [address]  No chief complaint on file.   HPI:    PMH: Past Medical History:  Diagnosis Date   At risk of UTI    CHB (complete heart block) ([place]) 09/2019   CHB (complete heart block) ([place]) 09/2019   Glaucoma    Glaucoma    HOH (hard of hearing)    Hypercholesterolemia    Pacemaker 09/19/2019   Biotronik dual chamber PPM   Prostate disease    unspecified   Vertigo     Surgical History: Past Surgical History:  Procedure Laterality Date   CHOLECYSTECTOMY     HERNIA REPAIR     bilateral   PACEMAKER IMPLANT N/A 09/19/2019   Procedure: PACEMAKER IMPLANT;  Surgeon: Evans Lance, MD;  Location: [facility];  Service: Cardiovascular;  Laterality: N/A;    Home Medications:  Allergies as of 10/06/2022   No Known Allergies      Medication List        Accurate as of October 06, 2022 12:34 PM. If you have any questions, ask your nurse or doctor.          alfuzosin 10 MG 24 hr tablet Commonly known as: UROXATRAL Take 1 tablet (10 mg total) by mouth at bedtime.   Fish Oil 645 MG Caps Take 1,290 mg by mouth daily. Take 2 capsules daily for cholesterol.   latanoprost 0.005 % ophthalmic solution Commonly known as: XALATAN Place 1 drop into both eyes at bedtime as needed.   levothyroxine 50 MCG tablet Commonly known as: SYNTHROID Take 50 mcg by mouth daily.   meclizine 25 MG tablet Commonly known as: ANTIVERT Take 25 mg by mouth as needed for dizziness (rarely needs).   nitrofurantoin (macrocrystal-monohydrate) 100 MG capsule Commonly known as: MACROBID Take 1 capsule (100 mg total) by mouth every 12 (twelve) hours.   ondansetron 4 MG tablet Commonly known as: Zofran Take 1 tablet (4 mg total) by mouth daily as needed for nausea or vomiting.   PROSTATE HEALTH PO Take 2 tablets by mouth daily.   Vitamin D  (Ergocalciferol) 1.25 MG (50000 UNIT) Caps capsule Commonly known as: DRISDOL Take 1 capsule by mouth every 30 (thirty) days.   zinc gluconate 50 MG tablet Take 50 mg by mouth daily.        Allergies: No Known Allergies  Family History: Family History  Problem Relation Age of Onset   Breast cancer Sister    Lung cancer Brother    Lung cancer Brother    Lung cancer Brother    Breast cancer Sister    Breast cancer Sister     Social History:  reports that he has quit smoking. His smoking use included cigarettes. He has a 16.00 pack-year smoking history. He quit smokeless tobacco use about 10 years ago.  His smokeless tobacco use included chew. He reports that he does not drink alcohol and does not use drugs.  ROS: All other review of systems were reviewed and are negative except what is noted above in HPI  Physical Exam: BP 118/71   Pulse 64   Constitutional:  Alert and oriented, No acute distress. HEENT: [place] AT, moist mucus membranes.  Trachea midline, no masses. Cardiovascular: No clubbing, cyanosis, or edema. Respiratory: Normal respiratory effort, no increased work of breathing. GI: Abdomen is soft, nontender, nondistended, no abdominal masses GU: No CVA tenderness.  Lymph: No cervical or inguinal lymphadenopathy. Skin: No rashes, bruises or suspicious lesions. Neurologic: Grossly intact, no focal deficits, moving all 4 extremities. Psychiatric: Normal mood and affect.  Laboratory Data: Lab Results  Component Value Date   WBC 12.8 (H) 05/28/2022   HGB 14.2 05/28/2022   HCT 42.3 05/28/2022   MCV 101.7 (H) 05/28/2022   PLT 140 (L) 05/28/2022    Lab Results  Component Value Date   CREATININE 0.88 05/28/2022    No results found for: "PSA"  No results found for: "TESTOSTERONE"  No results found for: "HGBA1C"  Urinalysis    Component Value Date/Time   COLORURINE AMBER (A) 05/25/2022 1649   APPEARANCEUR Cloudy (A) 07/24/2022 1109   LABSPEC 1.011  05/25/2022 1649   PHURINE 5.0 05/25/2022 1649   GLUCOSEU Negative 07/24/2022 1109   HGBUR MODERATE (A) 05/25/2022 1649   BILIRUBINUR Negative 07/24/2022 1109   KETONESUR NEGATIVE 05/25/2022 1649   PROTEINUR 2+ (A) 07/24/2022 1109   PROTEINUR 30 (A) 05/25/2022 1649   UROBILINOGEN 0.2 04/08/2013 1815   NITRITE Negative 07/24/2022 1109   NITRITE NEGATIVE 05/25/2022 1649   LEUKOCYTESUR 3+ (A) 07/24/2022 1109   LEUKOCYTESUR LARGE (A) 05/25/2022 1649    Lab Results  Component Value Date   LABMICR See below: 07/24/2022   WBCUA >30 (A) 07/24/2022   LABEPIT 0-10 07/24/2022   MUCUS Present 03/15/2022   BACTERIA Moderate (A) 07/24/2022    Pertinent Imaging: *** Results for orders placed in visit on 09/05/01  DG Abd 1 View  Narrative FINDINGS CLINICAL DATA:  RIGHT RENAL CALCULUS. SINGLE VIEW ABDOMEN: BILATERAL INTRARENAL CALCULI ARE SEEN.  THE LARGEST CALCULUS IN THE REGION OF THE MID POLE OF THE LEFT KIDNEY MEASURES 12 X 8 MM.  PELVIC VASCULAR CALCIFICATION IS ALSO NOTED BUT NO DEFINITE URETERAL CALCULI ARE SEEN.  SURGICAL CLIPS ARE SEEN IN THE UPPER RIGHT ABDOMEN. IMPRESSION BILATERAL INTRARENAL CALCULI, LARGEST IN THE LEFT KIDNEY MEASURING 12 MM.  Results for orders placed during the hospital encounter of 09/06/21  US Venous Img Lower Bilateral  Narrative CLINICAL DATA:  Bilateral lower extremity weakness and swelling.  EXAM: BILATERAL LOWER EXTREMITY VENOUS DOPPLER ULTRASOUND  TECHNIQUE: Gray-scale sonography with graded compression, as well as color Doppler and duplex ultrasound were performed to evaluate the lower extremity deep venous systems from the level of the common femoral vein and including the common femoral, femoral, profunda femoral, popliteal and calf veins including the posterior tibial, peroneal and gastrocnemius veins when visible. The superficial great saphenous vein was also interrogated. Spectral Doppler was utilized to evaluate flow at rest and  with distal augmentation maneuvers in the common femoral, femoral and popliteal veins.  COMPARISON:  None.  FINDINGS: RIGHT LOWER EXTREMITY  Common Femoral Vein: No evidence of thrombus. Normal compressibility, respiratory phasicity and response to augmentation.  Saphenofemoral Junction: No evidence of thrombus. Normal compressibility and flow on color Doppler imaging.  Profunda Femoral Vein: No evidence of thrombus. Normal compressibility and flow on color Doppler imaging.  Femoral Vein: No evidence of thrombus. Normal compressibility, respiratory phasicity and response to augmentation.  Popliteal Vein: No evidence of thrombus. Normal compressibility, respiratory phasicity and response to augmentation.  Calf Veins: Visualized right deep calf veins are patent without thrombus.  Superficial Great Saphenous Vein: No evidence of thrombus. Normal compressibility.  LEFT LOWER EXTREMITY  Common Femoral Vein: No evidence of thrombus. Normal compressibility, respiratory phasicity and response to augmentation.  Saphenofemoral Junction: No evidence of thrombus. Normal compressibility and flow on color Doppler imaging.  Profunda Femoral Vein: No evidence of thrombus. Normal compressibility and flow on color Doppler imaging.  Femoral Vein: No evidence of thrombus. Normal compressibility, respiratory phasicity and response to augmentation.  Popliteal Vein: No evidence of thrombus. Normal compressibility, respiratory phasicity and response to augmentation.  Calf Veins: Visualized left deep calf veins are patent without thrombus.  Superficial Great Saphenous Vein: No evidence of thrombus. Normal compressibility.  Other Findings:  Bilateral ankle edema.  IMPRESSION: No evidence of deep venous thrombosis in either lower extremity.   Electronically Signed By: Markus Daft M.D. On: 09/06/2021 17:39  No results found for this or any previous visit.  No results found for this  or any previous visit.  No results found for this or any previous visit.  No valid procedures specified. Results for orders placed during the hospital encounter of 04/03/22  CT HEMATURIA WORKUP  Narrative CLINICAL DATA:  Hematuria, recurrent UTI * Tracking Code: BO *  EXAM: CT ABDOMEN AND PELVIS WITHOUT AND WITH CONTRAST  TECHNIQUE: Multidetector CT imaging of the abdomen and pelvis was performed following the standard protocol before and following the bolus administration of intravenous contrast.  RADIATION DOSE REDUCTION: This exam was performed according to the departmental dose-optimization program which includes automated exposure control, adjustment of the mA and/or kV according to patient size and/or use of iterative reconstruction technique.  CONTRAST:  121m OMNIPAQUE IOHEXOL 300 MG/ML  SOLN  COMPARISON:  None.  FINDINGS: Lower chest: Small, likely loculated left pleural effusion and associated atelectasis or consolidation. Coronary artery calcifications. Multiple soft tissue nodules and or enlarged lymph nodes in the partially imaged lower mediastinum, largest periaortic lesion measuring 5.9 x 4.1 cm (series 7, image 15).  Hepatobiliary: No focal liver abnormality is seen. Status post cholecystectomy. Postoperative biliary dilatation.  Pancreas: Unremarkable. No pancreatic ductal dilatation or surrounding inflammatory changes.  Spleen: Normal in size without significant abnormality.  Adrenals/Urinary Tract: Adrenal glands are unremarkable. Multiple bilateral renal calculi, including 1.6 and 1.3 cm calculi within the left renal pelvis, with mild associated left hydronephrosis. Multiple additional small bilateral nonobstructive calculi. Multiple simple, benign bilateral renal cysts, and a small hemorrhagic or proteinaceous cyst of the lateral midportion of the right kidney (series 5, image 59). Multiple small bladder diverticula.  Stomach/Bowel: Stomach is  within normal limits. Appendix appears normal. No evidence of bowel wall thickening, distention, or inflammatory changes. Sigmoid diverticula.  Vascular/Lymphatic: Aortic atherosclerosis. No enlarged abdominal or pelvic lymph nodes.  Reproductive: Prostatomegaly with median lobe hypertrophy.  Other: Fat containing right inguinal hernia.  No ascites.  Musculoskeletal: No acute or significant osseous findings.  IMPRESSION: 1. Multiple bilateral renal calculi, including 1.6 and 1.3 cm calculi within the left renal pelvis, with mild associated left hydronephrosis. Multiple additional small bilateral nonobstructive calculi. 2. Prostatomegaly with median lobe hypertrophy. 3. Multiple soft tissue nodules and or enlarged lymph nodes in the partially imaged lower mediastinum, largest periaortic lesion measuring 5.9 x 4.1 cm. Findings are highly concerning for malignancy. Recommend dedicated contrast enhanced CT of the chest. 4. Small, likely loculated left pleural effusion and associated atelectasis or consolidation. 5. Coronary artery disease.  Aortic Atherosclerosis (ICD10-I70.0).   Electronically Signed By: ADelanna AhmadiM.D. On: 04/03/2022 10:21  Results for orders placed during the hospital encounter of 05/25/22  CT Renal Stone Study  Narrative CLINICAL DATA:  Abdominal and back pain. CT abdomen and pelvis 04/03/2022.  EXAM: CT ABDOMEN AND PELVIS WITHOUT CONTRAST  TECHNIQUE: Multidetector CT imaging of the abdomen and pelvis was performed  following the standard protocol without IV contrast.  RADIATION DOSE REDUCTION: This exam was performed according to the departmental dose-optimization program which includes automated exposure control, adjustment of the mA and/or kV according to patient size and/or use of iterative reconstruction technique.  COMPARISON:  CT abdomen and pelvis 04/03/2022.  FINDINGS: Lower chest: There is a stable small left pleural effusion  with compressive atelectasis of the left lower lobe. There is some new patchy airspace disease in the right lower lobe. Para-aortic soft tissue density in the lower chest is unchanged measuring 4.4 x 5.1 cm.  Hepatobiliary: No focal liver abnormality is seen. Status post cholecystectomy. No biliary dilatation.  Pancreas: There is mild inflammatory stranding surrounding the pancreas. No ductal dilatation or fluid collection identified.  Spleen: Normal in size without focal abnormality.  Adrenals/Urinary Tract: The bladder is decompressed and not well evaluated. Small left bladder diverticulum again seen.  There is stable mild left-sided hydronephrosis. Again seen are 2 calculi within the left renal pelvis measuring 1 cm and 9 mm, unchanged. There are additional bilateral nonobstructing renal calculi also unchanged. Bilateral renal cysts are similar to the prior study. Hyperdense cyst in the right mid kidney appears unchanged. Hyperdense cyst in the inferior pole the right kidney appears unchanged.  Adrenal glands are within normal limits.  Stomach/Bowel: Stomach is within normal limits. Appendix appears normal. No evidence of bowel wall thickening, distention, or inflammatory changes. There is diffuse colonic diverticulosis.  Vascular/Lymphatic: Aortic atherosclerosis. No enlarged abdominal or pelvic lymph nodes.  Reproductive: Prostate gland is enlarged, unchanged.  Other: Fat containing right inguinal hernia is again seen. There is no ascites.  Musculoskeletal: Degenerative changes affect the spine.  IMPRESSION: 1. Findings compatible with acute uncomplicated pancreatitis. 2. Stable mild left-sided hydronephrosis secondary to 2 calculi within the left renal pelvis measuring up to 1 cm. 3. Unchanged lower thoracic para-aortic soft tissue mass, indeterminate. This can be further evaluated with chest CT. 4. Minimal new patchy airspace disease in the right lower  lobe. Stable small left pleural effusion. 5. Stable right renal calculi without hydronephrosis. 6. Colonic diverticulosis. 7. Prostatomegaly. 8.  Aortic Atherosclerosis (ICD10-I70.0).   Electronically Signed By: Ronney Asters M.D. On: 05/25/2022 17:12   Assessment & Plan:    1. Nephrolithiasis CT stone study  - Urinalysis, Routine w reflex microscopic  2. Benign prostatic hyperplasia with urinary frequency ***  3. Frequent UTI -urine for culture -macrobid '100mg'$  BID for 7 days   No follow-ups on file.  Nicolette Bang, MD  [facility] [place]

## 2022-10-09 ENCOUNTER — Inpatient Hospital Stay (HOSPITAL_COMMUNITY)
Admission: EM | Admit: 2022-10-09 | Discharge: 2022-10-12 | DRG: 682 | Disposition: A | Discharge status: Skilled Nursing Facility | Payer: Medicare Other | Attending: Internal Medicine | Admitting: Internal Medicine

## 2022-10-09 ENCOUNTER — Emergency Department (HOSPITAL_COMMUNITY): Payer: Medicare Other

## 2022-10-09 ENCOUNTER — Other Ambulatory Visit: Payer: Self-pay

## 2022-10-09 ENCOUNTER — Encounter (HOSPITAL_COMMUNITY): Payer: Self-pay | Admitting: *Deleted

## 2022-10-09 DIAGNOSIS — W19XXXA Unspecified fall, initial encounter: Secondary | ICD-10-CM

## 2022-10-09 DIAGNOSIS — R488 Other symbolic dysfunctions: Secondary | ICD-10-CM | POA: Diagnosis not present

## 2022-10-09 DIAGNOSIS — R443 Hallucinations, unspecified: Secondary | ICD-10-CM | POA: Diagnosis present

## 2022-10-09 DIAGNOSIS — Z20822 Contact with and (suspected) exposure to covid-19: Secondary | ICD-10-CM | POA: Diagnosis not present

## 2022-10-09 DIAGNOSIS — N132 Hydronephrosis with renal and ureteral calculous obstruction: Secondary | ICD-10-CM | POA: Diagnosis not present

## 2022-10-09 DIAGNOSIS — T796XXA Traumatic ischemia of muscle, initial encounter: Secondary | ICD-10-CM | POA: Diagnosis not present

## 2022-10-09 DIAGNOSIS — M6282 Rhabdomyolysis: Secondary | ICD-10-CM | POA: Diagnosis present

## 2022-10-09 DIAGNOSIS — Z801 Family history of malignant neoplasm of trachea, bronchus and lung: Secondary | ICD-10-CM

## 2022-10-09 DIAGNOSIS — R222 Localized swelling, mass and lump, trunk: Secondary | ICD-10-CM | POA: Diagnosis present

## 2022-10-09 DIAGNOSIS — E7800 Pure hypercholesterolemia, unspecified: Secondary | ICD-10-CM | POA: Diagnosis present

## 2022-10-09 DIAGNOSIS — I442 Atrioventricular block, complete: Secondary | ICD-10-CM | POA: Diagnosis not present

## 2022-10-09 DIAGNOSIS — E8809 Other disorders of plasma-protein metabolism, not elsewhere classified: Secondary | ICD-10-CM | POA: Diagnosis present

## 2022-10-09 DIAGNOSIS — R410 Disorientation, unspecified: Secondary | ICD-10-CM | POA: Diagnosis present

## 2022-10-09 DIAGNOSIS — W1830XA Fall on same level, unspecified, initial encounter: Secondary | ICD-10-CM | POA: Diagnosis present

## 2022-10-09 DIAGNOSIS — S0993XA Unspecified injury of face, initial encounter: Secondary | ICD-10-CM | POA: Diagnosis not present

## 2022-10-09 DIAGNOSIS — R9431 Abnormal electrocardiogram [ECG] [EKG]: Secondary | ICD-10-CM | POA: Diagnosis present

## 2022-10-09 DIAGNOSIS — Z66 Do not resuscitate: Secondary | ICD-10-CM | POA: Diagnosis not present

## 2022-10-09 DIAGNOSIS — J9811 Atelectasis: Secondary | ICD-10-CM | POA: Diagnosis not present

## 2022-10-09 DIAGNOSIS — J9859 Other diseases of mediastinum, not elsewhere classified: Secondary | ICD-10-CM | POA: Diagnosis present

## 2022-10-09 DIAGNOSIS — S80211A Abrasion, right knee, initial encounter: Secondary | ICD-10-CM | POA: Diagnosis present

## 2022-10-09 DIAGNOSIS — Y92000 Kitchen of unspecified non-institutional (private) residence as  the place of occurrence of the external cause: Secondary | ICD-10-CM

## 2022-10-09 DIAGNOSIS — Z9181 History of falling: Secondary | ICD-10-CM | POA: Diagnosis not present

## 2022-10-09 DIAGNOSIS — M25561 Pain in right knee: Secondary | ICD-10-CM | POA: Diagnosis not present

## 2022-10-09 DIAGNOSIS — Z741 Need for assistance with personal care: Secondary | ICD-10-CM | POA: Diagnosis not present

## 2022-10-09 DIAGNOSIS — Z87891 Personal history of nicotine dependence: Secondary | ICD-10-CM | POA: Diagnosis not present

## 2022-10-09 DIAGNOSIS — H9193 Unspecified hearing loss, bilateral: Secondary | ICD-10-CM | POA: Diagnosis not present

## 2022-10-09 DIAGNOSIS — R262 Difficulty in walking, not elsewhere classified: Secondary | ICD-10-CM | POA: Diagnosis not present

## 2022-10-09 DIAGNOSIS — Y92009 Unspecified place in unspecified non-institutional (private) residence as the place of occurrence of the external cause: Secondary | ICD-10-CM | POA: Diagnosis not present

## 2022-10-09 DIAGNOSIS — M6281 Muscle weakness (generalized): Secondary | ICD-10-CM | POA: Diagnosis not present

## 2022-10-09 DIAGNOSIS — R278 Other lack of coordination: Secondary | ICD-10-CM | POA: Diagnosis not present

## 2022-10-09 DIAGNOSIS — N390 Urinary tract infection, site not specified: Secondary | ICD-10-CM | POA: Diagnosis not present

## 2022-10-09 DIAGNOSIS — Z950 Presence of cardiac pacemaker: Secondary | ICD-10-CM

## 2022-10-09 DIAGNOSIS — Z79899 Other long term (current) drug therapy: Secondary | ICD-10-CM

## 2022-10-09 DIAGNOSIS — Z9049 Acquired absence of other specified parts of digestive tract: Secondary | ICD-10-CM

## 2022-10-09 DIAGNOSIS — I63532 Cerebral infarction due to unspecified occlusion or stenosis of left posterior cerebral artery: Secondary | ICD-10-CM | POA: Diagnosis not present

## 2022-10-09 DIAGNOSIS — I712 Thoracic aortic aneurysm, without rupture, unspecified: Secondary | ICD-10-CM | POA: Diagnosis not present

## 2022-10-09 DIAGNOSIS — N400 Enlarged prostate without lower urinary tract symptoms: Secondary | ICD-10-CM | POA: Diagnosis present

## 2022-10-09 DIAGNOSIS — H409 Unspecified glaucoma: Secondary | ICD-10-CM | POA: Diagnosis not present

## 2022-10-09 DIAGNOSIS — Z23 Encounter for immunization: Secondary | ICD-10-CM

## 2022-10-09 DIAGNOSIS — E860 Dehydration: Secondary | ICD-10-CM | POA: Diagnosis present

## 2022-10-09 DIAGNOSIS — N179 Acute kidney failure, unspecified: Principal | ICD-10-CM | POA: Diagnosis present

## 2022-10-09 DIAGNOSIS — G9341 Metabolic encephalopathy: Secondary | ICD-10-CM | POA: Diagnosis present

## 2022-10-09 DIAGNOSIS — Z043 Encounter for examination and observation following other accident: Secondary | ICD-10-CM | POA: Diagnosis not present

## 2022-10-09 DIAGNOSIS — N1330 Unspecified hydronephrosis: Secondary | ICD-10-CM | POA: Diagnosis not present

## 2022-10-09 DIAGNOSIS — N136 Pyonephrosis: Secondary | ICD-10-CM | POA: Diagnosis present

## 2022-10-09 DIAGNOSIS — K5730 Diverticulosis of large intestine without perforation or abscess without bleeding: Secondary | ICD-10-CM | POA: Diagnosis not present

## 2022-10-09 DIAGNOSIS — R748 Abnormal levels of other serum enzymes: Secondary | ICD-10-CM | POA: Diagnosis not present

## 2022-10-09 DIAGNOSIS — N401 Enlarged prostate with lower urinary tract symptoms: Secondary | ICD-10-CM | POA: Diagnosis not present

## 2022-10-09 DIAGNOSIS — N323 Diverticulum of bladder: Secondary | ICD-10-CM | POA: Diagnosis not present

## 2022-10-09 DIAGNOSIS — Z79890 Hormone replacement therapy (postmenopausal): Secondary | ICD-10-CM

## 2022-10-09 DIAGNOSIS — R945 Abnormal results of liver function studies: Secondary | ICD-10-CM | POA: Diagnosis not present

## 2022-10-09 DIAGNOSIS — M47812 Spondylosis without myelopathy or radiculopathy, cervical region: Secondary | ICD-10-CM | POA: Diagnosis not present

## 2022-10-09 DIAGNOSIS — S80212A Abrasion, left knee, initial encounter: Secondary | ICD-10-CM | POA: Diagnosis present

## 2022-10-09 DIAGNOSIS — E039 Hypothyroidism, unspecified: Secondary | ICD-10-CM | POA: Diagnosis not present

## 2022-10-09 DIAGNOSIS — J90 Pleural effusion, not elsewhere classified: Secondary | ICD-10-CM | POA: Diagnosis not present

## 2022-10-09 DIAGNOSIS — M25562 Pain in left knee: Secondary | ICD-10-CM | POA: Diagnosis not present

## 2022-10-09 DIAGNOSIS — M19021 Primary osteoarthritis, right elbow: Secondary | ICD-10-CM | POA: Diagnosis not present

## 2022-10-09 DIAGNOSIS — N200 Calculus of kidney: Secondary | ICD-10-CM | POA: Diagnosis not present

## 2022-10-09 LAB — CBC
HCT: 44.2 % (ref 39.0–52.0)
Hemoglobin: 15.3 g/dL (ref 13.0–17.0)
MCH: 34.3 pg — ABNORMAL HIGH (ref 26.0–34.0)
MCHC: 34.6 g/dL (ref 30.0–36.0)
MCV: 99.1 fL (ref 80.0–100.0)
Platelets: 165 K/uL (ref 150–400)
RBC: 4.46 MIL/uL (ref 4.22–5.81)
RDW: 12.4 % (ref 11.5–15.5)
WBC: 23.1 K/uL — ABNORMAL HIGH (ref 4.0–10.5)
nRBC: 0 % (ref 0.0–0.2)

## 2022-10-09 LAB — URINALYSIS, ROUTINE W REFLEX MICROSCOPIC
Bacteria, UA: NONE SEEN
Bilirubin Urine: NEGATIVE
Glucose, UA: 50 mg/dL — AB
Ketones, ur: 5 mg/dL — AB
Nitrite: NEGATIVE
Protein, ur: 100 mg/dL — AB
Specific Gravity, Urine: 1.019 (ref 1.005–1.030)
WBC, UA: >50 WBC/hpf — ABNORMAL HIGH (ref 0–5)
pH: 5 (ref 5.0–8.0)

## 2022-10-09 LAB — COMPREHENSIVE METABOLIC PANEL
ALT: 49 U/L — ABNORMAL HIGH (ref 0–44)
AST: 208 U/L — ABNORMAL HIGH (ref 15–41)
Albumin: 3.6 g/dL (ref 3.5–5.0)
Alkaline Phosphatase: 80 U/L (ref 38–126)
Anion gap: 11 (ref 5–15)
BUN: 45 mg/dL — ABNORMAL HIGH (ref 8–23)
CO2: 26 mmol/L (ref 22–32)
Calcium: 10.2 mg/dL (ref 8.9–10.3)
Chloride: 102 mmol/L (ref 98–111)
Creatinine, Ser: 1.61 mg/dL — ABNORMAL HIGH (ref 0.61–1.24)
GFR, Estimated: 38 mL/min — ABNORMAL LOW (ref >60)
Glucose, Bld: 150 mg/dL — ABNORMAL HIGH (ref 70–99)
Potassium: 4.3 mmol/L (ref 3.5–5.1)
Sodium: 139 mmol/L (ref 135–145)
Total Bilirubin: 1.5 mg/dL — ABNORMAL HIGH (ref 0.3–1.2)
Total Protein: 7 g/dL (ref 6.5–8.1)

## 2022-10-09 LAB — CK: Total CK: 7823 U/L — ABNORMAL HIGH (ref 49–397)

## 2022-10-09 LAB — MAGNESIUM: Magnesium: 2.1 mg/dL (ref 1.7–2.4)

## 2022-10-09 MED ORDER — SODIUM CHLORIDE 0.9 % IV SOLN
1.0000 g | INTRAVENOUS | Status: DC
  Administered 2022-10-09 – 2022-10-11 (×3): 1 g via INTRAVENOUS
  Filled 2022-10-09 (×3): qty 10

## 2022-10-09 MED ORDER — ACETAMINOPHEN 325 MG PO TABS
650.0000 mg | ORAL_TABLET | Freq: Four times a day (QID) | ORAL | Status: DC | PRN
  Filled 2022-10-09: qty 2

## 2022-10-09 MED ORDER — LEVOTHYROXINE SODIUM 50 MCG PO TABS
50.0000 ug | ORAL_TABLET | Freq: Every day | ORAL | Status: DC
  Administered 2022-10-10 – 2022-10-12 (×3): 50 ug via ORAL
  Filled 2022-10-09 (×3): qty 1

## 2022-10-09 MED ORDER — HEPARIN SODIUM (PORCINE) 5000 UNIT/ML IJ SOLN
5000.0000 [IU] | Freq: Three times a day (TID) | INTRAMUSCULAR | Status: DC
  Administered 2022-10-09 – 2022-10-12 (×9): 5000 [IU] via SUBCUTANEOUS
  Filled 2022-10-09 (×9): qty 1

## 2022-10-09 MED ORDER — INFLUENZA VAC A&B SA ADJ QUAD 0.5 ML IM PRSY
0.5000 mL | PREFILLED_SYRINGE | INTRAMUSCULAR | Status: AC
  Administered 2022-10-10: 0.5 mL via INTRAMUSCULAR
  Filled 2022-10-09: qty 0.5

## 2022-10-09 MED ORDER — LACTATED RINGERS IV SOLN: INTRAVENOUS | Status: AC

## 2022-10-09 MED ORDER — SODIUM CHLORIDE 0.9 % IV SOLN: Freq: Once | INTRAVENOUS | Status: AC

## 2022-10-09 MED ORDER — POLYETHYLENE GLYCOL 3350 17 G PO PACK: 17.0000 g | PACK | Freq: Every day | ORAL | Status: DC | PRN

## 2022-10-09 MED ORDER — LACTATED RINGERS IV BOLUS
1000.0000 mL | Freq: Once | INTRAVENOUS | Status: AC
  Administered 2022-10-09: 1000 mL via INTRAVENOUS

## 2022-10-09 MED ORDER — ACETAMINOPHEN 650 MG RE SUPP: 650.0000 mg | Freq: Four times a day (QID) | RECTAL | Status: DC | PRN

## 2022-10-09 NOTE — Assessment & Plan Note (Signed)
QT 553. -Check magnesium

## 2022-10-09 NOTE — H&P (Addendum)
History and Physical    Chase Chase UEA:[id number] DOB: 07-Mar-1922 DOA: 10/09/2022  PCP: Sharilyn Sites, MD   Patient coming from: Home  I have personally briefly reviewed patient's old medical records in [place]  Chief Complaint: Fall  HPI: Chase Chase is a 88 y.o. male with medical history significant for Complete heart block, pacemaker status, hypothyroidism. Patient was brought to the ED via EMS with reports of a fall and with confusion since fall.  At the time of my evaluation, patient's daughter Chase Chase is at bedside.  Patient is able to answer some questions, he is a little bit confused, daughter assists with the history.  Patient lives alone.  He was last seen at about 9:30 PM last night and he fell subsequently sometime after that in the kitchen.  Patient ambulates with a walker. He turned abruptly and ended up on the floor.  He was unable to get up and was found on the floor this morning.  He has a history of frequent falls but normally he is able to get up.  Daughter states he is a little bit confused, making some off-the-wall statements but this has improved since this morning. He was diagnosed with a UTI 4 days ago- 10/26.  Daughter said he was weak.  Has frequent UTIs.  UA was consistent with UTI, so he was started on Macrobid.  He has had 1 dose.  Patient denies dysuria to me, unable to rule out other urinary symptoms as his speech is a bit confused.  ED Course: Temperature 97.8.  Heart rate 60s to 70s.  Respiratory rate 15-24.  Blood pressure systolic  96 - 478.  WBC 23.1.  CK elevated at 7823.  EKG showing sinus rhythm. Liver enzymes are elevated. Imaging included head maxillofacial and cervical CT -showed subcutaneous contusion/hematoma in the frontal scalp, otherwise negative for acute abnormalities. Two-view chest x-ray shows small left pleural effusion, possible minimal interstitial pulmonary edema. Right and left knee x-rays, right elbow x-ray, pelvic x-ray  -negative for acute abnormality. 1 Liter bolus given.  Hospitalist admit for rhabdomyolysis.  Review of Systems: As per HPI all other systems reviewed and negative.  Past Medical History:  Diagnosis Date   At risk of UTI    CHB (complete heart block) ([place]) 09/2019   CHB (complete heart block) ([place]) 09/2019   Glaucoma    Glaucoma    HOH (hard of hearing)    Hypercholesterolemia    Pacemaker 09/19/2019   Biotronik dual chamber PPM   Prostate disease    unspecified   Vertigo     Past Surgical History:  Procedure Laterality Date   CHOLECYSTECTOMY     HERNIA REPAIR     bilateral   PACEMAKER IMPLANT N/A 09/19/2019   Procedure: PACEMAKER IMPLANT;  Surgeon: Evans Lance, MD;  Location: [facility];  Service: Cardiovascular;  Laterality: N/A;     reports that he has quit smoking. His smoking use included cigarettes. He has a 16.00 pack-year smoking history. He quit smokeless tobacco use about 11 years ago.  His smokeless tobacco use included chew. He reports that he does not drink alcohol and does not use drugs.  No Known Allergies  Family History  Problem Relation Age of Onset   Breast cancer Sister    Lung cancer Brother    Lung cancer Brother    Lung cancer Brother    Breast cancer Sister    Breast cancer Sister     Prior  to Admission medications   Medication Sig Start Date End Date Taking? Authorizing Provider  Cholecalciferol (VITAMIN D3) 125 MCG (5000 UT) CAPS Take 5,000 Units by mouth See admin instructions. Take 1 capsule by mouth for 10 days every month   Yes [provider]  Diphenhyd-Hydrocort-Nystatin (FIRST-DUKES MOUTHWASH MT) Use as directed in the mouth or throat.   Yes [provider]  ibuprofen (ADVIL) 400 MG tablet Take 400 mg by mouth every 4 (four) hours. 09/28/22  Yes [provider]  latanoprost (XALATAN) 0.005 % ophthalmic solution Place 1 drop into both eyes at bedtime as needed.  08/01/19  Yes [provider]   levothyroxine (SYNTHROID) 50 MCG tablet Take 50 mcg by mouth daily. 12/19/19  Yes [provider]  meclizine (ANTIVERT) 25 MG tablet Take 25 mg by mouth as needed for dizziness (rarely needs).   Yes [provider]  naproxen sodium (ALEVE) 220 MG tablet Take 220 mg by mouth daily as needed (pain).   Yes [provider]  nitrofurantoin, macrocrystal-monohydrate, (MACROBID) 100 MG capsule Take 1 capsule (100 mg total) by mouth every 12 (twelve) hours. 10/06/22  Yes McKenzie, Candee Furbish, MD  Omega-3 Fatty Acids (FISH OIL) 645 MG CAPS Take 1,290 mg by mouth daily. Take 2 capsules daily for cholesterol.   Yes [provider]  zinc gluconate 50 MG tablet Take 50 mg by mouth daily.   Yes [provider]  alfuzosin (UROXATRAL) 10 MG 24 hr tablet Take 1 tablet (10 mg total) by mouth at bedtime. Patient not taking: Reported on 10/09/2022 07/24/22   Cleon Gustin, MD  lidocaine (XYLOCAINE) 2 % solution SMARTSIG:By Mouth Patient not taking: Reported on 10/09/2022 09/25/22   [provider]  Misc Natural Products ([place]) Take 2 tablets by mouth daily.    [provider]  nitrofurantoin, macrocrystal-monohydrate, (MACROBID) 100 MG capsule Take 1 capsule (100 mg total) by mouth every 12 (twelve) hours. Patient not taking: Reported on 10/06/2022 08/07/22   Cleon Gustin, MD  ondansetron (ZOFRAN) 4 MG tablet Take 1 tablet (4 mg total) by mouth daily as needed for nausea or vomiting. Patient not taking: Reported on 10/06/2022 05/28/22 05/28/23  Heath Lark D, DO    Physical Exam: Vitals:   10/09/22 1330 10/09/22 1400 10/09/22 1430 10/09/22 1500  BP: 107/62 104/62 105/66 (!) 110/59  Pulse: 71 65 71   Resp: 19 (!) 23 15 (!) 24  Temp:      TempSrc:      SpO2: 96% 94% 94%   Weight:      Height:        Constitutional: calm, comfortable Vitals:   10/09/22 1330 10/09/22 1400 10/09/22 1430 10/09/22 1500  BP: 107/62 104/62  105/66 (!) 110/59  Pulse: 71 65 71   Resp: 19 (!) 23 15 (!) 24  Temp:      TempSrc:      SpO2: 96% 94% 94%   Weight:      Height:       Eyes: Bruising to left eye upper and lower, left brow area, left frontal aspect of face, with swelling, PERRL, ENMT: Mucous membranes are dry.  Neck: normal, supple, no masses, no thyromegaly Respiratory: clear to auscultation bilaterally, no wheezing, no crackles. Normal respiratory effort. No accessory muscle use.  Cardiovascular: Regular rate and rhythm, no murmurs / rubs / gallops. No extremity edema.  Abdomen: no tenderness, no masses palpated. No hepatosplenomegaly. Bowel sounds positive.  Musculoskeletal: no clubbing /  cyanosis. No joint deformity upper and lower extremities. Skin: no rashes, lesions, ulcers. No induration Neurologic: 5/5 strength in all extremities.  No apparent cranial nerve abnormality Psychiatric: Normal judgment and insight. Alert and oriented person and place speech intermittently confused.   Labs on Admission: I have personally reviewed following labs and imaging studies  CBC: Recent Labs  Lab 10/09/22 1415  WBC 23.1*  HGB 15.3  HCT 44.2  MCV 99.1  PLT 503   Basic Metabolic Panel: Recent Labs  Lab 10/09/22 1415  NA 139  K 4.3  CL 102  CO2 26  GLUCOSE 150*  BUN 45*  CREATININE 1.61*  CALCIUM 10.2   GFR: Estimated Creatinine Clearance: 23.9 mL/min (A) (by C-G formula based on SCr of 1.61 mg/dL (H)). Liver Function Tests: Recent Labs  Lab 10/09/22 1415  AST 208*  ALT 49*  ALKPHOS 80  BILITOT 1.5*  PROT 7.0  ALBUMIN 3.6   Cardiac Enzymes: Recent Labs  Lab 10/09/22 1415  CKTOTAL 7,823*   Urine analysis:    Component Value Date/Time   COLORURINE AMBER (A) 05/25/2022 1649   APPEARANCEUR Cloudy (A) 10/06/2022 1214   LABSPEC 1.011 05/25/2022 1649   PHURINE 5.0 05/25/2022 1649   GLUCOSEU Negative 10/06/2022 1214   HGBUR MODERATE (A) 05/25/2022 1649   BILIRUBINUR Negative 10/06/2022 [place] 05/25/2022 1649   PROTEINUR 2+ (A) 10/06/2022 1214   PROTEINUR 30 (A) 05/25/2022 1649   UROBILINOGEN 0.2 04/08/2013 1815   NITRITE Negative 10/06/2022 1214   NITRITE NEGATIVE 05/25/2022 1649   LEUKOCYTESUR 3+ (A) 10/06/2022 1214   LEUKOCYTESUR LARGE (A) 05/25/2022 1649    Radiological Exams on Admission: DG Chest 2 View  Result Date: 10/09/2022 CLINICAL DATA:  Status post fall. EXAM: CHEST - 2 VIEW COMPARISON:  AP chest 04/06/2021 and chest two views 09/20/2019 FINDINGS: Left chest wall cardiac pacer is again seen with leads overlying the right atrium right ventricle. Cardiac silhouette appears mildly enlarged. Mediastinal contours are unchanged and within normal limits with mild-to-moderate calcification within the aortic arch. Small left pleural effusion. Mild left basilar interstitial thickening. Minimal bilateral diffuse interstitial thickening. No pneumothorax is seen. Mild dextrocurvature of the midthoracic spine. Cholecystectomy clips. IMPRESSION: 1. Small left pleural effusion. 2. Mild left basilar interstitial thickening which could represent minimal interstitial pulmonary edema. 3. Cardiac silhouette is again mildly enlarged. Electronically Signed   By: Yvonne Kendall M.D.   On: 10/09/2022 16:00   DG Knee Complete 4 Views Right  Result Date: 10/09/2022 CLINICAL DATA:  Right knee pain status post fall EXAM: RIGHT KNEE - COMPLETE 4+ VIEW COMPARISON:  None Available. FINDINGS: Severe medial compartment joint space narrowing. Mild-to-moderate lateral and mild medial compartment chondrocalcinosis. Minimal inferior patellar degenerative osteophytosis. No joint effusion. No acute fracture is seen. No dislocation. IMPRESSION: 1. No acute fracture. 2. Severe medial compartment osteoarthritis. Electronically Signed   By: Yvonne Kendall M.D.   On: 10/09/2022 15:20   DG Knee Complete 4 Views Left  Result Date: 10/09/2022 CLINICAL DATA:  Left knee pain status post fall EXAM:  LEFT KNEE - COMPLETE 4+ VIEW COMPARISON:  None Available. FINDINGS: Moderate medial compartment joint space narrowing with mild-to-moderate peripheral degenerative osteophytosis. Mild inferior greater than superior patellar degenerative osteophytosis. Tiny joint effusion. No acute fracture is seen. No dislocation. There are two adjacent 10 mm and 9 mm ossicles overlying the posteromedial soft tissues at the level of the proximal tibia, possibly within a Baker's cyst. Mild-to-moderate vascular calcifications. IMPRESSION: 1. No  acute fracture. 2. Moderate medial compartment and mild patellofemoral compartment osteoarthritis. 3. There are two possible loose bodies within a posterior knee Baker's cyst. Electronically Signed   By: Yvonne Kendall M.D.   On: 10/09/2022 15:18   DG Elbow 2 Views Right  Result Date: 10/09/2022 CLINICAL DATA:  Status post fall. EXAM: RIGHT ELBOW - 2 VIEW COMPARISON:  None Available. FINDINGS: There is normal positioning of the central humeral fat pad without an elbow joint effusion seen. No acute fracture line is seen in the provided frontal and lateral views. Minimal medial elbow joint space narrowing with associated peripheral trochlear and coronoid process degenerative spurring. No dislocation. IMPRESSION: 1. No acute fracture is seen. 2. Mild medial elbow osteoarthritis. Electronically Signed   By: Yvonne Kendall M.D.   On: 10/09/2022 15:16   DG Pelvis 1-2 Views  Result Date: 10/09/2022 CLINICAL DATA:  Fall. EXAM: PELVIS - 1-2 VIEW COMPARISON:  CT abdomen and pelvis 05/25/2022 FINDINGS: There is diffuse decreased bone mineralization. Mild bilateral sacroiliac subchondral sclerosis without joint space narrowing. The pubic symphysis joint space is maintained. No acute fracture is seen. No dislocation. Mild bilateral superolateral acetabular degenerative osteophytosis. Moderate stool within the rectum. IMPRESSION: 1. No acute fracture. 2. Mild bilateral hip osteoarthritis.  Electronically Signed   By: Yvonne Kendall M.D.   On: 10/09/2022 15:14   CT Maxillofacial Wo Contrast  Result Date: 10/09/2022 CLINICAL DATA:  Provided history: Facial trauma, blunt. Neck trauma. Head trauma, minor. Fall. Left periorbital and facial swelling. EXAM: CT MAXILLOFACIAL WITHOUT CONTRAST CT CERVICAL SPINE WITHOUT CONTRAST TECHNIQUE: Multidetector CT imaging of the cervical spine and maxillofacial structures was performed using the standard protocol without intravenous contrast. Multiplanar CT image reconstructions of the cervical spine and maxillofacial structures were also generated. RADIATION DOSE REDUCTION: This exam was performed according to the departmental dose-optimization program which includes automated exposure control, adjustment of the mA and/or kV according to patient size and/or use of iterative reconstruction technique. COMPARISON:  Cervical spine CT 01/04/2019.  Head CT 09/13/2021. FINDINGS: CT MAXILLOFACIAL FINDINGS Osseous: No acute maxillofacial fracture is identified. Orbits: Forehead, left facial and left periorbital soft tissue swelling/hematoma. Sinuses: 14 mm mucous retention cyst, and background mild mucosal thickening, within the right maxillary sinus. Minimal mucosal thickening versus small mucous retention cyst within the left maxillary sinus. Minimal mucosal thickening within the bilateral sphenoid sinuses. Soft tissues: Forehead, left facial and left periorbital soft tissue swelling/hematoma. CT CERVICAL SPINE FINDINGS Alignment: Nonspecific reversal of the expected cervical lordosis. Dextrocurvature of the cervical and upper thoracic spine, centered at the cervicothoracic junction. No significant spondylolisthesis. Skull base and vertebrae: The basion-dental and atlanto-dental intervals are maintained.No evidence of acute fracture to the cervical spine. Soft tissues and spinal canal: No prevertebral fluid or swelling. No visible canal hematoma. Disc levels: Cervical  spondylosis with multilevel disc space narrowing, disc bulges, posterior disc osteophyte complexes, endplate spurring, uncovertebral hypertrophy and facet arthrosis. Vertebral ankylosis at C4-C5, C5-C6 and C6-C7. No appreciable high-grade spinal canal stenosis. Multilevel bony neural foraminal narrowing. Degenerative changes are also present about the C1-C2 articulation. Upper chest: No consolidation within the imaged lung apices. No visible pneumothorax. IMPRESSION: Maxillofacial CT: 1. No acute maxillofacial fracture is identified. 2. Forehead, left facial and left periorbital soft tissue swelling/hematoma. 3. Paranasal sinus disease, as described. CT cervical spine: 1. No evidence of acute fracture to the cervical spine. 2. Nonspecific reversal of the expected cervical lordosis. 3. Dextrocurvature of the cervical and upper thoracic spine, possibly positional. 4. Cervical spondylosis  and multilevel vertebral ankylosis, as described. Electronically Signed   By: Kellie Simmering D.O.   On: 10/09/2022 14:58   CT Cervical Spine Wo Contrast  Result Date: 10/09/2022 CLINICAL DATA:  Provided history: Facial trauma, blunt. Neck trauma. Head trauma, minor. Fall. Left periorbital and facial swelling. EXAM: CT MAXILLOFACIAL WITHOUT CONTRAST CT CERVICAL SPINE WITHOUT CONTRAST TECHNIQUE: Multidetector CT imaging of the cervical spine and maxillofacial structures was performed using the standard protocol without intravenous contrast. Multiplanar CT image reconstructions of the cervical spine and maxillofacial structures were also generated. RADIATION DOSE REDUCTION: This exam was performed according to the departmental dose-optimization program which includes automated exposure control, adjustment of the mA and/or kV according to patient size and/or use of iterative reconstruction technique. COMPARISON:  Cervical spine CT 01/04/2019.  Head CT 09/13/2021. FINDINGS: CT MAXILLOFACIAL FINDINGS Osseous: No acute maxillofacial  fracture is identified. Orbits: Forehead, left facial and left periorbital soft tissue swelling/hematoma. Sinuses: 14 mm mucous retention cyst, and background mild mucosal thickening, within the right maxillary sinus. Minimal mucosal thickening versus small mucous retention cyst within the left maxillary sinus. Minimal mucosal thickening within the bilateral sphenoid sinuses. Soft tissues: Forehead, left facial and left periorbital soft tissue swelling/hematoma. CT CERVICAL SPINE FINDINGS Alignment: Nonspecific reversal of the expected cervical lordosis. Dextrocurvature of the cervical and upper thoracic spine, centered at the cervicothoracic junction. No significant spondylolisthesis. Skull base and vertebrae: The basion-dental and atlanto-dental intervals are maintained.No evidence of acute fracture to the cervical spine. Soft tissues and spinal canal: No prevertebral fluid or swelling. No visible canal hematoma. Disc levels: Cervical spondylosis with multilevel disc space narrowing, disc bulges, posterior disc osteophyte complexes, endplate spurring, uncovertebral hypertrophy and facet arthrosis. Vertebral ankylosis at C4-C5, C5-C6 and C6-C7. No appreciable high-grade spinal canal stenosis. Multilevel bony neural foraminal narrowing. Degenerative changes are also present about the C1-C2 articulation. Upper chest: No consolidation within the imaged lung apices. No visible pneumothorax. IMPRESSION: Maxillofacial CT: 1. No acute maxillofacial fracture is identified. 2. Forehead, left facial and left periorbital soft tissue swelling/hematoma. 3. Paranasal sinus disease, as described. CT cervical spine: 1. No evidence of acute fracture to the cervical spine. 2. Nonspecific reversal of the expected cervical lordosis. 3. Dextrocurvature of the cervical and upper thoracic spine, possibly positional. 4. Cervical spondylosis and multilevel vertebral ankylosis, as described. Electronically Signed   By: Kellie Simmering D.O.    On: 10/09/2022 14:58   CT Head Wo Contrast  Result Date: 10/09/2022 CLINICAL DATA:  Trauma, fall EXAM: CT HEAD WITHOUT CONTRAST TECHNIQUE: Contiguous axial images were obtained from the base of the skull through the vertex without intravenous contrast. RADIATION DOSE REDUCTION: This exam was performed according to the departmental dose-optimization program which includes automated exposure control, adjustment of the mA and/or kV according to patient size and/or use of iterative reconstruction technique. COMPARISON:  09/13/2021 FINDINGS: Brain: No acute intracranial findings are seen. There are no signs of bleeding within the cranium. Cortical sulci are prominent. There is decreased density in periventricular white matter. Small old infarct is seen in the left occipital lobe. Dense calcifications in the falx and left frontal region appear stable. Vascular: There are scattered arterial calcifications. Skull: No recent fracture is seen. There is subcutaneous contusion/hematoma in the frontal scalp. Sinuses/Orbits: Unremarkable. Other: None. IMPRESSION: No acute intracranial findings are seen. Atrophy. 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 No significant interval changes are noted. There is subcutaneous contusion/hematoma in the frontal scalp. No fracture is seen in calvarium. Electronically Signed   By:  Elmer Picker M.D.   On: 10/09/2022 14:44    EKG: Independently reviewed.  Sinus rhythm rate 78.  Old RBBB.  QTc prolonged 553.  Significantly from prior.  Assessment/Plan Principal Problem:   Rhabdomyolysis Active Problems:   Acute metabolic encephalopathy   Liver enzyme elevation   CHB (complete heart block) ([facility])   Pacemaker   Prolonged QT interval  Assessment and Plan: * Rhabdomyolysis Status post mechanical fall.  History of falls.  Ambulates with walker.  Fell yesterday found on the floor today.  CK 7823. -Trend CK -1 L bolus given, continue L/R 100cc/hr x [id number]  Acute  metabolic encephalopathy Intermittent confusion.  Patient found on the floor, presenting with mild rhabdo.  History of falls ambulates with walker.  Recent UTI diagnosis, got 1 dose of Macrobid.  Leukocytosis of 23.1.  Rules out for sepsis.  Chest x-ray clear.  UA with positive leukocytes.  -Follow-up urine cultures -IV ceftriaxone 1 g daily -Hydrate -PT eval  Liver enzyme elevation For the past 4 months, liver enzymes have been mildly elevated, AST 208, ALT 49, T. bili 1.5.  Not on statins.  Benign abdominal exam. -CT renal stone study 05/2022- Hepatobiliary: No focal liver abnormality is seen. Status post cholecystectomy. No biliary dilatation. -Trend liver enzymes for now  Prolonged QT interval QT 553. -Check magnesium   DVT prophylaxis: Heparin Code Status: Full code-confirmed with daughter at bedside. Family Communication: Daughter Chase Chase is at bedside, she is HCPOA. Disposition Plan: ~ 2 days Consults called: None Admission status:  Obs tele     Author: Bethena Roys, MD 10/09/2022 8:35 PM  For on call review [email or web address].

## 2022-10-09 NOTE — ED Triage Notes (Addendum)
Pt brought in by [facility] from home with c/o fall from possibly last night. Family reports they believe he fell sometime before 2230 last night. Pt is currently on antibiotic for UTI. Hx of falls and usually able to get up after the fall, but today he wasn't able to. He has swelling to left side of face, c/o pain to right arm, AMS since fall. Normally A&O x 4. C-collar in place by EMS. No known blood thinners. BP 115/60, HR 75, O2 sat 95% per EMS.

## 2022-10-09 NOTE — Assessment & Plan Note (Signed)
Intermittent confusion.  Patient found on the floor, presenting with mild rhabdo.  History of falls ambulates with walker.  Recent UTI diagnosis, got 1 dose of Macrobid.  Leukocytosis of 23.1.  Rules out for sepsis.  Chest x-ray clear.  UA with positive leukocytes.  -Follow-up urine cultures -IV ceftriaxone 1 g daily -Hydrate -PT eval

## 2022-10-09 NOTE — ED Notes (Signed)
Bladder scan showed 310 mls. Nurse notified.

## 2022-10-09 NOTE — ED Provider Notes (Signed)
[facility] EMERGENCY DEPARTMENT Provider Note   CSN: [id number] Arrival date & time: 10/09/22  1149     History  Chief Complaint  Patient presents with  . Fall    Chase Chase is a 88 y.o. male.  88 year old male with a history of complete heart block status post pacemaker, vertigo, and frequent UTIs who presents emergency department with fall.  Patient states that he was reaching for something last night when he fell.  Denies any chest pain, shortness of breath, or dizziness preceding it.  Says that he did not lose consciousness.  Per his daughter he was found on the floor today in the kitchen and it appeared that he had rolled over.  Says that he has been somewhat confused since.  Was prescribed nitrofurantoin for UTI recently.  Did have facial trauma and swelling around his left eye but he denies any eye pain or vision changes out of his left eye at this time.  Also reported right elbow pain as well.  Has not been ambulatory since.  Not on blood thinners per daughter.   Past Medical History:  Diagnosis Date  . At risk of UTI   . CHB (complete heart block) ([place]) 09/2019  . CHB (complete heart block) ([place]) 09/2019  . Glaucoma   . Glaucoma   . HOH (hard of hearing)   . Hypercholesterolemia   . Pacemaker 09/19/2019   Biotronik dual chamber PPM  . Prostate disease    unspecified  . Vertigo       Home Medications Prior to Admission medications   Medication Sig Start Date End Date Taking? Authorizing Provider  Cholecalciferol (VITAMIN D3) 125 MCG (5000 UT) CAPS Take 5,000 Units by mouth See admin instructions. Take 1 capsule by mouth for 10 days every month   Yes [provider]  Diphenhyd-Hydrocort-Nystatin (FIRST-DUKES MOUTHWASH MT) Use as directed in the mouth or throat.   Yes [provider]  ibuprofen (ADVIL) 400 MG tablet Take 400 mg by mouth every 4 (four) hours. 09/28/22  Yes [provider]  latanoprost (XALATAN) 0.005 % ophthalmic  solution Place 1 drop into both eyes at bedtime as needed.  08/01/19  Yes [provider]  levothyroxine (SYNTHROID) 50 MCG tablet Take 50 mcg by mouth daily. 12/19/19  Yes [provider]  meclizine (ANTIVERT) 25 MG tablet Take 25 mg by mouth as needed for dizziness (rarely needs).   Yes [provider]  naproxen sodium (ALEVE) 220 MG tablet Take 220 mg by mouth daily as needed (pain).   Yes [provider]  nitrofurantoin, macrocrystal-monohydrate, (MACROBID) 100 MG capsule Take 1 capsule (100 mg total) by mouth every 12 (twelve) hours. 10/06/22  Yes McKenzie, Candee Furbish, MD  Omega-3 Fatty Acids (FISH OIL) 645 MG CAPS Take 1,290 mg by mouth daily. Take 2 capsules daily for cholesterol.   Yes [provider]  zinc gluconate 50 MG tablet Take 50 mg by mouth daily.   Yes [provider]  alfuzosin (UROXATRAL) 10 MG 24 hr tablet Take 1 tablet (10 mg total) by mouth at bedtime. Patient not taking: Reported on 10/09/2022 07/24/22   Cleon Gustin, MD  lidocaine (XYLOCAINE) 2 % solution SMARTSIG:By Mouth Patient not taking: Reported on 10/09/2022 09/25/22   [provider]  Misc Natural Products ([place]) Take 2 tablets by mouth daily.    [provider]  nitrofurantoin, macrocrystal-monohydrate, (MACROBID) 100 MG capsule Take 1 capsule (100 mg total) by mouth every 12 (  twelve) hours. Patient not taking: Reported on 10/06/2022 08/07/22   Cleon Gustin, MD  ondansetron (ZOFRAN) 4 MG tablet Take 1 tablet (4 mg total) by mouth daily as needed for nausea or vomiting. Patient not taking: Reported on 10/06/2022 05/28/22 05/28/23  Heath Lark D, DO      Allergies    Patient has no known allergies.    Review of Systems   Review of Systems  Physical Exam Updated Vital Signs BP (!) 110/59   Pulse 71   Temp 97.8 F (36.6 C) (Oral)   Resp (!) 24   Ht '5\' 7"'$  (1.702 m)   Wt 74.8 kg   SpO2 94%   BMI 25.83 kg/m   Physical Exam Vitals and nursing note reviewed.  Constitutional:      General: He is not in acute distress.    Appearance: He is well-developed.  HENT:     Head: Normocephalic.     Comments: Swelling over L eye    Right Ear: External ear normal.     Left Ear: External ear normal.     Nose: Nose normal.  Eyes:     Extraocular Movements: Extraocular movements intact.     Conjunctiva/sclera: Conjunctivae normal.     Pupils: Pupils are equal, round, and reactive to light.     Comments: Pupils 45m BL  Neck:     Comments: C-collar in place Cardiovascular:     Rate and Rhythm: Normal rate and regular rhythm.     Heart sounds: Normal heart sounds.  Pulmonary:     Effort: Pulmonary effort is normal. No respiratory distress.     Breath sounds: Normal breath sounds.  Abdominal:     General: There is no distension.     Palpations: Abdomen is soft. There is no mass.     Tenderness: There is no abdominal tenderness. There is no guarding.  Musculoskeletal:        General: No swelling.     Right lower leg: No edema.     Left lower leg: No edema.     Comments: Abrasions to bilateral knees.  Able to fully range both lower extremities.  No significant tenderness palpation of bilateral hips.  Pelvis stable.  Compartments of the calves, thighs, and gluteal compartments are soft.  Skin:    General: Skin is warm and dry.     Capillary Refill: Capillary refill takes less than 2 seconds.  Neurological:     Mental Status: He is alert.     Comments: Cranial nerves II through XII grossly intact.  Moving all 4 extremities equally.  Intact sensation light touch in all 4 extremities.  Psychiatric:        Mood and Affect: Mood normal.        Behavior: Behavior normal.     ED Results / Procedures / Treatments   Labs (all labs ordered are listed, but only abnormal results are displayed) Labs Reviewed  CBC - Abnormal; Notable for the following components:      Result Value   WBC 23.1 (*)    MCH  34.3 (*)    All other components within normal limits  COMPREHENSIVE METABOLIC PANEL - Abnormal; Notable for the following components:   Glucose, Bld 150 (*)    BUN 45 (*)    Creatinine, Ser 1.61 (*)    AST 208 (*)    ALT 49 (*)    Total Bilirubin 1.5 (*)    GFR, Estimated 38 (*)  All other components within normal limits  CK - Abnormal; Notable for the following components:   Total CK 7,823 (*)    All other components within normal limits  URINE CULTURE  URINALYSIS, ROUTINE W REFLEX MICROSCOPIC    EKG EKG Interpretation  Date/Time:  Monday October 09 2022 12:33:47 EDT Ventricular Rate:  78 PR Interval:  229 QRS Duration: 144 QT Interval:  485 QTC Calculation: 553 R Axis:   -49 Text Interpretation: Sinus rhythm Prolonged PR interval RBBB and LAFB Left ventricular hypertrophy When compared to 09/06/21 No significant change was found Confirmed by Margaretmary Eddy [phone]) on 10/09/2022 2:12:32 PM  Radiology DG Chest 2 View  Result Date: 10/09/2022 CLINICAL DATA:  Status post fall. EXAM: CHEST - 2 VIEW COMPARISON:  AP chest 04/06/2021 and chest two views 09/20/2019 FINDINGS: Left chest wall cardiac pacer is again seen with leads overlying the right atrium right ventricle. Cardiac silhouette appears mildly enlarged. Mediastinal contours are unchanged and within normal limits with mild-to-moderate calcification within the aortic arch. Small left pleural effusion. Mild left basilar interstitial thickening. Minimal bilateral diffuse interstitial thickening. No pneumothorax is seen. Mild dextrocurvature of the midthoracic spine. Cholecystectomy clips. IMPRESSION: 1. Small left pleural effusion. 2. Mild left basilar interstitial thickening which could represent minimal interstitial pulmonary edema. 3. Cardiac silhouette is again mildly enlarged. Electronically Signed   By: Yvonne Kendall M.D.   On: 10/09/2022 16:00   DG Knee Complete 4 Views Right  Result Date: 10/09/2022 CLINICAL DATA:   Right knee pain status post fall EXAM: RIGHT KNEE - COMPLETE 4+ VIEW COMPARISON:  None Available. FINDINGS: Severe medial compartment joint space narrowing. Mild-to-moderate lateral and mild medial compartment chondrocalcinosis. Minimal inferior patellar degenerative osteophytosis. No joint effusion. No acute fracture is seen. No dislocation. IMPRESSION: 1. No acute fracture. 2. Severe medial compartment osteoarthritis. Electronically Signed   By: Yvonne Kendall M.D.   On: 10/09/2022 15:20   DG Knee Complete 4 Views Left  Result Date: 10/09/2022 CLINICAL DATA:  Left knee pain status post fall EXAM: LEFT KNEE - COMPLETE 4+ VIEW COMPARISON:  None Available. FINDINGS: Moderate medial compartment joint space narrowing with mild-to-moderate peripheral degenerative osteophytosis. Mild inferior greater than superior patellar degenerative osteophytosis. Tiny joint effusion. No acute fracture is seen. No dislocation. There are two adjacent 10 mm and 9 mm ossicles overlying the posteromedial soft tissues at the level of the proximal tibia, possibly within a Baker's cyst. Mild-to-moderate vascular calcifications. IMPRESSION: 1. No acute fracture. 2. Moderate medial compartment and mild patellofemoral compartment osteoarthritis. 3. There are two possible loose bodies within a posterior knee Baker's cyst. Electronically Signed   By: Yvonne Kendall M.D.   On: 10/09/2022 15:18   DG Elbow 2 Views Right  Result Date: 10/09/2022 CLINICAL DATA:  Status post fall. EXAM: RIGHT ELBOW - 2 VIEW COMPARISON:  None Available. FINDINGS: There is normal positioning of the central humeral fat pad without an elbow joint effusion seen. No acute fracture line is seen in the provided frontal and lateral views. Minimal medial elbow joint space narrowing with associated peripheral trochlear and coronoid process degenerative spurring. No dislocation. IMPRESSION: 1. No acute fracture is seen. 2. Mild medial elbow osteoarthritis. Electronically  Signed   By: Yvonne Kendall M.D.   On: 10/09/2022 15:16   DG Pelvis 1-2 Views  Result Date: 10/09/2022 CLINICAL DATA:  Fall. EXAM: PELVIS - 1-2 VIEW COMPARISON:  CT abdomen and pelvis 05/25/2022 FINDINGS: There is diffuse decreased bone mineralization. Mild bilateral sacroiliac subchondral sclerosis  without joint space narrowing. The pubic symphysis joint space is maintained. No acute fracture is seen. No dislocation. Mild bilateral superolateral acetabular degenerative osteophytosis. Moderate stool within the rectum. IMPRESSION: 1. No acute fracture. 2. Mild bilateral hip osteoarthritis. Electronically Signed   By: Yvonne Kendall M.D.   On: 10/09/2022 15:14   CT Maxillofacial Wo Contrast  Result Date: 10/09/2022 CLINICAL DATA:  Provided history: Facial trauma, blunt. Neck trauma. Head trauma, minor. Fall. Left periorbital and facial swelling. EXAM: CT MAXILLOFACIAL WITHOUT CONTRAST CT CERVICAL SPINE WITHOUT CONTRAST TECHNIQUE: Multidetector CT imaging of the cervical spine and maxillofacial structures was performed using the standard protocol without intravenous contrast. Multiplanar CT image reconstructions of the cervical spine and maxillofacial structures were also generated. RADIATION DOSE REDUCTION: This exam was performed according to the departmental dose-optimization program which includes automated exposure control, adjustment of the mA and/or kV according to patient size and/or use of iterative reconstruction technique. COMPARISON:  Cervical spine CT 01/04/2019.  Head CT 09/13/2021. FINDINGS: CT MAXILLOFACIAL FINDINGS Osseous: No acute maxillofacial fracture is identified. Orbits: Forehead, left facial and left periorbital soft tissue swelling/hematoma. Sinuses: 14 mm mucous retention cyst, and background mild mucosal thickening, within the right maxillary sinus. Minimal mucosal thickening versus small mucous retention cyst within the left maxillary sinus. Minimal mucosal thickening within the  bilateral sphenoid sinuses. Soft tissues: Forehead, left facial and left periorbital soft tissue swelling/hematoma. CT CERVICAL SPINE FINDINGS Alignment: Nonspecific reversal of the expected cervical lordosis. Dextrocurvature of the cervical and upper thoracic spine, centered at the cervicothoracic junction. No significant spondylolisthesis. Skull base and vertebrae: The basion-dental and atlanto-dental intervals are maintained.No evidence of acute fracture to the cervical spine. Soft tissues and spinal canal: No prevertebral fluid or swelling. No visible canal hematoma. Disc levels: Cervical spondylosis with multilevel disc space narrowing, disc bulges, posterior disc osteophyte complexes, endplate spurring, uncovertebral hypertrophy and facet arthrosis. Vertebral ankylosis at C4-C5, C5-C6 and C6-C7. No appreciable high-grade spinal canal stenosis. Multilevel bony neural foraminal narrowing. Degenerative changes are also present about the C1-C2 articulation. Upper chest: No consolidation within the imaged lung apices. No visible pneumothorax. IMPRESSION: Maxillofacial CT: 1. No acute maxillofacial fracture is identified. 2. Forehead, left facial and left periorbital soft tissue swelling/hematoma. 3. Paranasal sinus disease, as described. CT cervical spine: 1. No evidence of acute fracture to the cervical spine. 2. Nonspecific reversal of the expected cervical lordosis. 3. Dextrocurvature of the cervical and upper thoracic spine, possibly positional. 4. Cervical spondylosis and multilevel vertebral ankylosis, as described. Electronically Signed   By: Kellie Simmering D.O.   On: 10/09/2022 14:58   CT Cervical Spine Wo Contrast  Result Date: 10/09/2022 CLINICAL DATA:  Provided history: Facial trauma, blunt. Neck trauma. Head trauma, minor. Fall. Left periorbital and facial swelling. EXAM: CT MAXILLOFACIAL WITHOUT CONTRAST CT CERVICAL SPINE WITHOUT CONTRAST TECHNIQUE: Multidetector CT imaging of the cervical spine and  maxillofacial structures was performed using the standard protocol without intravenous contrast. Multiplanar CT image reconstructions of the cervical spine and maxillofacial structures were also generated. RADIATION DOSE REDUCTION: This exam was performed according to the departmental dose-optimization program which includes automated exposure control, adjustment of the mA and/or kV according to patient size and/or use of iterative reconstruction technique. COMPARISON:  Cervical spine CT 01/04/2019.  Head CT 09/13/2021. FINDINGS: CT MAXILLOFACIAL FINDINGS Osseous: No acute maxillofacial fracture is identified. Orbits: Forehead, left facial and left periorbital soft tissue swelling/hematoma. Sinuses: 14 mm mucous retention cyst, and background mild mucosal thickening, within the right maxillary sinus. Minimal  mucosal thickening versus small mucous retention cyst within the left maxillary sinus. Minimal mucosal thickening within the bilateral sphenoid sinuses. Soft tissues: Forehead, left facial and left periorbital soft tissue swelling/hematoma. CT CERVICAL SPINE FINDINGS Alignment: Nonspecific reversal of the expected cervical lordosis. Dextrocurvature of the cervical and upper thoracic spine, centered at the cervicothoracic junction. No significant spondylolisthesis. Skull base and vertebrae: The basion-dental and atlanto-dental intervals are maintained.No evidence of acute fracture to the cervical spine. Soft tissues and spinal canal: No prevertebral fluid or swelling. No visible canal hematoma. Disc levels: Cervical spondylosis with multilevel disc space narrowing, disc bulges, posterior disc osteophyte complexes, endplate spurring, uncovertebral hypertrophy and facet arthrosis. Vertebral ankylosis at C4-C5, C5-C6 and C6-C7. No appreciable high-grade spinal canal stenosis. Multilevel bony neural foraminal narrowing. Degenerative changes are also present about the C1-C2 articulation. Upper chest: No consolidation  within the imaged lung apices. No visible pneumothorax. IMPRESSION: Maxillofacial CT: 1. No acute maxillofacial fracture is identified. 2. Forehead, left facial and left periorbital soft tissue swelling/hematoma. 3. Paranasal sinus disease, as described. CT cervical spine: 1. No evidence of acute fracture to the cervical spine. 2. Nonspecific reversal of the expected cervical lordosis. 3. Dextrocurvature of the cervical and upper thoracic spine, possibly positional. 4. Cervical spondylosis and multilevel vertebral ankylosis, as described. Electronically Signed   By: Kellie Simmering D.O.   On: 10/09/2022 14:58   CT Head Wo Contrast  Result Date: 10/09/2022 CLINICAL DATA:  Trauma, fall EXAM: CT HEAD WITHOUT CONTRAST TECHNIQUE: Contiguous axial images were obtained from the base of the skull through the vertex without intravenous contrast. RADIATION DOSE REDUCTION: This exam was performed according to the departmental dose-optimization program which includes automated exposure control, adjustment of the mA and/or kV according to patient size and/or use of iterative reconstruction technique. COMPARISON:  09/13/2021 FINDINGS: Brain: No acute intracranial findings are seen. There are no signs of bleeding within the cranium. Cortical sulci are prominent. There is decreased density in periventricular white matter. Small old infarct is seen in the left occipital lobe. Dense calcifications in the falx and left frontal region appear stable. Vascular: There are scattered arterial calcifications. Skull: No recent fracture is seen. There is subcutaneous contusion/hematoma in the frontal scalp. Sinuses/Orbits: Unremarkable. Other: None. IMPRESSION: No acute intracranial findings are seen. Atrophy. 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 No significant interval changes are noted. There is subcutaneous contusion/hematoma in the frontal scalp. No fracture is seen in calvarium. Electronically Signed   By: Elmer Picker M.D.   On: 10/09/2022 14:44    Procedures Procedures   Medications Ordered in ED Medications  lactated ringers bolus 1,000 mL (has no administration in time range)    ED Course/ Medical Decision Making/ A&P Clinical Course as of 10/09/22 1619  Mon Oct 09, 2022  1600 Creatinine(!): 1.61 Baseline of 0.88 four months ago.  [RP]  1618 Discussed with Dr Fidel Levy for admission.  [RP]    Clinical Course User Index [RP] Fransico Meadow, MD                           Medical Decision Making Amount and/or Complexity of Data Reviewed Labs: ordered. Decision-making details documented in ED Course. Radiology: ordered.  Risk Decision regarding hospitalization.   MARKEVION LATTIN is a 88 y.o. male with comorbidities that complicate the patient evaluation including complete heart block status post pacemaker, vertigo, and frequent UTIs who presents emergency department with fall.  This patient presents to  the ED for concern of complaints listed in HPI, this involves an extensive number of treatment options, and is a complaint that carries with it a high risk of complications and morbidity. Disposition including potential need for admission considered.   Initial Ddx:  Mechanical fall, syncope, seizure, TBI, C-spine injury, fracture, rhabdo  MDM:  Patient appears to have retained consciousness during his fall making syncope and seizure less likely.  Given his history of complete heart block and pacemaker did attempt to interrogate his bioTronic pacemaker but we are unable to do so at this time because of equipment issues.  Given his advanced age will scan his head for TBI, C-spine injury, or possible facial fracture.  We will also obtain lab work to evaluate for rhabdo and extremity fractures.  Based on prior culture results his ET callus UTIs have been sensitive to [place] so feel that he is adequately treated at this time and does not appear to be septic.  Plan:  Labs CK CT  head, C-spine, face X-ray of chest, extremities, pelvis  ED Summary/Re-evaluation:  Patient underwent the following work-up and was found to have mild rhabdomyolysis with AKI.  Was given 1 L of fluids and will need admission with judicious fluid repletion given the fact that his chest x-ray showed possible early onset pulmonary edema.  We will also send patient's urine since his prior urine culture has not yet resulted.  Will defer treatment of UTI to medicine team.  Dispo: Admit to Floor   Additional history obtained from daughter Records reviewed Outpatient Clinic Notes and DC Summary The following labs were independently interpreted: Chemistry and show AKI I independently reviewed the following imaging with scope of interpretation limited to determining acute life threatening conditions related to emergency care: CT Head, which revealed no acute abnormality  I personally reviewed and interpreted cardiac monitoring: normal sinus rhythm  I personally reviewed and interpreted the pt's EKG: see above for interpretation  I have reviewed the patients home medications and made adjustments as needed Consults: Hospitalist Social Determinants of health:  Elderly   Final Clinical Impression(s) / ED Diagnoses Final diagnoses:  Fall, initial encounter  Traumatic rhabdomyolysis, initial encounter [facility])    Rx / DC Orders ED Discharge Orders     None         Fransico Meadow, MD 10/09/22 [phone]

## 2022-10-09 NOTE — ED Notes (Signed)
Daughter has pt keys and wallet with money in it.

## 2022-10-09 NOTE — Assessment & Plan Note (Addendum)
Status post mechanical fall.  History of falls.  Ambulates with walker.  Fell yesterday found on the floor today.  CK 7823. -Trend CK -1 L bolus given, continue L/R 100cc/hr x 20hrs

## 2022-10-09 NOTE — Assessment & Plan Note (Addendum)
For the past 4 months, liver enzymes have been mildly elevated, AST 208, ALT 49, T. bili 1.5.  Not on statins.  Benign abdominal exam. -CT renal stone study 05/2022- Hepatobiliary: No focal liver abnormality is seen. Status post cholecystectomy. No biliary dilatation. -Trend liver enzymes for now

## 2022-10-09 NOTE — ED Notes (Signed)
Unable to interrogate pacemaker - Biotronik (serial number [id number])

## 2022-10-10 ENCOUNTER — Inpatient Hospital Stay (HOSPITAL_COMMUNITY): Payer: Medicare Other

## 2022-10-10 ENCOUNTER — Encounter: Payer: Self-pay | Admitting: Urology

## 2022-10-10 DIAGNOSIS — R945 Abnormal results of liver function studies: Secondary | ICD-10-CM | POA: Diagnosis not present

## 2022-10-10 DIAGNOSIS — T796XXA Traumatic ischemia of muscle, initial encounter: Secondary | ICD-10-CM | POA: Diagnosis not present

## 2022-10-10 DIAGNOSIS — Z801 Family history of malignant neoplasm of trachea, bronchus and lung: Secondary | ICD-10-CM | POA: Diagnosis not present

## 2022-10-10 DIAGNOSIS — Y92000 Kitchen of unspecified non-institutional (private) residence as  the place of occurrence of the external cause: Secondary | ICD-10-CM | POA: Diagnosis not present

## 2022-10-10 DIAGNOSIS — R222 Localized swelling, mass and lump, trunk: Secondary | ICD-10-CM | POA: Diagnosis present

## 2022-10-10 DIAGNOSIS — I442 Atrioventricular block, complete: Secondary | ICD-10-CM | POA: Diagnosis present

## 2022-10-10 DIAGNOSIS — R410 Disorientation, unspecified: Secondary | ICD-10-CM | POA: Diagnosis present

## 2022-10-10 DIAGNOSIS — N401 Enlarged prostate with lower urinary tract symptoms: Secondary | ICD-10-CM | POA: Diagnosis not present

## 2022-10-10 DIAGNOSIS — N1330 Unspecified hydronephrosis: Secondary | ICD-10-CM | POA: Diagnosis not present

## 2022-10-10 DIAGNOSIS — E039 Hypothyroidism, unspecified: Secondary | ICD-10-CM | POA: Diagnosis present

## 2022-10-10 DIAGNOSIS — N390 Urinary tract infection, site not specified: Secondary | ICD-10-CM | POA: Diagnosis not present

## 2022-10-10 DIAGNOSIS — R278 Other lack of coordination: Secondary | ICD-10-CM | POA: Diagnosis not present

## 2022-10-10 DIAGNOSIS — R488 Other symbolic dysfunctions: Secondary | ICD-10-CM | POA: Diagnosis not present

## 2022-10-10 DIAGNOSIS — S80211A Abrasion, right knee, initial encounter: Secondary | ICD-10-CM | POA: Diagnosis present

## 2022-10-10 DIAGNOSIS — Z9049 Acquired absence of other specified parts of digestive tract: Secondary | ICD-10-CM | POA: Diagnosis not present

## 2022-10-10 DIAGNOSIS — N400 Enlarged prostate without lower urinary tract symptoms: Secondary | ICD-10-CM | POA: Diagnosis present

## 2022-10-10 DIAGNOSIS — J9859 Other diseases of mediastinum, not elsewhere classified: Secondary | ICD-10-CM | POA: Diagnosis present

## 2022-10-10 DIAGNOSIS — Z9181 History of falling: Secondary | ICD-10-CM | POA: Diagnosis not present

## 2022-10-10 DIAGNOSIS — Z950 Presence of cardiac pacemaker: Secondary | ICD-10-CM | POA: Diagnosis not present

## 2022-10-10 DIAGNOSIS — N179 Acute kidney failure, unspecified: Secondary | ICD-10-CM

## 2022-10-10 DIAGNOSIS — E860 Dehydration: Secondary | ICD-10-CM | POA: Diagnosis present

## 2022-10-10 DIAGNOSIS — E8809 Other disorders of plasma-protein metabolism, not elsewhere classified: Secondary | ICD-10-CM | POA: Diagnosis present

## 2022-10-10 DIAGNOSIS — E7800 Pure hypercholesterolemia, unspecified: Secondary | ICD-10-CM | POA: Diagnosis present

## 2022-10-10 DIAGNOSIS — J9811 Atelectasis: Secondary | ICD-10-CM | POA: Diagnosis not present

## 2022-10-10 DIAGNOSIS — Y92009 Unspecified place in unspecified non-institutional (private) residence as the place of occurrence of the external cause: Secondary | ICD-10-CM | POA: Diagnosis not present

## 2022-10-10 DIAGNOSIS — H9193 Unspecified hearing loss, bilateral: Secondary | ICD-10-CM | POA: Diagnosis not present

## 2022-10-10 DIAGNOSIS — W1830XA Fall on same level, unspecified, initial encounter: Secondary | ICD-10-CM | POA: Diagnosis present

## 2022-10-10 DIAGNOSIS — G9341 Metabolic encephalopathy: Secondary | ICD-10-CM | POA: Diagnosis present

## 2022-10-10 DIAGNOSIS — Z87891 Personal history of nicotine dependence: Secondary | ICD-10-CM | POA: Diagnosis not present

## 2022-10-10 DIAGNOSIS — N200 Calculus of kidney: Secondary | ICD-10-CM | POA: Diagnosis not present

## 2022-10-10 DIAGNOSIS — Z66 Do not resuscitate: Secondary | ICD-10-CM | POA: Diagnosis present

## 2022-10-10 DIAGNOSIS — I712 Thoracic aortic aneurysm, without rupture, unspecified: Secondary | ICD-10-CM | POA: Diagnosis not present

## 2022-10-10 DIAGNOSIS — Z23 Encounter for immunization: Secondary | ICD-10-CM | POA: Diagnosis present

## 2022-10-10 DIAGNOSIS — Z741 Need for assistance with personal care: Secondary | ICD-10-CM | POA: Diagnosis not present

## 2022-10-10 DIAGNOSIS — R262 Difficulty in walking, not elsewhere classified: Secondary | ICD-10-CM | POA: Diagnosis not present

## 2022-10-10 DIAGNOSIS — M6281 Muscle weakness (generalized): Secondary | ICD-10-CM | POA: Diagnosis not present

## 2022-10-10 DIAGNOSIS — W19XXXA Unspecified fall, initial encounter: Secondary | ICD-10-CM | POA: Diagnosis not present

## 2022-10-10 DIAGNOSIS — J90 Pleural effusion, not elsewhere classified: Secondary | ICD-10-CM | POA: Diagnosis not present

## 2022-10-10 DIAGNOSIS — N132 Hydronephrosis with renal and ureteral calculous obstruction: Secondary | ICD-10-CM | POA: Diagnosis not present

## 2022-10-10 DIAGNOSIS — N136 Pyonephrosis: Secondary | ICD-10-CM | POA: Diagnosis present

## 2022-10-10 DIAGNOSIS — H409 Unspecified glaucoma: Secondary | ICD-10-CM | POA: Diagnosis not present

## 2022-10-10 DIAGNOSIS — Z79890 Hormone replacement therapy (postmenopausal): Secondary | ICD-10-CM | POA: Diagnosis not present

## 2022-10-10 DIAGNOSIS — K5730 Diverticulosis of large intestine without perforation or abscess without bleeding: Secondary | ICD-10-CM | POA: Diagnosis not present

## 2022-10-10 DIAGNOSIS — Z20822 Contact with and (suspected) exposure to covid-19: Secondary | ICD-10-CM | POA: Diagnosis present

## 2022-10-10 DIAGNOSIS — R443 Hallucinations, unspecified: Secondary | ICD-10-CM | POA: Diagnosis present

## 2022-10-10 DIAGNOSIS — N323 Diverticulum of bladder: Secondary | ICD-10-CM | POA: Diagnosis not present

## 2022-10-10 DIAGNOSIS — M6282 Rhabdomyolysis: Secondary | ICD-10-CM | POA: Diagnosis present

## 2022-10-10 DIAGNOSIS — Z79899 Other long term (current) drug therapy: Secondary | ICD-10-CM | POA: Diagnosis not present

## 2022-10-10 DIAGNOSIS — S80212A Abrasion, left knee, initial encounter: Secondary | ICD-10-CM | POA: Diagnosis present

## 2022-10-10 LAB — CK: Total CK: 4290 U/L — ABNORMAL HIGH (ref 49–397)

## 2022-10-10 LAB — CBC
HCT: 38.8 % — ABNORMAL LOW (ref 39.0–52.0)
Hemoglobin: 13.5 g/dL (ref 13.0–17.0)
MCH: 34.9 pg — ABNORMAL HIGH (ref 26.0–34.0)
MCHC: 34.8 g/dL (ref 30.0–36.0)
MCV: 100.3 fL — ABNORMAL HIGH (ref 80.0–100.0)
Platelets: 144 10*3/uL — ABNORMAL LOW (ref 150–400)
RBC: 3.87 MIL/uL — ABNORMAL LOW (ref 4.22–5.81)
RDW: 12.4 % (ref 11.5–15.5)
WBC: 18.7 10*3/uL — ABNORMAL HIGH (ref 4.0–10.5)
nRBC: 0 % (ref 0.0–0.2)

## 2022-10-10 LAB — COMPREHENSIVE METABOLIC PANEL
ALT: 48 U/L — ABNORMAL HIGH (ref 0–44)
AST: 176 U/L — ABNORMAL HIGH (ref 15–41)
Albumin: 3 g/dL — ABNORMAL LOW (ref 3.5–5.0)
Alkaline Phosphatase: 60 U/L (ref 38–126)
Anion gap: 8 (ref 5–15)
BUN: 43 mg/dL — ABNORMAL HIGH (ref 8–23)
CO2: 26 mmol/L (ref 22–32)
Calcium: 9.3 mg/dL (ref 8.9–10.3)
Chloride: 107 mmol/L (ref 98–111)
Creatinine, Ser: 1.27 mg/dL — ABNORMAL HIGH (ref 0.61–1.24)
GFR, Estimated: 51 mL/min — ABNORMAL LOW (ref >60)
Glucose, Bld: 102 mg/dL — ABNORMAL HIGH (ref 70–99)
Potassium: 3.8 mmol/L (ref 3.5–5.1)
Sodium: 141 mmol/L (ref 135–145)
Total Bilirubin: 1.1 mg/dL (ref 0.3–1.2)
Total Protein: 5.6 g/dL — ABNORMAL LOW (ref 6.5–8.1)

## 2022-10-10 LAB — URINE CULTURE

## 2022-10-10 MED ORDER — IOHEXOL 350 MG/ML SOLN
100.0000 mL | Freq: Once | INTRAVENOUS | Status: AC | PRN
  Administered 2022-10-10: 80 mL via INTRAVENOUS

## 2022-10-10 MED ORDER — ALFUZOSIN HCL ER 10 MG PO TB24
10.0000 mg | ORAL_TABLET | Freq: Every day | ORAL | Status: DC
  Administered 2022-10-11 – 2022-10-12 (×2): 10 mg via ORAL
  Filled 2022-10-10 (×2): qty 1

## 2022-10-10 MED ORDER — VANCOMYCIN HCL IN DEXTROSE 1-5 GM/200ML-% IV SOLN
1000.0000 mg | INTRAVENOUS | Status: DC
  Administered 2022-10-10: 1000 mg via INTRAVENOUS
  Filled 2022-10-10: qty 200

## 2022-10-10 MED ORDER — HALOPERIDOL LACTATE 5 MG/ML IJ SOLN
2.0000 mg | Freq: Once | INTRAMUSCULAR | Status: AC
  Administered 2022-10-11: 2 mg via INTRAMUSCULAR
  Filled 2022-10-10: qty 1

## 2022-10-10 NOTE — Progress Notes (Signed)
Tele called stated new change in patients pacemaker, noted either failure to send or failure to capture. Md Memon made aware.

## 2022-10-10 NOTE — TOC Progression Note (Signed)
  Transition of Care [facility]) Screening Note   Patient Details  Name: Chase Chase Date of Birth: 14-Oct-1922   Transition of Care [facility]) CM/SW Contact:    Boneta Lucks, RN Phone Number: 10/10/2022, 11:28 AM  Active with [facility]. TOC to follow for PT eval this admission.   Transition of Care Department [facility]) has reviewed patient and no TOC needs have been identified at this time. We will continue to monitor patient advancement through interdisciplinary progression rounds. If new patient transition needs arise, please place a TOC consult.     Expected Discharge Plan: [place] Barriers to Discharge: Continued Medical Work up  Expected Discharge Plan and Services Expected Discharge Plan: [place]

## 2022-10-10 NOTE — Progress Notes (Signed)
Pt assisted up into recliner by 2 staff members, pt required use of walker for stability and frequent redirection to stand up straight and move his feet. Short shuffling steps and did requiring blocking of feet as pt kept leaning back during transfer. Chair on on for safety, call bell placed in patient's lap. Pt with confused conversation, but able to tell me his name and DOB and that he will be 88 years old on his birthday. Family member remains at bedside.

## 2022-10-10 NOTE — Care Management Obs Status (Signed)
MEDICARE OBSERVATION STATUS NOTIFICATION   Patient Details  Name: Chase Chase MRN: [id number] Date of Birth: Apr 23, 1922   Medicare Observation Status Notification Given:  Yes    Tommy Medal 10/10/2022, 12:05 PM

## 2022-10-10 NOTE — Progress Notes (Addendum)
PROGRESS NOTE    Chase Chase  KZL:[id number] DOB: 1922/10/07 DOA: 10/09/2022 PCP: Sharilyn Sites, MD    Brief Narrative:  88 year old male who lives at home independently, family checks on him regularly and provides his meals, had a fall prior to admission where his family found him laying on the floor.  He was brought to the hospital found to have acute kidney injury with rhabdomyolysis.  There was also concern for underlying UTI.  Recent urine culture from a few days ago was positive for Enterococcus.  He has been started on antibiotics, IV fluids.  He is confused and hallucinating.   Assessment & Plan:   Principal Problem:   Rhabdomyolysis Active Problems:   Acute metabolic encephalopathy   Liver enzyme elevation   CHB (complete heart block) (HCC)   Pacemaker   Prolonged QT interval   AKI (acute kidney injury) ([place])   Mediastinal mass   Rhabdomyolysis -This post fall and immobilization on the floor -CK 7823 on admission -Improving with IV fluids, now CK down to 4290.  Continue to follow  Acute metabolic encephalopathy -Related to dehydration and UTI -Continue frequent redirection -Continue to monitor  Acute kidney injury -Creatinine 1.6 on admission -Reviewing with IV fluids down to 1.2 -Continue to monitor  Urinary tract infection -Continue on ceftriaxone -follow-up urine culture -Review of records indicate that patient does have a history of kidney stones -Check CT abdomen to rule out any obstructing stone -Add vancomycin since urine culture from 10/27 shows Enterococcus  Elevated LFTs -Discussed that this is secondary to rhabdomyolysis -Continue to trend  BPH -Restart uroxatral  Goals of care -Discussed with patient's daughter and she reports that in the past, patient had mentioned to her that he would not want heroic measures including CPR/intubation -She does report that this past June, when he was asked regarding CODE STATUS that he had requested  full measures, although she is not sure whether he fully understood what that meant -She wishes to have more conversation with him around [place] and goals of care -Palliative care consult  Addendum: -CT abdomen pelvis showed that patient had persistent nephrolithiasis and mild hydronephrosis, consistent with prior CTs.  Patient is known to Dr. Alyson Ingles, anticipate that CT can be reviewed with him tomorrow. -Additional findings included possible mass encasing thoracic aorta.  It was unclear on this noncontrast film with this was an underlying hematoma -Further evaluation was performed with CTA of chest, abdomen and pelvis that did not show any aortic aneurysm, hematoma or dissection.  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 -Updated patient's daughter regarding CT findings and their significance.  Daughter was in agreement that due to patient's advanced age and comorbidities, it would be reasonable not to pursue further work-up of this mass at this time.  We also discussed CODE STATUS since patient is unable to participate in that conversation at this time and she agreed that DNR would be the most reasonable CODE STATUS considering his age, frailty and comorbidities.  DVT prophylaxis: heparin injection 5,000 Units Start: 10/09/22 2200  Code Status: Full code Family Communication: updated daughter over the phone Disposition Plan: Status is: Inpatient The patient will require care spanning > 2 midnights and should be moved to inpatient because: Needs continued IV fluids and IV antibiotics     Consultants:    Procedures:    Antimicrobials:  Ceftriaxone   Subjective: Patient is sitting up in the chair.  Family reports that he is has had some confusion and hallucinations.  He denies any complaints at this time.  Objective: Vitals:   10/10/22 0128 10/10/22 0752 10/10/22 1439 10/10/22 1953  BP: 107/60  (!) 125/58 111/65 128/69  Pulse: 72 66 68 68  Resp: 18 20  (!) 21  Temp: 97.9 F (36.6 C) 98 F (36.7 C) 98.4 F (36.9 C) 98.5 F (36.9 C)  TempSrc: Oral Oral Oral Oral  SpO2: 96% 96% 96% 98%  Weight:      Height:        Intake/Output Summary (Last 24 hours) at 10/10/2022 2203 Last data filed at 10/10/2022 1723 Gross per 24 hour  Intake 2281.85 ml  Output 900 ml  Net 1381.85 ml   Filed Weights   10/09/22 1226 10/09/22 1743  Weight: 74.8 kg 72.7 kg    Examination:  General exam: Appears calm and comfortable HEENT: Swelling around left maxillary/left orbital area Respiratory system: Clear to auscultation. Respiratory effort normal. Cardiovascular system: S1 & S2 heard, RRR. No JVD, murmurs, rubs, gallops or clicks. No pedal edema. Gastrointestinal system: Abdomen is nondistended, soft and nontender. No organomegaly or masses felt. Normal bowel sounds heard. Central nervous system:  No focal neurological deficits. Extremities: Symmetric 5 x 5 power. Skin: No rashes, lesions or ulcers Psychiatry: Confused, pleasant    Data Reviewed: I have personally reviewed following labs and imaging studies  CBC: Recent Labs  Lab 10/09/22 1415 10/10/22 0403  WBC 23.1* 18.7*  HGB 15.3 13.5  HCT 44.2 38.8*  MCV 99.1 100.3*  PLT 165 540*   Basic Metabolic Panel: Recent Labs  Lab 10/09/22 1415 10/10/22 0403  NA 139 141  K 4.3 3.8  CL 102 107  CO2 26 26  GLUCOSE 150* 102*  BUN 45* 43*  CREATININE 1.61* 1.27*  CALCIUM 10.2 9.3  MG 2.1  --    GFR: Estimated Creatinine Clearance: 30.4 mL/min (A) (by C-G formula based on SCr of 1.27 mg/dL (H)). Liver Function Tests: Recent Labs  Lab 10/09/22 1415 10/10/22 0403  AST 208* 176*  ALT 49* 48*  ALKPHOS 80 60  BILITOT 1.5* 1.1  PROT 7.0 5.6*  ALBUMIN 3.6 3.0*   No results for input(s): "LIPASE", "AMYLASE" in the last 168 hours. No results for input(s): "AMMONIA" in the last 168 hours. Coagulation Profile: No results  for input(s): "INR", "PROTIME" in the last 168 hours. Cardiac Enzymes: Recent Labs  Lab 10/09/22 1415 10/10/22 0403  CKTOTAL 7,823* 4,290*   BNP (last 3 results) No results for input(s): "PROBNP" in the last 8760 hours. HbA1C: No results for input(s): "HGBA1C" in the last 72 hours. CBG: No results for input(s): "GLUCAP" in the last 168 hours. Lipid Profile: No results for input(s): "CHOL", "HDL", "LDLCALC", "TRIG", "CHOLHDL", "LDLDIRECT" in the last 72 hours. Thyroid Function Tests: No results for input(s): "TSH", "T4TOTAL", "FREET4", "T3FREE", "THYROIDAB" in the last 72 hours. Anemia Panel: No results for input(s): "VITAMINB12", "FOLATE", "FERRITIN", "TIBC", "IRON", "RETICCTPCT" in the last 72 hours. Sepsis Labs: No results for input(s): "PROCALCITON", "LATICACIDVEN" in the last 168 hours.  Recent Results (from the past 240 hour(s))  Microscopic Examination     Status: Abnormal   Collection Time: 10/06/22 12:14 PM   Urine  Result Value Ref Range Status   WBC, UA >30 (A) 0 - 5 /hpf Final   RBC, Urine 3-10 (A) 0 - 2 /hpf Final   Epithelial Cells (non renal) 0-10 0 - 10 /hpf Final   Casts Present (  A) None seen /lpf Final   Cast Type Hyaline casts N/A Final   Mucus, UA Present (A) Not Estab. Final   Bacteria, UA Few None seen/Few Final  Urine Culture     Status: Abnormal   Collection Time: 10/06/22 12:40 PM   Specimen: Urine   UR  Result Value Ref Range Status   Urine Culture, Routine Final report (A)  Final   Organism ID, Bacteria Enterococcus faecalis (A)  Final    Comment: For Enterococcus species, aminoglycosides (except for high-level resistance screening), cephalosporins, clindamycin, and trimethoprim-sulfamethoxazole are not effective clinically. (CLSI, M100-S26, 2016) 50,000-100,000 colony forming units per mL    ORGANISM ID, BACTERIA Comment  Final    Comment: Mixed urogenital flora Less than 10,000 colonies/mL    Antimicrobial Susceptibility Comment  Final     Comment:       ** S = Susceptible; I = Intermediate; R = Resistant **                    P = Positive; N = Negative             MICS are expressed in micrograms per mL    Antibiotic                 RSLT#1    RSLT#2    RSLT#3    RSLT#4 Ciprofloxacin                  S Levofloxacin                   S Nitrofurantoin                 S Penicillin                     S Tetracycline                   R Vancomycin                     S          Radiology Studies: CT Angio Chest/Abd/Pel for Dissection W and/or W/WO  Result Date: 10/10/2022 CLINICAL DATA:  Aortic aneurysm. EXAM: CT ANGIOGRAPHY CHEST, ABDOMEN AND PELVIS TECHNIQUE: Non-contrast CT of the chest was initially obtained. Multidetector CT imaging through the chest, abdomen and pelvis was performed using the standard protocol during bolus administration of intravenous contrast. Multiplanar reconstructed images and MIPs were obtained and reviewed to evaluate the vascular anatomy. RADIATION DOSE REDUCTION: This exam was performed according to the departmental dose-optimization program which includes automated exposure control, adjustment of the mA and/or kV according to patient size and/or use of iterative reconstruction technique. CONTRAST:  95m OMNIPAQUE IOHEXOL 350 MG/ML SOLN COMPARISON:  CT abdomen pelvis dated 10/10/2022. FINDINGS: CTA CHEST FINDINGS Cardiovascular: There is no cardiomegaly or pericardial effusion. Three-vessel coronary vascular calcification. There is mild atherosclerotic calcification of the aorta. No aneurysmal dilatation or dissection. The origins of the great vessels of the aortic arch and the central pulmonary arteries appear patent. Mediastinum/Nodes: No hilar adenopathy. The esophagus is grossly unremarkable. No mediastinal fluid collection. There is a soft tissue mass in the posterior mediastinum abutting the anterior spine and encasing the descending thoracic aorta. This mass has increased in size since the  prior CT of 04/03/2022 and measures approximately 9.3 x 7.2 cm in greatest axial dimension and 15 cm in craniocaudal length. This mass terminates at the level  of the diaphragm. There is displacement of the aorta anteriorly. This is most consistent with a posterior mediastinal mass and may represent lymphoma or a neurogenic tumor. Further evaluation with MRI without and with contrast is recommended. Lungs/Pleura: Small left and trace right pleural effusions. There are bibasilar compressive atelectasis. Pneumonia is not excluded. There is no pneumothorax. The central airways are patent. Musculoskeletal: Osteopenia with degenerative changes of the spine. Left pectoral pacemaker device. No acute osseous pathology. Review of the MIP images confirms the above findings. CTA ABDOMEN AND PELVIS FINDINGS VASCULAR Aorta: Moderate atherosclerotic calcification. No aneurysmal dilatation or dissection. No periaortic fluid collection. Celiac: Atherosclerotic calcification of the origin of the celiac trunk. The celiac artery and its major branches are patent. SMA: Atherosclerotic calcification of the origin of the SMA. The SMA is patent. Renals: The renal arteries remain patent. IMA: The IMA is patent Inflow: Atherosclerotic calcification of the iliac arteries. The iliac arteries are patent. Veins: No obvious venous abnormality within the limitations of this arterial phase study. Review of the MIP images confirms the above findings. NON-VASCULAR No intra-abdominal free air or free fluid. Hepatobiliary: There is unremarkable. Cholecystectomy. No retained calcified stone in the central CBD. Mild dilatation of the common bile duct, post cholecystectomy. Pancreas: Unremarkable. No pancreatic ductal dilatation or surrounding inflammatory changes. Spleen: Normal in size without focal abnormality. Adrenals/Urinary Tract: The adrenal glands unremarkable. Moderate bilateral renal parenchyma atrophy. There are 2 adjacent stones in the left  renal pelvis with combined length of approximately 2.3 cm. There is mild left hydronephrosis. Several additional nonobstructing left renal calculi noted. There is a 1.3 cm stone in the upper pole of the right kidney. There is a cyst in the upper pole of the right kidney versus focal pelviectasis. Additional smaller nonobstructing right renal calculi noted. Several high attenuating lesions arising from the right kidney are not characterized on this CT but may represent proteinaceous cysts. These can be better evaluated with ultrasound on a nonemergent/outpatient basis. The visualized ureters appear unremarkable. Multiple bladder diverticula noted. Stomach/Bowel: There is sigmoid diverticulosis without active inflammatory changes. There is no bowel obstruction or active inflammation. The appendix is normal. Lymphatic: No adenopathy. Reproductive: The prostate gland is enlarged measuring 6 cm in transverse axial diameter. Other: None Musculoskeletal: Osteopenia with degenerative changes of the spine. No acute osseous pathology. Review of the MIP images confirms the above findings. IMPRESSION: 1. No aortic aneurysm or dissection. 2. Interval increase in the size of the posterior mediastinal soft tissue mass encasing distal descending thoracic aorta. Differential diagnosis include lymphoma, a neurogenic tumor, or less likely extra medullary hematopoiesis. Further characterization with MRI without and with contrast recommended. 3. Small left and trace right pleural effusions with bibasilar compressive atelectasis. Pneumonia is not excluded. 4. Bilateral nonobstructing renal calculi. Mild hydronephrosis on the left. 5. Sigmoid diverticulosis without active inflammatory changes. No bowel obstruction. Normal appendix. 6. Enlarged prostate gland. Electronically Signed   By: Anner Crete M.D.   On: 10/10/2022 19:07   CT RENAL STONE STUDY  Addendum Date: 10/10/2022   ADDENDUM REPORT: 10/10/2022 16:38 ADDENDUM: These  results were called by telephone at the time of interpretation on 10/10/2022 at 4:37 pm to provider [facility] , who verbally acknowledged these results. Electronically Signed   By: Ronney Asters M.D.   On: 10/10/2022 16:38   Result Date: 10/10/2022 CLINICAL DATA:  Nephrolithiasis. EXAM: CT ABDOMEN AND PELVIS WITHOUT CONTRAST TECHNIQUE: Multidetector CT imaging of the abdomen and pelvis was performed following the standard protocol  without IV contrast. RADIATION DOSE REDUCTION: This exam was performed according to the departmental dose-optimization program which includes automated exposure control, adjustment of the mA and/or kV according to patient size and/or use of iterative reconstruction technique. COMPARISON:  CT abdomen and pelvis 05/25/2022 FINDINGS: Lower chest: Small left pleural effusion has decreased. There is a trace right pleural effusion. There is some atelectasis in both lung bases. Para-aortic heterogeneous density surrounding the entire aorta has increased in size now measuring 7.3 x 10.2 cm at the diaphragm (previously 5.1 x 4.4 cm). Hepatobiliary: No focal liver abnormality is seen. Status post cholecystectomy. No biliary dilatation. Pancreas: Unremarkable. No pancreatic ductal dilatation or surrounding inflammatory changes. Spleen: Normal in size without focal abnormality. Adrenals/Urinary Tract: Mild left hydronephrosis persists. There are 2 calculi within the left renal pelvis measuring up to 1 cm in the ureteropelvic junction, unchanged. Nonobstructing left renal calculi in the inferior pole measuring up to 4 mm are unchanged. Nonobstructing right renal calculi are present measuring up to 15 mm. Rounded hyperdense lesion in the right kidney measures 2 cm and appears unchanged, most likely proteinaceous cyst. The adrenal glands are within normal limits. Bladder diverticula are again noted.  No bladder calcifications. Stomach/Bowel: Stomach is within normal limits. No evidence of bowel  wall thickening, distention, or inflammatory changes. The appendix is not seen. There is colonic diverticulosis. Vascular/Lymphatic: There are atherosclerotic calcifications of the aorta. Aorta and IVC are normal in size. No enlarged lymph nodes. Reproductive: Prostate gland is enlarged, unchanged. Other: No ascites. Small fat containing right inguinal hernia. Injection sites are seen in the subcutaneous tissues of the anterior abdominal wall. Musculoskeletal: Degenerative changes affect the spine IMPRESSION: 1. Enlarging periaortic heterogeneous density surrounding the entire aorta concerning for hematoma. This can be further evaluated with CTA of the chest. 2. Stable mild left hydronephrosis secondary to 2 calculi in the left renal pelvis measuring up to 1 cm. 3. Bilateral nonobstructing renal calculi are unchanged. 4. Colonic diverticulosis. 5. Stable bladder diverticula. 6. Small left and trace right pleural effusions. 7. Prostatomegaly, unchanged. 8. Subcentimeter right Bosniak II renal cyst, too small to characterize. No follow-up imaging is recommended. JACR 2018 Feb; 264-273, Management of the Incidental Renal Mass on CT, [employer] 2021; [id number], Bosniak Classification of Cystic Renal Masses, Version 2019. Aortic Atherosclerosis (ICD10-I70.0). Electronically Signed: By: Ronney Asters M.D. On: 10/10/2022 16:29   DG Chest 2 View  Result Date: 10/09/2022 CLINICAL DATA:  Status post fall. EXAM: CHEST - 2 VIEW COMPARISON:  AP chest 04/06/2021 and chest two views 09/20/2019 FINDINGS: Left chest wall cardiac pacer is again seen with leads overlying the right atrium right ventricle. Cardiac silhouette appears mildly enlarged. Mediastinal contours are unchanged and within normal limits with mild-to-moderate calcification within the aortic arch. Small left pleural effusion. Mild left basilar interstitial thickening. Minimal bilateral diffuse interstitial thickening. No pneumothorax is seen. Mild  dextrocurvature of the midthoracic spine. Cholecystectomy clips. IMPRESSION: 1. Small left pleural effusion. 2. Mild left basilar interstitial thickening which could represent minimal interstitial pulmonary edema. 3. Cardiac silhouette is again mildly enlarged. Electronically Signed   By: Yvonne Kendall M.D.   On: 10/09/2022 16:00   DG Knee Complete 4 Views Right  Result Date: 10/09/2022 CLINICAL DATA:  Right knee pain status post fall EXAM: RIGHT KNEE - COMPLETE 4+ VIEW COMPARISON:  None Available. FINDINGS: Severe medial compartment joint space narrowing. Mild-to-moderate lateral and mild medial compartment chondrocalcinosis. Minimal inferior patellar degenerative osteophytosis. No joint effusion. No acute fracture is seen. No  dislocation. IMPRESSION: 1. No acute fracture. 2. Severe medial compartment osteoarthritis. Electronically Signed   By: Yvonne Kendall M.D.   On: 10/09/2022 15:20   DG Knee Complete 4 Views Left  Result Date: 10/09/2022 CLINICAL DATA:  Left knee pain status post fall EXAM: LEFT KNEE - COMPLETE 4+ VIEW COMPARISON:  None Available. FINDINGS: Moderate medial compartment joint space narrowing with mild-to-moderate peripheral degenerative osteophytosis. Mild inferior greater than superior patellar degenerative osteophytosis. Tiny joint effusion. No acute fracture is seen. No dislocation. There are two adjacent 10 mm and 9 mm ossicles overlying the posteromedial soft tissues at the level of the proximal tibia, possibly within a Baker's cyst. Mild-to-moderate vascular calcifications. IMPRESSION: 1. No acute fracture. 2. Moderate medial compartment and mild patellofemoral compartment osteoarthritis. 3. There are two possible loose bodies within a posterior knee Baker's cyst. Electronically Signed   By: Yvonne Kendall M.D.   On: 10/09/2022 15:18   DG Elbow 2 Views Right  Result Date: 10/09/2022 CLINICAL DATA:  Status post fall. EXAM: RIGHT ELBOW - 2 VIEW COMPARISON:  None Available.  FINDINGS: There is normal positioning of the central humeral fat pad without an elbow joint effusion seen. No acute fracture line is seen in the provided frontal and lateral views. Minimal medial elbow joint space narrowing with associated peripheral trochlear and coronoid process degenerative spurring. No dislocation. IMPRESSION: 1. No acute fracture is seen. 2. Mild medial elbow osteoarthritis. Electronically Signed   By: Yvonne Kendall M.D.   On: 10/09/2022 15:16   DG Pelvis 1-2 Views  Result Date: 10/09/2022 CLINICAL DATA:  Fall. EXAM: PELVIS - 1-2 VIEW COMPARISON:  CT abdomen and pelvis 05/25/2022 FINDINGS: There is diffuse decreased bone mineralization. Mild bilateral sacroiliac subchondral sclerosis without joint space narrowing. The pubic symphysis joint space is maintained. No acute fracture is seen. No dislocation. Mild bilateral superolateral acetabular degenerative osteophytosis. Moderate stool within the rectum. IMPRESSION: 1. No acute fracture. 2. Mild bilateral hip osteoarthritis. Electronically Signed   By: Yvonne Kendall M.D.   On: 10/09/2022 15:14   CT Maxillofacial Wo Contrast  Result Date: 10/09/2022 CLINICAL DATA:  Provided history: Facial trauma, blunt. Neck trauma. Head trauma, minor. Fall. Left periorbital and facial swelling. EXAM: CT MAXILLOFACIAL WITHOUT CONTRAST CT CERVICAL SPINE WITHOUT CONTRAST TECHNIQUE: Multidetector CT imaging of the cervical spine and maxillofacial structures was performed using the standard protocol without intravenous contrast. Multiplanar CT image reconstructions of the cervical spine and maxillofacial structures were also generated. RADIATION DOSE REDUCTION: This exam was performed according to the departmental dose-optimization program which includes automated exposure control, adjustment of the mA and/or kV according to patient size and/or use of iterative reconstruction technique. COMPARISON:  Cervical spine CT 01/04/2019.  Head CT 09/13/2021.  FINDINGS: CT MAXILLOFACIAL FINDINGS Osseous: No acute maxillofacial fracture is identified. Orbits: Forehead, left facial and left periorbital soft tissue swelling/hematoma. Sinuses: 14 mm mucous retention cyst, and background mild mucosal thickening, within the right maxillary sinus. Minimal mucosal thickening versus small mucous retention cyst within the left maxillary sinus. Minimal mucosal thickening within the bilateral sphenoid sinuses. Soft tissues: Forehead, left facial and left periorbital soft tissue swelling/hematoma. CT CERVICAL SPINE FINDINGS Alignment: Nonspecific reversal of the expected cervical lordosis. Dextrocurvature of the cervical and upper thoracic spine, centered at the cervicothoracic junction. No significant spondylolisthesis. Skull base and vertebrae: The basion-dental and atlanto-dental intervals are maintained.No evidence of acute fracture to the cervical spine. Soft tissues and spinal canal: No prevertebral fluid or swelling. No visible canal hematoma. Disc levels:  Cervical spondylosis with multilevel disc space narrowing, disc bulges, posterior disc osteophyte complexes, endplate spurring, uncovertebral hypertrophy and facet arthrosis. Vertebral ankylosis at C4-C5, C5-C6 and C6-C7. No appreciable high-grade spinal canal stenosis. Multilevel bony neural foraminal narrowing. Degenerative changes are also present about the C1-C2 articulation. Upper chest: No consolidation within the imaged lung apices. No visible pneumothorax. IMPRESSION: Maxillofacial CT: 1. No acute maxillofacial fracture is identified. 2. Forehead, left facial and left periorbital soft tissue swelling/hematoma. 3. Paranasal sinus disease, as described. CT cervical spine: 1. No evidence of acute fracture to the cervical spine. 2. Nonspecific reversal of the expected cervical lordosis. 3. Dextrocurvature of the cervical and upper thoracic spine, possibly positional. 4. Cervical spondylosis and multilevel vertebral  ankylosis, as described. Electronically Signed   By: Kellie Simmering D.O.   On: 10/09/2022 14:58   CT Cervical Spine Wo Contrast  Result Date: 10/09/2022 CLINICAL DATA:  Provided history: Facial trauma, blunt. Neck trauma. Head trauma, minor. Fall. Left periorbital and facial swelling. EXAM: CT MAXILLOFACIAL WITHOUT CONTRAST CT CERVICAL SPINE WITHOUT CONTRAST TECHNIQUE: Multidetector CT imaging of the cervical spine and maxillofacial structures was performed using the standard protocol without intravenous contrast. Multiplanar CT image reconstructions of the cervical spine and maxillofacial structures were also generated. RADIATION DOSE REDUCTION: This exam was performed according to the departmental dose-optimization program which includes automated exposure control, adjustment of the mA and/or kV according to patient size and/or use of iterative reconstruction technique. COMPARISON:  Cervical spine CT 01/04/2019.  Head CT 09/13/2021. FINDINGS: CT MAXILLOFACIAL FINDINGS Osseous: No acute maxillofacial fracture is identified. Orbits: Forehead, left facial and left periorbital soft tissue swelling/hematoma. Sinuses: 14 mm mucous retention cyst, and background mild mucosal thickening, within the right maxillary sinus. Minimal mucosal thickening versus small mucous retention cyst within the left maxillary sinus. Minimal mucosal thickening within the bilateral sphenoid sinuses. Soft tissues: Forehead, left facial and left periorbital soft tissue swelling/hematoma. CT CERVICAL SPINE FINDINGS Alignment: Nonspecific reversal of the expected cervical lordosis. Dextrocurvature of the cervical and upper thoracic spine, centered at the cervicothoracic junction. No significant spondylolisthesis. Skull base and vertebrae: The basion-dental and atlanto-dental intervals are maintained.No evidence of acute fracture to the cervical spine. Soft tissues and spinal canal: No prevertebral fluid or swelling. No visible canal hematoma.  Disc levels: Cervical spondylosis with multilevel disc space narrowing, disc bulges, posterior disc osteophyte complexes, endplate spurring, uncovertebral hypertrophy and facet arthrosis. Vertebral ankylosis at C4-C5, C5-C6 and C6-C7. No appreciable high-grade spinal canal stenosis. Multilevel bony neural foraminal narrowing. Degenerative changes are also present about the C1-C2 articulation. Upper chest: No consolidation within the imaged lung apices. No visible pneumothorax. IMPRESSION: Maxillofacial CT: 1. No acute maxillofacial fracture is identified. 2. Forehead, left facial and left periorbital soft tissue swelling/hematoma. 3. Paranasal sinus disease, as described. CT cervical spine: 1. No evidence of acute fracture to the cervical spine. 2. Nonspecific reversal of the expected cervical lordosis. 3. Dextrocurvature of the cervical and upper thoracic spine, possibly positional. 4. Cervical spondylosis and multilevel vertebral ankylosis, as described. Electronically Signed   By: Kellie Simmering D.O.   On: 10/09/2022 14:58   CT Head Wo Contrast  Result Date: 10/09/2022 CLINICAL DATA:  Trauma, fall EXAM: CT HEAD WITHOUT CONTRAST TECHNIQUE: Contiguous axial images were obtained from the base of the skull through the vertex without intravenous contrast. RADIATION DOSE REDUCTION: This exam was performed according to the departmental dose-optimization program which includes automated exposure control, adjustment of the mA and/or kV according to patient size and/or use  of iterative reconstruction technique. COMPARISON:  09/13/2021 FINDINGS: Brain: No acute intracranial findings are seen. There are no signs of bleeding within the cranium. Cortical sulci are prominent. There is decreased density in periventricular white matter. Small old infarct is seen in the left occipital lobe. Dense calcifications in the falx and left frontal region appear stable. Vascular: There are scattered arterial calcifications. Skull: No  recent fracture is seen. There is subcutaneous contusion/hematoma in the frontal scalp. Sinuses/Orbits: Unremarkable. Other: None. IMPRESSION: No acute intracranial findings are seen. Atrophy. 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 No significant interval changes are noted. There is subcutaneous contusion/hematoma in the frontal scalp. No fracture is seen in calvarium. Electronically Signed   By: Elmer Picker M.D.   On: 10/09/2022 14:44        Scheduled Meds:  [START ON 10/11/2022] alfuzosin  10 mg Oral Q breakfast   heparin  5,000 Units Subcutaneous Q8H   levothyroxine  50 mcg Oral Q0600   Continuous Infusions:  cefTRIAXone (ROCEPHIN)  IV 1 g (10/10/22 2151)   lactated ringers Stopped (10/10/22 1710)   vancomycin 200 mL/hr at 10/10/22 1723     LOS: 0 days    Time spent: 39mns    JKathie Dike MD [facility]   If 7PM-7AM, please contact night-coverage [email or web address]  10/10/2022, 10:03 PM

## 2022-10-10 NOTE — Progress Notes (Signed)
Pharmacy Antibiotic Note  Chase Chase is a 88 y.o. male admitted on 10/09/2022 with UTI.  Pharmacy has been consulted for Vancomycin dosing.  Plan: Vancomycin 1000 mg IV Q 48 hrs. Goal AUC 400-550. Expected AUC: 435 SCr used: 1.27 Also, on ceftriaxone 1gm IV q24h F/U cxs and clinical progress Monitor V/S, labs and levels as indicated   Height: '5\' 7"'$  (170.2 cm) Weight: 72.7 kg (160 lb 4.8 oz) IBW/kg (Calculated) : 66.1  Temp (24hrs), Avg:98 F (36.7 C), Min:97.8 F (36.6 C), Max:98.4 F (36.9 C)  Recent Labs  Lab 10/09/22 1415 10/10/22 0403  WBC 23.1* 18.7*  CREATININE 1.61* 1.27*    Estimated Creatinine Clearance: 30.4 mL/min (A) (by C-G formula based on SCr of 1.27 mg/dL (H)).    No Known Allergies  Antimicrobials this admission: ceftriaxone 10/31 >>  vancomycin 10/31 >>   Microbiology results: 10/31  UCx: pending   Thank you for allowing pharmacy to be a part of this patient's care.  Isac Sarna, BS Pharm D, BCPS Clinical Pharmacist 10/10/2022 3:54 PM

## 2022-10-10 NOTE — Patient Instructions (Signed)

## 2022-10-10 NOTE — Progress Notes (Signed)
Pt resting in bed.  Denies pain, nausea, SHOB.  IV ABX initiated for UTI. Urine dark amber in color.  Repositioned for comfort.  Oral cares provided.

## 2022-10-10 NOTE — Progress Notes (Signed)
New order for DNR, DNR bracelet applied, patient confused and agitated at this time, pulling at IV lines and external catheter, B/L safety mitts applied, incontinence care rendered, and fall precautions in place.

## 2022-10-11 ENCOUNTER — Ambulatory Visit (HOSPITAL_COMMUNITY): Payer: Medicare Other

## 2022-10-11 DIAGNOSIS — W19XXXA Unspecified fall, initial encounter: Secondary | ICD-10-CM

## 2022-10-11 DIAGNOSIS — T796XXA Traumatic ischemia of muscle, initial encounter: Secondary | ICD-10-CM

## 2022-10-11 LAB — URINE CULTURE: Culture: <10000 — AB

## 2022-10-11 LAB — CBC
HCT: 41.9 % (ref 39.0–52.0)
Hemoglobin: 14.4 g/dL (ref 13.0–17.0)
MCH: 34.4 pg — ABNORMAL HIGH (ref 26.0–34.0)
MCHC: 34.4 g/dL (ref 30.0–36.0)
MCV: 100 fL (ref 80.0–100.0)
Platelets: 157 10*3/uL (ref 150–400)
RBC: 4.19 MIL/uL — ABNORMAL LOW (ref 4.22–5.81)
RDW: 12.3 % (ref 11.5–15.5)
WBC: 12 10*3/uL — ABNORMAL HIGH (ref 4.0–10.5)
nRBC: 0 % (ref 0.0–0.2)

## 2022-10-11 LAB — COMPREHENSIVE METABOLIC PANEL
ALT: 65 U/L — ABNORMAL HIGH (ref 0–44)
AST: 189 U/L — ABNORMAL HIGH (ref 15–41)
Albumin: 2.8 g/dL — ABNORMAL LOW (ref 3.5–5.0)
Alkaline Phosphatase: 64 U/L (ref 38–126)
Anion gap: 8 (ref 5–15)
BUN: 34 mg/dL — ABNORMAL HIGH (ref 8–23)
CO2: 26 mmol/L (ref 22–32)
Calcium: 8.8 mg/dL — ABNORMAL LOW (ref 8.9–10.3)
Chloride: 104 mmol/L (ref 98–111)
Creatinine, Ser: 1.1 mg/dL (ref 0.61–1.24)
GFR, Estimated: >60 mL/min (ref >60)
Glucose, Bld: 99 mg/dL (ref 70–99)
Potassium: 3.9 mmol/L (ref 3.5–5.1)
Sodium: 138 mmol/L (ref 135–145)
Total Bilirubin: 1 mg/dL (ref 0.3–1.2)
Total Protein: 5.6 g/dL — ABNORMAL LOW (ref 6.5–8.1)

## 2022-10-11 LAB — CK: Total CK: 3407 U/L — ABNORMAL HIGH (ref 49–397)

## 2022-10-11 NOTE — NC FL2 (Addendum)
[place] LEVEL OF CARE SCREENING TOOL     IDENTIFICATION  Patient Name: Chase Chase Birthdate: Apr 09, 1922 Sex: male Admission Date (Current Location): 10/09/2022  [facility] and [place] Number:  [employer] and Address:  [place] [address], [place]      Provider Number: [id number]  Attending Physician Name and Address:  Rodena Goldmann, DO  Relative Name and Phone Number:  Herschel Senegal (Daughter)   [phone]    Current Level of Care: Hospital Recommended Level of Care: [place] Prior Approval Number:    Date Approved/Denied:   PASRR Number:   [id number]  Discharge Plan: SNF    Current Diagnoses: Patient Active Problem List   Diagnosis Date Noted   AKI (acute kidney injury) ([place]) 10/10/2022   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   Rhabdomyolysis 10/09/2022   Prolonged QT interval 76/73/4193   Acute metabolic encephalopathy 79/01/4096   Liver enzyme elevation 10/09/2022   Acute pancreatitis 05/25/2022   Pneumonia 05/25/2022   Pacemaker 02/01/2021   CHB (complete heart block) ([place]) 09/18/2019   Neck stiffness 04/24/2013   Postural imbalance 04/24/2013   Lower urinary tract infectious disease 04/08/2013   Near syncope 04/08/2013   Dehydration 04/08/2013   History of vertigo 04/08/2013   Abnormal EKG 04/08/2013   Abnormal CXR (chest x-ray) 01/10/2012   Bronchitis with flu 12/10/2011   Glaucoma 12/10/2011    Orientation RESPIRATION BLADDER Height & Weight     Self, Situation, Place  Normal External catheter Weight: 72.7 kg Height:  '5\' 7"'$  (170.2 cm)  BEHAVIORAL SYMPTOMS/MOOD NEUROLOGICAL BOWEL NUTRITION STATUS      Continent Diet (See DC summary)  AMBULATORY STATUS COMMUNICATION OF NEEDS Skin   Extensive Assist Verbally Normal                       Personal Care Assistance Level of Assistance  Bathing, Feeding, Dressing Bathing Assistance: Maximum assistance Feeding assistance:  Limited assistance Dressing Assistance: Maximum assistance     Functional Limitations Info  Sight, Hearing, Speech Sight Info: Impaired Hearing Info: Impaired Speech Info: Adequate    SPECIAL CARE FACTORS FREQUENCY  PT (By licensed PT)     PT Frequency: 5 times a week              Contractures Contractures Info: Not present    Additional Factors Info  Code Status, Allergies Code Status Info: DNR Allergies Info: NKDA           Current Medications (10/11/2022):  This is the current hospital active medication list Current Facility-Administered Medications  Medication Dose Route Frequency Provider Last Rate Last Admin   acetaminophen (TYLENOL) tablet 650 mg  650 mg Oral Q6H PRN Emokpae, Ejiroghene E, MD       Or   acetaminophen (TYLENOL) suppository 650 mg  650 mg Rectal Q6H PRN Emokpae, Ejiroghene E, MD       alfuzosin (UROXATRAL) 24 hr tablet 10 mg  10 mg Oral Q breakfast Memon, Jolaine Artist, MD   10 mg at 10/11/22 0736   cefTRIAXone (ROCEPHIN) 1 g in sodium chloride 0.9 % 100 mL IVPB  1 g Intravenous Q24H Emokpae, Ejiroghene E, MD   Stopped at 10/10/22 2225   haloperidol lactate (HALDOL) injection 2 mg  2 mg Intramuscular Once Adefeso, Oladapo, DO       heparin injection 5,000 Units  5,000 Units Subcutaneous Q8H Emokpae, Ejiroghene E, MD   5,000 Units at 10/11/22 [phone]  lactated ringers infusion   Intravenous Continuous Kathie Dike, MD   Stopped at 10/10/22 1710   levothyroxine (SYNTHROID) tablet 50 mcg  50 mcg Oral Q0600 Emokpae, Ejiroghene E, MD   50 mcg at 10/11/22 0557   polyethylene glycol (MIRALAX / GLYCOLAX) packet 17 g  17 g Oral Daily PRN Emokpae, Ejiroghene E, MD       vancomycin (VANCOCIN) IVPB 1000 mg/200 mL premix  1,000 mg Intravenous Q48H Kathie Dike, MD   Stopped at 10/10/22 1810     Discharge Medications: Please see discharge summary for a list of discharge medications.  Relevant Imaging Results:  Relevant Lab Results:   Additional  Information SS# [id number]  Boneta Lucks, RN

## 2022-10-11 NOTE — Progress Notes (Addendum)
   Subjective:  Patient states he feels "good" (but still limited in his ability to effectively communicate). Per nursing, patient was agitated last night, pulling at IV lines and external catheter. Safety mitts were applied and incontinence care rendered. Patient was resting comfortable early this morning so safety mitts were removed.   Objective:  Vital signs in last 24 hours: Vitals:   10/10/22 0752 10/10/22 1439 10/10/22 1953 10/11/22 0414  BP: (!) 125/58 111/65 128/69 136/69  Pulse: 66 68 68 74  Resp: 20  (!) 21 16  Temp: 98 F (36.7 C) 98.4 F (36.9 C) 98.5 F (36.9 C) 98.5 F (36.9 C)  TempSrc: Oral Oral Oral Oral  SpO2: 96% 96% 98% 96%  Weight:      Height:       Weight change:   Intake/Output Summary (Last 24 hours) at 10/11/2022 1012 Last data filed at 10/11/2022 0800 Gross per 24 hour  Intake 2305.4 ml  Output 800 ml  Net 1505.4 ml   Physical Examination  General: sitting in chair, in no acute distress Head: healing abrasions with mild erythema most notably on left forehead, normocephalic Cardiovascular: regular rate and rhythm without murmurs, rubs, or gallops, no LEE Respiratory: normal respiratory effort, lungs CTAB Abdominal: normoactive bowel sounds, no tenderness to palpation Skin: warm, dry, no lesions Central Nervous System: alert and oriented x 2 Psychiatric: Normal mood and affect   Assessment/Plan:  Principal Problem:   Rhabdomyolysis Active Problems:   CHB (complete heart block) ([facility])   Pacemaker   Prolonged QT interval   Acute metabolic encephalopathy   Liver enzyme elevation   AKI (acute kidney injury) ([place])   Mediastinal mass   Rhabdomyolysis Post fall and immobilization on the floor. CK 7,823 on admission and has been improving with IV fluids (now 3,407) -Continue to follow   Acute metabolic encephalopathy Etiology is most likely multifactorial. Patient continues to have hallucinations (son-in-law states he was seeing crows/cats  this morning). Patient was dehydrated on admission and urine culture from 10/27 positive for Enterococcus (see below). Family states he gets like this every time he is hospitalized and improves on discharge. -Continue reorienting measures and antibiotics  Urinary tract infection Vitals are stable and WBC count trending down to 12.0 (18.7 yesterday). Urine culture from 10/27 positive for Enterococcus. Patient has had multiple occurrences with this in the past. CT abdomen from yesterday revealed bilateral non-obstructing renal calculi with mild hydronephrosis on the left which has been a chronic issue.  -Continue on Rocephin (Day 2 of 14) and Vancomycin (dosed by pharmacy) -Continue to monitor vitals   Acute kidney injury secondary to rhabdomyolysis Resolved   Elevated LFTs -Discussed that this is secondary to rhabdomyolysis. Slight increase today with AST (189), ALT (65) -Continue to trend  Hypoalbuminemia 2.8 today (3.6 on admission). -Dietician consult ordered -Follow up with consult and CMP  BPH -Contine uroxatral  Mobility Patient with recent fall at home. PT/OT consulted and recommend SNF. -Follow-up with TOC, patient, and family regarding options   LOS: 2 days  Stanford Breed, Medical Student 10/11/2022, 10:12 AM

## 2022-10-11 NOTE — Evaluation (Signed)
Occupational Therapy Evaluation Patient Details Name: Chase Chase MRN: [id number] DOB: 03-May-1922 Today's Date: 10/11/2022   History of Present Illness BRON SNELLINGS is a 88 y.o. male with medical history significant for Complete heart block, pacemaker status, hypothyroidism.  Patient was brought to the ED via EMS with reports of a fall and with confusion since fall.  At the time of my evaluation, patient's daughter Simona Huh is at bedside.  Patient is able to answer some questions, he is a little bit confused, daughter assists with the history.  Patient lives alone.  He was last seen at about 9:30 PM last night and he fell subsequently sometime after that in the kitchen.  Patient ambulates with a walker. He turned abruptly and ended up on the floor.  He was unable to get up and was found on the floor this morning.  He has a history of frequent falls but normally he is able to get up.  Daughter states he is a little bit confused, making some off-the-wall statements but this has improved since this morning.  He was diagnosed with a UTI 4 days ago- 10/26.  Daughter said he was weak.  Has frequent UTIs.  UA was consistent with UTI, so he was started on Macrobid.  He has had 1 dose.  Patient denies dysuria to me, unable to rule out other urinary symptoms as his speech is a bit confused. (per MD)   Clinical Impression   Pt agreeable to OT and PT co-evaluation. Pt mildly confused but able to follow commands well. Pt unsteady in standing needing mod to max assist for step pivot transfers with RW. Pt reports decrease in his ability to read over the past 10 days as well but able to read the board ~8 feet away with mild difficulty. Pt demonstrates B UE weakness with limited A/ROM seemingly due to weakness. Pt also required some assistance to get into bed and supervision to min G assist to sit up in bed. Pt is generally weak and a high fall risk at this time. Pt left in chair with call bell within reach, chair alarm  set, and family present. Pt will benefit from continued OT in the hospital and recommended venue below to increase strength, balance, and endurance for safe ADL's.         Recommendations for follow up therapy are one component of a multi-disciplinary discharge planning process, led by the attending physician.  Recommendations may be updated based on patient status, additional functional criteria and insurance authorization.   Follow Up Recommendations  Skilled nursing-short term rehab (<3 hours/day)    Assistance Recommended at Discharge Frequent or constant Supervision/Assistance  Patient can return home with the following A lot of help with walking and/or transfers;A little help with bathing/dressing/bathroom;Assistance with cooking/housework;Assist for transportation;Help with stairs or ramp for entrance;Direct supervision/assist for medications management    Functional Status Assessment  Patient has had a recent decline in their functional status and demonstrates the ability to make significant improvements in function in a reasonable and predictable amount of time.  Equipment Recommendations  None recommended by OT    Recommendations for Other Services Other (comment) (Optometrist evaluation based on pt report of decrease in vision over past 10 days.)     Precautions / Restrictions Precautions Precautions: Fall Restrictions Weight Bearing Restrictions: No      Mobility Bed Mobility Overal bed mobility: Needs Assistance Bed Mobility: Sit to Supine, Supine to Sit     Supine to sit: Min guard  Sit to supine: Min assist   General bed mobility comments: Assist to lift B LE into bed. Slow labored movement to sit up at EOB.    Transfers Overall transfer level: Needs assistance Equipment used: Rolling walker (2 wheels) Transfers: Sit to/from Stand, Bed to chair/wheelchair/BSC Sit to Stand: Mod assist     Step pivot transfers: Mod assist, Max assist     General transfer  comment: Posterior lean when standing. Mod A to boost out of chair but mod to max to pivot to chair, bed, or BSC with RW.      Balance Overall balance assessment: Needs assistance Sitting-balance support: Feet supported, Bilateral upper extremity supported Sitting balance-Leahy Scale: Fair Sitting balance - Comments: seated EOB   Standing balance support: Bilateral upper extremity supported, During functional activity, Reliant on assistive device for balance Standing balance-Leahy Scale: Poor Standing balance comment: using RW                           ADL either performed or assessed with clinical judgement   ADL Overall ADL's : Needs assistance/impaired     Grooming: Sitting;Minimal assistance   Upper Body Bathing: Minimal assistance;Sitting   Lower Body Bathing: Sitting/lateral leans;Moderate assistance   Upper Body Dressing : Minimal assistance;Sitting   Lower Body Dressing: Minimal assistance;Sitting/lateral leans Lower Body Dressing Details (indicate cue type and reason): Extended time and labored movement doffing and donning socks. Assist needed to don R sock seated in recliner. Toilet Transfer: Moderate assistance;Maximal assistance;Rolling walker (2 wheels);Stand-pivot Armed forces technical officer Details (indicate cue type and reason): Chair to [facility] and back. Toileting- Clothing Manipulation and Hygiene: Maximal assistance;Sit to/from stand Toileting - Clothing Manipulation Details (indicate cue type and reason): Pt able to stand with assist from PT while this therapist completed peri-care for pt.             Vision Baseline Vision/History: 1 Wears glasses Ability to See in Adequate Light: 1 Impaired Patient Visual Report: Other (comment) (Pt reports that it has gotton harder for him to read in the past 10 days.) Vision Assessment?:  (Pt able to read the board with mild difficulty. 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.)                 Pertinent Vitals/Pain Pain Assessment Pain Assessment: Faces Faces Pain Scale: No hurt     Hand Dominance Right   Extremity/Trunk Assessment Upper Extremity Assessment Upper Extremity Assessment: Generalized weakness (3-/5 bilateral shoulder flexion MMT.)   Lower Extremity Assessment Lower Extremity Assessment: Defer to PT evaluation   Cervical / Trunk Assessment Cervical / Trunk Assessment: Kyphotic   Communication Communication Communication: No difficulties   Cognition Arousal/Alertness: Awake/alert Behavior During Therapy: WFL for tasks assessed/performed Overall Cognitive Status: Impaired/Different from baseline                                 General Comments: Pt seems slightly confused thinking this therapist was a different person. Able to follow 1 or two step commands well.                      Home Living Family/patient expects to be discharged to:: Private residence Living Arrangements: Alone Available Help at Discharge: Family;Available PRN/intermittently Type of Home: House Home Access: Stairs to enter [employer] of Steps: 2 Entrance Stairs-Rails: None Home Layout: One  level     Bathroom Shower/Tub: Occupational psychologist: Handicapped height Bathroom Accessibility: Yes   Home Equipment: Conservation officer, nature (2 wheels);Rollator (4 wheels);Wheelchair - manual   Additional Comments: Daughter reported no change in home living situation since pt's last hospital admission.      Prior Functioning/Environment Prior Level of Function : Needs assist       Physical Assist : Mobility (physical) Mobility (physical): Transfers;Gait   Mobility Comments: household ambulation with RW, rides golf cart if he wants to go through yard ADLs Comments: Pt dressess himself but struggles through it per daughters report; aid 2x/week for bathing.        OT Problem List: Decreased strength;Decreased range of motion;Decreased  activity tolerance;Impaired balance (sitting and/or standing);Impaired vision/perception;Decreased cognition      OT Treatment/Interventions: Self-care/ADL training;Therapeutic exercise;Therapeutic activities;Patient/family education;Balance training;Visual/perceptual remediation/compensation    OT Goals(Current goals can be found in the care plan section) Acute Rehab OT Goals Patient Stated Goal: open to rehab to get stronger OT Goal Formulation: With patient/family Time For Goal Achievement: 10/25/22 Potential to Achieve Goals: Fair  OT Frequency: Min 2X/week    Co-evaluation PT/OT/SLP Co-Evaluation/Treatment: Yes Reason for Co-Treatment: To address functional/ADL transfers   OT goals addressed during session: ADL's and self-care                       End of Session Equipment Utilized During Treatment: Rolling walker (2 wheels);Gait belt  Activity Tolerance: Patient tolerated treatment well Patient left: in chair;with call bell/phone within reach;with chair alarm set;with family/visitor present  OT Visit Diagnosis: Unsteadiness on feet (R26.81);Other abnormalities of gait and mobility (R26.89);Repeated falls (R29.6);History of falling (Z91.81);Muscle weakness (generalized) (M62.81);Low vision, both eyes (H54.2)                Time: 5859-2924 OT Time Calculation (min): 23 min Charges:  OT General Charges $OT Visit: 1 Visit OT Evaluation $OT Eval Low Complexity: 1 Low  Lurae Hornbrook OT, MOT  Larey Seat 10/11/2022, 9:43 AM

## 2022-10-11 NOTE — Plan of Care (Signed)
  Problem: Acute Rehab PT Goals(only PT should resolve) Goal: Pt Will Go Supine/Side To Sit Outcome: Progressing Flowsheets (Taken 10/11/2022 1028) Pt will go Supine/Side to Sit: with min guard assist Goal: Patient Will Transfer Sit To/From Stand Outcome: Progressing Flowsheets (Taken 10/11/2022 1028) Patient will transfer sit to/from stand: with minimal assist Goal: Pt Will Transfer Bed To Chair/Chair To Bed Outcome: Progressing Flowsheets (Taken 10/11/2022 1028) Pt will Transfer Bed to Chair/Chair to Bed: with min assist Goal: Pt Will Ambulate Outcome: Progressing Flowsheets (Taken 10/11/2022 1028) Pt will Ambulate:  25 feet  with minimal assist  with moderate assist  with rolling walker   10:28 AM, 10/11/22 Lonell Grandchild, MPT Physical Therapist with [facility] [phone] office [phone] mobile phone

## 2022-10-11 NOTE — TOC Progression Note (Signed)
Transition of Care [facility]) - Progression Note    Patient Details  Name: Chase Chase MRN: [id number] Date of Birth: Apr 04, 1922  [facility] CM/SW Contact  Boneta Lucks, RN Phone Number: 10/11/2022, 11:10 AM  Clinical Narrative:   PT is recommending SNF. Daughter at the bedside.  They are agreeable to SNF and want [facility]. TOC will send out for a bed offer. DC planning 1-2 days.    Expected Discharge Plan: [place] Barriers to Discharge: Continued Medical Work up  Expected Discharge Plan and Services Expected Discharge Plan: [place]       Readmission Risk Interventions    10/11/2022   11:09 AM  Readmission Risk Prevention Plan  Post Dischage Appt Not Complete  Medication Screening Complete  Transportation Screening Complete

## 2022-10-11 NOTE — Evaluation (Signed)
Physical Therapy Evaluation Patient Details Name: Chase Chase MRN: [id number] DOB: 1922-08-08 Today's Date: 10/11/2022  History of Present Illness  Chase Chase is a 88 y.o. male with medical history significant for Complete heart block, pacemaker status, hypothyroidism.  Patient was brought to the ED via EMS with reports of a fall and with confusion since fall.  At the time of my evaluation, patient's daughter Simona Huh is at bedside.  Patient is able to answer some questions, he is a little bit confused, daughter assists with the history.  Patient lives alone.  He was last seen at about 9:30 PM last night and he fell subsequently sometime after that in the kitchen.  Patient ambulates with a walker. He turned abruptly and ended up on the floor.  He was unable to get up and was found on the floor this morning.  He has a history of frequent falls but normally he is able to get up.  Daughter states he is a little bit confused, making some off-the-wall statements but this has improved since this morning.  He was diagnosed with a UTI 4 days ago- 10/26.  Daughter said he was weak.  Has frequent UTIs.  UA was consistent with UTI, so he was started on Macrobid.  He has had 1 dose.  Patient denies dysuria to me, unable to rule out other urinary symptoms as his speech is a bit confused.   Clinical Impression  Patient demonstrates slow labored movement for sitting up at bedside, very unsteady on feet, at high risk for falls and limited to a few side steps at bedside before having to sit due generalized weakness and poor standing balance. Patient tolerated sitting up in chair after therapy with his daughter present in room.  Patient will benefit from continued skilled physical therapy in hospital and recommended venue below to increase strength, balance, endurance for safe ADLs and gait.        Recommendations for follow up therapy are one component of a multi-disciplinary discharge planning process, led by the  attending physician.  Recommendations may be updated based on patient status, additional functional criteria and insurance authorization.  Follow Up Recommendations Skilled nursing-short term rehab (<3 hours/day) Can patient physically be transported by private vehicle: No    Assistance Recommended at Discharge Frequent or constant Supervision/Assistance  Patient can return home with the following  A lot of help with bathing/dressing/bathroom;A lot of help with walking and/or transfers;Help with stairs or ramp for entrance;Assistance with cooking/housework    Equipment Recommendations None recommended by PT  Recommendations for Other Services       Functional Status Assessment Patient has had a recent decline in their functional status and demonstrates the ability to make significant improvements in function in a reasonable and predictable amount of time.     Precautions / Restrictions Precautions Precautions: Fall Restrictions Weight Bearing Restrictions: No      Mobility  Bed Mobility Overal bed mobility: Needs Assistance Bed Mobility: Sit to Supine, Supine to Sit     Supine to sit: Min guard Sit to supine: Min assist   General bed mobility comments: increased time with labored movement    Transfers Overall transfer level: Needs assistance Equipment used: Rolling walker (2 wheels) Transfers: Sit to/from Stand, Bed to chair/wheelchair/BSC Sit to Stand: Mod assist   Step pivot transfers: Mod assist, Max assist       General transfer comment: unsteady labored movement with buckling of knees    Ambulation/Gait Ambulation/Gait assistance: Mod assist,  Max assist Gait Distance (Feet): 4 Feet Assistive device: Rolling walker (2 wheels) Gait Pattern/deviations: Decreased step length - right, Decreased step length - left, Decreased stride length, Trunk flexed, Knees buckling Gait velocity: decreased     General Gait Details: limited to a few side steps at bedside  with slow labored movement, buckling of knees and limited mostly due to fatigue and mild confusion  Stairs            Wheelchair Mobility    Modified Rankin (Stroke Patients Only)       Balance Overall balance assessment: Needs assistance Sitting-balance support: Feet supported, No upper extremity supported Sitting balance-Leahy Scale: Fair Sitting balance - Comments: seated EOB   Standing balance support: Bilateral upper extremity supported, During functional activity, Reliant on assistive device for balance Standing balance-Leahy Scale: Poor Standing balance comment: using RW                             Pertinent Vitals/Pain Pain Assessment Pain Assessment: No/denies pain    Home Living Family/patient expects to be discharged to:: Private residence Living Arrangements: Alone Available Help at Discharge: Family;Available PRN/intermittently Type of Home: House Home Access: Stairs to enter Entrance Stairs-Rails: None Entrance Stairs-Number of Steps: 2   Home Layout: One level Home Equipment: Conservation officer, nature (2 wheels);Rollator (4 wheels);Wheelchair - manual Additional Comments: Daughter reported no change in home living situation since pt's last hospital admission.    Prior Function Prior Level of Function : Needs assist       Physical Assist : Mobility (physical) Mobility (physical): Transfers;Gait   Mobility Comments: household ambulation with RW, rides golf cart if he wants to go through yard ADLs Comments: Pt dressess himself but struggles through it per daughters report; aid 2x/week for bathing.     Hand Dominance   Dominant Hand: Right    Extremity/Trunk Assessment   Upper Extremity Assessment Upper Extremity Assessment: Defer to OT evaluation    Lower Extremity Assessment Lower Extremity Assessment: Generalized weakness    Cervical / Trunk Assessment Cervical / Trunk Assessment: Kyphotic  Communication   Communication: No  difficulties  Cognition Arousal/Alertness: Awake/alert Behavior During Therapy: WFL for tasks assessed/performed Overall Cognitive Status: Impaired/Different from baseline                                 General Comments: required frequent repeated verbal/tactile cueing to complete functional tasks        General Comments      Exercises     Assessment/Plan    PT Assessment Patient needs continued PT services  PT Problem List Decreased strength;Decreased activity tolerance;Decreased balance;Decreased mobility       PT Treatment Interventions DME instruction;Gait training;Stair training;Functional mobility training;Therapeutic activities;Therapeutic exercise;Patient/family education;Balance training    PT Goals (Current goals can be found in the Care Plan section)  Acute Rehab PT Goals Patient Stated Goal: return home with family to assist PT Goal Formulation: With patient/family Time For Goal Achievement: 10/25/22 Potential to Achieve Goals: Good    Frequency Min 3X/week     Co-evaluation PT/OT/SLP Co-Evaluation/Treatment: Yes Reason for Co-Treatment: To address functional/ADL transfers PT goals addressed during session: Mobility/safety with mobility;Balance;Proper use of DME OT goals addressed during session: ADL's and self-care       AM-PAC PT "6 Clicks" Mobility  Outcome Measure Help needed turning from your back to your side while in  a flat bed without using bedrails?: A Little Help needed moving from lying on your back to sitting on the side of a flat bed without using bedrails?: A Little Help needed moving to and from a bed to a chair (including a wheelchair)?: A Lot Help needed standing up from a chair using your arms (e.g., wheelchair or bedside chair)?: A Lot Help needed to walk in hospital room?: A Lot Help needed climbing 3-5 steps with a railing? : A Lot 6 Click Score: 14    End of Session Equipment Utilized During Treatment: Gait  belt Activity Tolerance: Patient tolerated treatment well;Patient limited by fatigue Patient left: in chair;with call bell/phone within reach;with chair alarm set;with family/visitor present Nurse Communication: Mobility status PT Visit Diagnosis: Unsteadiness on feet (R26.81);Other abnormalities of gait and mobility (R26.89);Muscle weakness (generalized) (M62.81)    Time: 3291-9166 PT Time Calculation (min) (ACUTE ONLY): 20 min   Charges:   PT Evaluation $PT Eval Moderate Complexity: 1 Mod PT Treatments $Therapeutic Activity: 8-22 mins        10:26 AM, 10/11/22 Lonell Grandchild, MPT Physical Therapist with [facility] 336 [phone] office [phone] mobile phone

## 2022-10-11 NOTE — Plan of Care (Signed)
  Problem: Acute Rehab OT Goals (only OT should resolve) Goal: Pt. Will Perform Grooming Flowsheets (Taken 10/11/2022 0948) Pt Will Perform Grooming:  with modified independence  sitting Goal: Pt. Will Perform Upper Body Bathing Flowsheets (Taken 10/11/2022 0948) Pt Will Perform Upper Body Bathing:  with modified independence  sitting Goal: Pt. Will Perform Lower Body Bathing Flowsheets (Taken 10/11/2022 0948) Pt Will Perform Lower Body Bathing:  with modified independence  sitting/lateral leans Goal: Pt. Will Perform Upper Body Dressing Flowsheets (Taken 10/11/2022 0948) Pt Will Perform Upper Body Dressing:  with modified independence  sitting Goal: Pt. Will Perform Lower Body Dressing Flowsheets (Taken 10/11/2022 0948) Pt Will Perform Lower Body Dressing:  with modified independence  sitting/lateral leans Goal: Pt. Will Transfer To Toilet Flowsheets (Taken 10/11/2022 [phone]) Pt Will Transfer to Toilet:  with min guard assist  stand pivot transfer Goal: Pt. Will Perform Toileting-Clothing Manipulation Flowsheets (Taken 10/11/2022 0948) Pt Will Perform Toileting - Clothing Manipulation and hygiene:  with modified independence  sitting/lateral leans Goal: Pt/Caregiver Will Perform Home Exercise Program Flowsheets (Taken 10/11/2022 [phone]) Pt/caregiver will Perform Home Exercise Program:  Increased strength  Increased ROM  Both right and left upper extremity  Independently  Nell Schrack OT, MOT

## 2022-10-11 NOTE — Progress Notes (Signed)
Patient is currently resting quietly with family at bedside, B/L mittens removed.

## 2022-10-12 DIAGNOSIS — N401 Enlarged prostate with lower urinary tract symptoms: Secondary | ICD-10-CM | POA: Diagnosis not present

## 2022-10-12 DIAGNOSIS — Z950 Presence of cardiac pacemaker: Secondary | ICD-10-CM | POA: Diagnosis not present

## 2022-10-12 DIAGNOSIS — G9341 Metabolic encephalopathy: Secondary | ICD-10-CM | POA: Diagnosis not present

## 2022-10-12 DIAGNOSIS — R262 Difficulty in walking, not elsewhere classified: Secondary | ICD-10-CM | POA: Diagnosis not present

## 2022-10-12 DIAGNOSIS — T796XXD Traumatic ischemia of muscle, subsequent encounter: Secondary | ICD-10-CM | POA: Diagnosis not present

## 2022-10-12 DIAGNOSIS — H9193 Unspecified hearing loss, bilateral: Secondary | ICD-10-CM | POA: Diagnosis not present

## 2022-10-12 DIAGNOSIS — R278 Other lack of coordination: Secondary | ICD-10-CM | POA: Diagnosis not present

## 2022-10-12 DIAGNOSIS — N179 Acute kidney failure, unspecified: Secondary | ICD-10-CM | POA: Diagnosis not present

## 2022-10-12 DIAGNOSIS — M6282 Rhabdomyolysis: Secondary | ICD-10-CM | POA: Diagnosis not present

## 2022-10-12 DIAGNOSIS — I700 Atherosclerosis of aorta: Secondary | ICD-10-CM | POA: Diagnosis not present

## 2022-10-12 DIAGNOSIS — R351 Nocturia: Secondary | ICD-10-CM | POA: Diagnosis not present

## 2022-10-12 DIAGNOSIS — J9859 Other diseases of mediastinum, not elsewhere classified: Secondary | ICD-10-CM | POA: Diagnosis not present

## 2022-10-12 DIAGNOSIS — E039 Hypothyroidism, unspecified: Secondary | ICD-10-CM | POA: Diagnosis not present

## 2022-10-12 DIAGNOSIS — I442 Atrioventricular block, complete: Secondary | ICD-10-CM | POA: Diagnosis not present

## 2022-10-12 DIAGNOSIS — R945 Abnormal results of liver function studies: Secondary | ICD-10-CM | POA: Diagnosis not present

## 2022-10-12 DIAGNOSIS — Z23 Encounter for immunization: Secondary | ICD-10-CM | POA: Diagnosis not present

## 2022-10-12 DIAGNOSIS — Z87440 Personal history of urinary (tract) infections: Secondary | ICD-10-CM | POA: Diagnosis not present

## 2022-10-12 DIAGNOSIS — Y92009 Unspecified place in unspecified non-institutional (private) residence as the place of occurrence of the external cause: Secondary | ICD-10-CM | POA: Diagnosis not present

## 2022-10-12 DIAGNOSIS — I959 Hypotension, unspecified: Secondary | ICD-10-CM | POA: Diagnosis not present

## 2022-10-12 DIAGNOSIS — Y92000 Kitchen of unspecified non-institutional (private) residence as  the place of occurrence of the external cause: Secondary | ICD-10-CM | POA: Diagnosis not present

## 2022-10-12 DIAGNOSIS — W19XXXA Unspecified fall, initial encounter: Secondary | ICD-10-CM

## 2022-10-12 DIAGNOSIS — Z741 Need for assistance with personal care: Secondary | ICD-10-CM | POA: Diagnosis not present

## 2022-10-12 DIAGNOSIS — M6281 Muscle weakness (generalized): Secondary | ICD-10-CM | POA: Diagnosis not present

## 2022-10-12 DIAGNOSIS — W1830XA Fall on same level, unspecified, initial encounter: Secondary | ICD-10-CM | POA: Diagnosis not present

## 2022-10-12 DIAGNOSIS — N400 Enlarged prostate without lower urinary tract symptoms: Secondary | ICD-10-CM | POA: Diagnosis not present

## 2022-10-12 DIAGNOSIS — H409 Unspecified glaucoma: Secondary | ICD-10-CM | POA: Diagnosis not present

## 2022-10-12 DIAGNOSIS — Z9181 History of falling: Secondary | ICD-10-CM | POA: Diagnosis not present

## 2022-10-12 DIAGNOSIS — R350 Frequency of micturition: Secondary | ICD-10-CM | POA: Diagnosis not present

## 2022-10-12 DIAGNOSIS — R488 Other symbolic dysfunctions: Secondary | ICD-10-CM | POA: Diagnosis not present

## 2022-10-12 DIAGNOSIS — T796XXA Traumatic ischemia of muscle, initial encounter: Secondary | ICD-10-CM | POA: Diagnosis not present

## 2022-10-12 DIAGNOSIS — N200 Calculus of kidney: Secondary | ICD-10-CM | POA: Diagnosis not present

## 2022-10-12 DIAGNOSIS — N390 Urinary tract infection, site not specified: Secondary | ICD-10-CM | POA: Diagnosis not present

## 2022-10-12 LAB — COMPREHENSIVE METABOLIC PANEL
ALT: 57 U/L — ABNORMAL HIGH (ref 0–44)
AST: 114 U/L — ABNORMAL HIGH (ref 15–41)
Albumin: 2.5 g/dL — ABNORMAL LOW (ref 3.5–5.0)
Alkaline Phosphatase: 58 U/L (ref 38–126)
Anion gap: 8 (ref 5–15)
BUN: 31 mg/dL — ABNORMAL HIGH (ref 8–23)
CO2: 27 mmol/L (ref 22–32)
Calcium: 9 mg/dL (ref 8.9–10.3)
Chloride: 104 mmol/L (ref 98–111)
Creatinine, Ser: 1.08 mg/dL (ref 0.61–1.24)
GFR, Estimated: >60 mL/min (ref >60)
Glucose, Bld: 99 mg/dL (ref 70–99)
Potassium: 4 mmol/L (ref 3.5–5.1)
Sodium: 139 mmol/L (ref 135–145)
Total Bilirubin: 0.8 mg/dL (ref 0.3–1.2)
Total Protein: 5 g/dL — ABNORMAL LOW (ref 6.5–8.1)

## 2022-10-12 LAB — CK: Total CK: 980 U/L — ABNORMAL HIGH (ref 49–397)

## 2022-10-12 LAB — CBC
HCT: 38.4 % — ABNORMAL LOW (ref 39.0–52.0)
Hemoglobin: 13.1 g/dL (ref 13.0–17.0)
MCH: 34.6 pg — ABNORMAL HIGH (ref 26.0–34.0)
MCHC: 34.1 g/dL (ref 30.0–36.0)
MCV: 101.3 fL — ABNORMAL HIGH (ref 80.0–100.0)
Platelets: 148 10*3/uL — ABNORMAL LOW (ref 150–400)
RBC: 3.79 MIL/uL — ABNORMAL LOW (ref 4.22–5.81)
RDW: 12.1 % (ref 11.5–15.5)
WBC: 7.8 10*3/uL (ref 4.0–10.5)
nRBC: 0 % (ref 0.0–0.2)

## 2022-10-12 LAB — SARS CORONAVIRUS 2 BY RT PCR: SARS Coronavirus 2 by RT PCR: NEGATIVE

## 2022-10-12 LAB — MAGNESIUM: Magnesium: 1.9 mg/dL (ref 1.7–2.4)

## 2022-10-12 MED ORDER — CEFDINIR 300 MG PO CAPS: 300.0000 mg | ORAL_CAPSULE | Freq: Two times a day (BID) | ORAL | 0 refills | Status: DC

## 2022-10-12 NOTE — Progress Notes (Signed)
Occupational Therapy Treatment Patient Details Name: Chase Chase MRN: [id number] DOB: [birth year] Today's Date: 10/12/2022   History of present illness Chase Chase is a 88 y.o. male with medical history significant for Complete heart block, pacemaker status, hypothyroidism.  Patient was brought to the ED via EMS with reports of a fall and with confusion since fall.  At the time of my evaluation, patient's daughter Chase Chase is at bedside.  Patient is able to answer some questions, he is a little bit confused, daughter assists with the history.  Patient lives alone.  He was last seen at about 9:30 PM last night and he fell subsequently sometime after that in the kitchen.  Patient ambulates with a walker. He turned abruptly and ended up on the floor.  He was unable to get up and was found on the floor this morning.  He has a history of frequent falls but normally he is able to get up.  Daughter states he is a little bit confused, making some off-the-wall statements but this has improved since this morning.  He was diagnosed with a UTI 4 days ago- 10/26.  Daughter said he was weak.  Has frequent UTIs.  UA was consistent with UTI, so he was started on Macrobid.  He has had 1 dose.  Patient denies dysuria to me, unable to rule out other urinary symptoms as his speech is a bit confused.   OT comments  Pt agreeable to OT treatment with family and nursing in the room. Pt assisted to chair with min A for bed mobility and min to mod A for step pivot transfer. Pt demonstrates improved strength to boost from EOB which was slightly raised. Pt also completed x3 reps of sit to stand from chair with min A and extended time. Pt tolerated  B UE strengthening needing grading of reps and tactile cuing/assist for abduction and horizontal abduction of shoulder. Pt left in chair with chair alarm set and call bell within reach.    Recommendations for follow up therapy are one component of a multi-disciplinary discharge planning  process, led by the attending physician.  Recommendations may be updated based on patient status, additional functional criteria and insurance authorization.    Follow Up Recommendations  Skilled nursing-short term rehab (<3 hours/day)    Assistance Recommended at Discharge Frequent or constant Supervision/Assistance  Patient can return home with the following  A lot of help with walking and/or transfers;A little help with bathing/dressing/bathroom;Assistance with cooking/housework;Assist for transportation;Help with stairs or ramp for entrance;Direct supervision/assist for medications management   Equipment Recommendations  None recommended by OT    Recommendations for Other Services Other (comment) (optometrist evaluation)    Precautions / Restrictions Precautions Precautions: Fall Restrictions Weight Bearing Restrictions: No       Mobility Bed Mobility Overal bed mobility: Needs Assistance Bed Mobility: Supine to Sit     Supine to sit: Min assist     General bed mobility comments: Labored movement with assist to pull to sit.    Transfers Overall transfer level: Needs assistance Equipment used: Rolling walker (2 wheels) Transfers: Sit to/from Stand, Bed to chair/wheelchair/BSC Sit to Stand: Min assist     Step pivot transfers: Min assist, Mod assist     General transfer comment: Extended time and labored movement with RW.     Balance Overall balance assessment: Needs assistance Sitting-balance support: Feet supported, Bilateral upper extremity supported Sitting balance-Leahy Scale: Fair Sitting balance - Comments: seated EOB   Standing balance support:  Bilateral upper extremity supported, During functional activity, Reliant on assistive device for balance Standing balance-Leahy Scale: Fair Standing balance comment: poor to fair using RW                           ADL either performed or assessed with clinical judgement     Cognition  Arousal/Alertness: Awake/alert Behavior During Therapy: WFL for tasks assessed/performed Overall Cognitive Status: Within Functional Limits for tasks assessed                                          Exercises Exercises: General Upper Extremity, Other exercises General Exercises - Upper Extremity Shoulder Flexion: AROM, 10 reps, Both, Seated (x10 protraction as well) Shoulder ABduction: AROM, Both, 10 reps, Seated (2 sets of 5 reps due to fatigue) Shoulder Horizontal ABduction: AROM, 10 reps, Both, Seated (2 sets of 5 reps due to fatigue) Other Exercises Other Exercises: x3 consecutive reps of sit to stand with min A and extended time.                 Pertinent Vitals/ Pain       Pain Assessment Pain Assessment: No/denies pain                                                          Frequency  Min 2X/week        Progress Toward Goals  OT Goals(current goals can now be found in the care plan section)  Progress towards OT goals: Progressing toward goals  Acute Rehab OT Goals Patient Stated Goal: open to rehab to get stronger OT Goal Formulation: With patient/family Time For Goal Achievement: 10/25/22 Potential to Achieve Goals: Fair ADL Goals Pt Will Perform Grooming: with modified independence;sitting Pt Will Perform Upper Body Bathing: with modified independence;sitting Pt Will Perform Lower Body Bathing: with modified independence;sitting/lateral leans Pt Will Perform Upper Body Dressing: with modified independence;sitting Pt Will Perform Lower Body Dressing: with modified independence;sitting/lateral leans Pt Will Transfer to Toilet: with min guard assist;stand pivot transfer Pt Will Perform Toileting - Clothing Manipulation and hygiene: with modified independence;sitting/lateral leans Pt/caregiver will Perform Home Exercise Program: Increased strength;Increased ROM;Both right and left upper extremity;Independently  Plan  Discharge plan remains appropriate                                    End of Session Equipment Utilized During Treatment: Rolling walker (2 wheels);Gait belt  OT Visit Diagnosis: Unsteadiness on feet (R26.81);Other abnormalities of gait and mobility (R26.89);Repeated falls (R29.6);History of falling (Z91.81);Muscle weakness (generalized) (M62.81);Low vision, both eyes (H54.2)   Activity Tolerance Patient tolerated treatment well   Patient Left in chair;with call bell/phone within reach;with chair alarm set;with family/visitor present   Nurse Communication Mobility status        Time: 3300-7622 OT Time Calculation (min): 21 min  Charges: OT General Charges $OT Visit: 1 Visit OT Treatments $Therapeutic Exercise: 8-22 mins  Chase Chase OT, MOT  Chase Chase 10/12/2022, 9:56 AM

## 2022-10-12 NOTE — Patient Instructions (Signed)

## 2022-10-12 NOTE — Discharge Summary (Signed)
Physician Discharge Summary  Chase Chase VOJ:[id number] DOB: [birth year] DOA: 10/09/2022  PCP: Sharilyn Sites, MD  Admit date: 10/09/2022  Discharge date: 10/12/2022  Admitted From:Home  Disposition:  SNF  Recommendations for Outpatient Follow-up:  Follow up with PCP in 1-2 weeks Continue cefdinir as prescribed for 7 more days to complete total 10-day course of treatment for enterococcal UTI which is otherwise noted to be pansensitive.  May follow-up with urology and then resume chronic Macrobid for UTI prophylaxis thereafter. Recommend rechecking BMP as well as CK levels in the next 1 week to ensure resolution of rhabdomyolysis and stable kidney function Continue other home medications as prior  Home Health: None  Equipment/Devices: None  Discharge Condition:Stable  CODE STATUS: DNR  Diet recommendation: Heart Healthy  Brief/Interim Summary: 88 year old male who lives at home independently, family checks on him regularly and provides his meals, had a fall prior to admission where his family found him laying on the floor.  He was brought to the hospital found to have acute kidney injury with rhabdomyolysis.  There was also concern for underlying UTI.  Recent urine culture from a few days ago was positive for Enterococcus.  He has been started on antibiotics, IV fluids.  He was being treated empirically with Rocephin and vancomycin based on the culture positivity for Enterococcus.  Sensitivities did return and he is essentially pansensitive to multiple antibiotics and may be discharged at this point on cefdinir.  He was initially confused and hallucinating given metabolic encephalopathy related to his UTI, but now he appears to be at or near his baseline level of mentation and appears to be stable for discharge to SNF.  His rhabdomyolysis has also cleared with aggressive IV fluid and his renal function has improved as well.  Recommend rechecking labs in the next 1 week.  Discharge  Diagnoses:  Principal Problem:   Rhabdomyolysis Active Problems:   Acute metabolic encephalopathy   Liver enzyme elevation   CHB (complete heart block) (HCC)   Pacemaker   Prolonged QT interval   AKI (acute kidney injury) ([place])   Mediastinal mass   Fall  Principal discharge diagnosis: Acute metabolic encephalopathy in the setting of enterococcal UTI as well as rhabdomyolysis in the setting of mechanical fall with associated mild AKI.  Discharge Instructions  Discharge Instructions     Diet - low sodium heart healthy   Complete by: As directed    Increase activity slowly   Complete by: As directed       Allergies as of 10/12/2022   No Known Allergies      Medication List     STOP taking these medications    ibuprofen 400 MG tablet Commonly known as: ADVIL   naproxen sodium 220 MG tablet Commonly known as: ALEVE   nitrofurantoin (macrocrystal-monohydrate) 100 MG capsule Commonly known as: MACROBID   ondansetron 4 MG tablet Commonly known as: Zofran       TAKE these medications    alfuzosin 10 MG 24 hr tablet Commonly known as: UROXATRAL Take 1 tablet (10 mg total) by mouth at bedtime.   cefdinir 300 MG capsule Commonly known as: OMNICEF Take 1 capsule (300 mg total) by mouth 2 (two) times daily for 7 days.   FIRST-DUKES MOUTHWASH MT Use as directed in the mouth or throat.   Fish Oil 645 MG Caps Take 1,290 mg by mouth daily. Take 2 capsules daily for cholesterol.   latanoprost 0.005 % ophthalmic solution Commonly known as: XALATAN Place 1 drop  into both eyes at bedtime as needed.   levothyroxine 50 MCG tablet Commonly known as: SYNTHROID Take 50 mcg by mouth daily.   lidocaine 2 % solution Commonly known as: XYLOCAINE SMARTSIG:By Mouth   meclizine 25 MG tablet Commonly known as: ANTIVERT Take 25 mg by mouth as needed for dizziness (rarely needs).   PROSTATE HEALTH PO Take 2 tablets by mouth daily.   Vitamin D3 125 MCG (5000 UT)  Caps Take 5,000 Units by mouth See admin instructions. Take 1 capsule by mouth for 10 days every month   zinc gluconate 50 MG tablet Take 50 mg by mouth daily.        Contact information for follow-up providers     Sharilyn Sites, MD. Schedule an appointment as soon as possible for a visit in 1 week(s).   Specialty: Family Medicine Contact information: [address] [place] [address] [phone]              Contact information for after-discharge care     [facility] .   Service: Skilled Nursing Contact information: [address] [place] [phone]                    No Known Allergies  Consultations: None   Procedures/Studies: CT Angio Chest/Abd/Pel for Dissection W and/or W/WO  Result Date: 10/10/2022 CLINICAL DATA:  Aortic aneurysm. EXAM: CT ANGIOGRAPHY CHEST, ABDOMEN AND PELVIS TECHNIQUE: Non-contrast CT of the chest was initially obtained. Multidetector CT imaging through the chest, abdomen and pelvis was performed using the standard protocol during bolus administration of intravenous contrast. Multiplanar reconstructed images and MIPs were obtained and reviewed to evaluate the vascular anatomy. RADIATION DOSE REDUCTION: This exam was performed according to the departmental dose-optimization program which includes automated exposure control, adjustment of the mA and/or kV according to patient size and/or use of iterative reconstruction technique. CONTRAST:  31m OMNIPAQUE IOHEXOL 350 MG/ML SOLN COMPARISON:  CT abdomen pelvis dated 10/10/2022. FINDINGS: CTA CHEST FINDINGS Cardiovascular: There is no cardiomegaly or pericardial effusion. Three-vessel coronary vascular calcification. There is mild atherosclerotic calcification of the aorta. No aneurysmal dilatation or dissection. The origins of the great vessels of the aortic arch and the central pulmonary arteries appear  patent. Mediastinum/Nodes: No hilar adenopathy. The esophagus is grossly unremarkable. No mediastinal fluid collection. There is a soft tissue mass in the posterior mediastinum abutting the anterior spine and encasing the descending thoracic aorta. This mass has increased in size since the prior CT of 04/03/2022 and measures approximately 9.3 x 7.2 cm in greatest axial dimension and 15 cm in craniocaudal length. This mass terminates at the level of the diaphragm. There is displacement of the aorta anteriorly. This is most consistent with a posterior mediastinal mass and may represent lymphoma or a neurogenic tumor. Further evaluation with MRI without and with contrast is recommended. Lungs/Pleura: Small left and trace right pleural effusions. There are bibasilar compressive atelectasis. Pneumonia is not excluded. There is no pneumothorax. The central airways are patent. Musculoskeletal: Osteopenia with degenerative changes of the spine. Left pectoral pacemaker device. No acute osseous pathology. Review of the MIP images confirms the above findings. CTA ABDOMEN AND PELVIS FINDINGS VASCULAR Aorta: Moderate atherosclerotic calcification. No aneurysmal dilatation or dissection. No periaortic fluid collection. Celiac: Atherosclerotic calcification of the origin of the celiac trunk. The celiac artery and its major branches are patent. SMA: Atherosclerotic calcification of the origin of the SMA. The SMA  is patent. Renals: The renal arteries remain patent. IMA: The IMA is patent Inflow: Atherosclerotic calcification of the iliac arteries. The iliac arteries are patent. Veins: No obvious venous abnormality within the limitations of this arterial phase study. Review of the MIP images confirms the above findings. NON-VASCULAR No intra-abdominal free air or free fluid. Hepatobiliary: There is unremarkable. Cholecystectomy. No retained calcified stone in the central CBD. Mild dilatation of the common bile duct, post  cholecystectomy. Pancreas: Unremarkable. No pancreatic ductal dilatation or surrounding inflammatory changes. Spleen: Normal in size without focal abnormality. Adrenals/Urinary Tract: The adrenal glands unremarkable. Moderate bilateral renal parenchyma atrophy. There are 2 adjacent stones in the left renal pelvis with combined length of approximately 2.3 cm. There is mild left hydronephrosis. Several additional nonobstructing left renal calculi noted. There is a 1.3 cm stone in the upper pole of the right kidney. There is a cyst in the upper pole of the right kidney versus focal pelviectasis. Additional smaller nonobstructing right renal calculi noted. Several high attenuating lesions arising from the right kidney are not characterized on this CT but may represent proteinaceous cysts. These can be better evaluated with ultrasound on a nonemergent/outpatient basis. The visualized ureters appear unremarkable. Multiple bladder diverticula noted. Stomach/Bowel: There is sigmoid diverticulosis without active inflammatory changes. There is no bowel obstruction or active inflammation. The appendix is normal. Lymphatic: No adenopathy. Reproductive: The prostate gland is enlarged measuring 6 cm in transverse axial diameter. Other: None Musculoskeletal: Osteopenia with degenerative changes of the spine. No acute osseous pathology. Review of the MIP images confirms the above findings. IMPRESSION: 1. No aortic aneurysm or dissection. 2. Interval increase in the size of the posterior mediastinal soft tissue mass encasing distal descending thoracic aorta. Differential diagnosis include lymphoma, a neurogenic tumor, or less likely extra medullary hematopoiesis. Further characterization with MRI without and with contrast recommended. 3. Small left and trace right pleural effusions with bibasilar compressive atelectasis. Pneumonia is not excluded. 4. Bilateral nonobstructing renal calculi. Mild hydronephrosis on the left. 5. Sigmoid  diverticulosis without active inflammatory changes. No bowel obstruction. Normal appendix. 6. Enlarged prostate gland. Electronically Signed   By: Anner Crete M.D.   On: 10/10/2022 19:07   CT RENAL STONE STUDY  Addendum Date: 10/10/2022   ADDENDUM REPORT: 10/10/2022 16:38 ADDENDUM: These results were called by telephone at the time of interpretation on 10/10/2022 at 4:37 pm to provider [facility] , who verbally acknowledged these results. Electronically Signed   By: Ronney Asters M.D.   On: 10/10/2022 16:38   Result Date: 10/10/2022 CLINICAL DATA:  Nephrolithiasis. EXAM: CT ABDOMEN AND PELVIS WITHOUT CONTRAST TECHNIQUE: Multidetector CT imaging of the abdomen and pelvis was performed following the standard protocol without IV contrast. RADIATION DOSE REDUCTION: This exam was performed according to the departmental dose-optimization program which includes automated exposure control, adjustment of the mA and/or kV according to patient size and/or use of iterative reconstruction technique. COMPARISON:  CT abdomen and pelvis 05/25/2022 FINDINGS: Lower chest: Small left pleural effusion has decreased. There is a trace right pleural effusion. There is some atelectasis in both lung bases. Para-aortic heterogeneous density surrounding the entire aorta has increased in size now measuring 7.3 x 10.2 cm at the diaphragm (previously 5.1 x 4.4 cm). Hepatobiliary: No focal liver abnormality is seen. Status post cholecystectomy. No biliary dilatation. Pancreas: Unremarkable. No pancreatic ductal dilatation or surrounding inflammatory changes. Spleen: Normal in size without focal abnormality. Adrenals/Urinary Tract: Mild left hydronephrosis persists. There are 2 calculi within the left renal  pelvis measuring up to 1 cm in the ureteropelvic junction, unchanged. Nonobstructing left renal calculi in the inferior pole measuring up to 4 mm are unchanged. Nonobstructing right renal calculi are present measuring up to 15  mm. Rounded hyperdense lesion in the right kidney measures 2 cm and appears unchanged, most likely proteinaceous cyst. The adrenal glands are within normal limits. Bladder diverticula are again noted.  No bladder calcifications. Stomach/Bowel: Stomach is within normal limits. No evidence of bowel wall thickening, distention, or inflammatory changes. The appendix is not seen. There is colonic diverticulosis. Vascular/Lymphatic: There are atherosclerotic calcifications of the aorta. Aorta and IVC are normal in size. No enlarged lymph nodes. Reproductive: Prostate gland is enlarged, unchanged. Other: No ascites. Small fat containing right inguinal hernia. Injection sites are seen in the subcutaneous tissues of the anterior abdominal wall. Musculoskeletal: Degenerative changes affect the spine IMPRESSION: 1. Enlarging periaortic heterogeneous density surrounding the entire aorta concerning for hematoma. This can be further evaluated with CTA of the chest. 2. Stable mild left hydronephrosis secondary to 2 calculi in the left renal pelvis measuring up to 1 cm. 3. Bilateral nonobstructing renal calculi are unchanged. 4. Colonic diverticulosis. 5. Stable bladder diverticula. 6. Small left and trace right pleural effusions. 7. Prostatomegaly, unchanged. 8. Subcentimeter right Bosniak II renal cyst, too small to characterize. No follow-up imaging is recommended. JACR 2018 Feb; 264-273, Management of the Incidental Renal Mass on CT, [employer] 2021; [id number], Bosniak Classification of Cystic Renal Masses, Version 2019. Aortic Atherosclerosis (ICD10-I70.0). Electronically Signed: By: Ronney Asters M.D. On: 10/10/2022 16:29   DG Chest 2 View  Result Date: 10/09/2022 CLINICAL DATA:  Status post fall. EXAM: CHEST - 2 VIEW COMPARISON:  AP chest 04/06/2021 and chest two views 09/20/2019 FINDINGS: Left chest wall cardiac pacer is again seen with leads overlying the right atrium right ventricle. Cardiac silhouette appears  mildly enlarged. Mediastinal contours are unchanged and within normal limits with mild-to-moderate calcification within the aortic arch. Small left pleural effusion. Mild left basilar interstitial thickening. Minimal bilateral diffuse interstitial thickening. No pneumothorax is seen. Mild dextrocurvature of the midthoracic spine. Cholecystectomy clips. IMPRESSION: 1. Small left pleural effusion. 2. Mild left basilar interstitial thickening which could represent minimal interstitial pulmonary edema. 3. Cardiac silhouette is again mildly enlarged. Electronically Signed   By: Yvonne Kendall M.D.   On: 10/09/2022 16:00   DG Knee Complete 4 Views Right  Result Date: 10/09/2022 CLINICAL DATA:  Right knee pain status post fall EXAM: RIGHT KNEE - COMPLETE 4+ VIEW COMPARISON:  None Available. FINDINGS: Severe medial compartment joint space narrowing. Mild-to-moderate lateral and mild medial compartment chondrocalcinosis. Minimal inferior patellar degenerative osteophytosis. No joint effusion. No acute fracture is seen. No dislocation. IMPRESSION: 1. No acute fracture. 2. Severe medial compartment osteoarthritis. Electronically Signed   By: Yvonne Kendall M.D.   On: 10/09/2022 15:20   DG Knee Complete 4 Views Left  Result Date: 10/09/2022 CLINICAL DATA:  Left knee pain status post fall EXAM: LEFT KNEE - COMPLETE 4+ VIEW COMPARISON:  None Available. FINDINGS: Moderate medial compartment joint space narrowing with mild-to-moderate peripheral degenerative osteophytosis. Mild inferior greater than superior patellar degenerative osteophytosis. Tiny joint effusion. No acute fracture is seen. No dislocation. There are two adjacent 10 mm and 9 mm ossicles overlying the posteromedial soft tissues at the level of the proximal tibia, possibly within a Baker's cyst. Mild-to-moderate vascular calcifications. IMPRESSION: 1. No acute fracture. 2. Moderate medial compartment and mild patellofemoral compartment osteoarthritis. 3.  There are two possible  loose bodies within a posterior knee Baker's cyst. Electronically Signed   By: Yvonne Kendall M.D.   On: 10/09/2022 15:18   DG Elbow 2 Views Right  Result Date: 10/09/2022 CLINICAL DATA:  Status post fall. EXAM: RIGHT ELBOW - 2 VIEW COMPARISON:  None Available. FINDINGS: There is normal positioning of the central humeral fat pad without an elbow joint effusion seen. No acute fracture line is seen in the provided frontal and lateral views. Minimal medial elbow joint space narrowing with associated peripheral trochlear and coronoid process degenerative spurring. No dislocation. IMPRESSION: 1. No acute fracture is seen. 2. Mild medial elbow osteoarthritis. Electronically Signed   By: Yvonne Kendall M.D.   On: 10/09/2022 15:16   DG Pelvis 1-2 Views  Result Date: 10/09/2022 CLINICAL DATA:  Fall. EXAM: PELVIS - 1-2 VIEW COMPARISON:  CT abdomen and pelvis 05/25/2022 FINDINGS: There is diffuse decreased bone mineralization. Mild bilateral sacroiliac subchondral sclerosis without joint space narrowing. The pubic symphysis joint space is maintained. No acute fracture is seen. No dislocation. Mild bilateral superolateral acetabular degenerative osteophytosis. Moderate stool within the rectum. IMPRESSION: 1. No acute fracture. 2. Mild bilateral hip osteoarthritis. Electronically Signed   By: Yvonne Kendall M.D.   On: 10/09/2022 15:14   CT Maxillofacial Wo Contrast  Result Date: 10/09/2022 CLINICAL DATA:  Provided history: Facial trauma, blunt. Neck trauma. Head trauma, minor. Fall. Left periorbital and facial swelling. EXAM: CT MAXILLOFACIAL WITHOUT CONTRAST CT CERVICAL SPINE WITHOUT CONTRAST TECHNIQUE: Multidetector CT imaging of the cervical spine and maxillofacial structures was performed using the standard protocol without intravenous contrast. Multiplanar CT image reconstructions of the cervical spine and maxillofacial structures were also generated. RADIATION DOSE REDUCTION: This exam  was performed according to the departmental dose-optimization program which includes automated exposure control, adjustment of the mA and/or kV according to patient size and/or use of iterative reconstruction technique. COMPARISON:  Cervical spine CT 01/04/2019.  Head CT 09/13/2021. FINDINGS: CT MAXILLOFACIAL FINDINGS Osseous: No acute maxillofacial fracture is identified. Orbits: Forehead, left facial and left periorbital soft tissue swelling/hematoma. Sinuses: 14 mm mucous retention cyst, and background mild mucosal thickening, within the right maxillary sinus. Minimal mucosal thickening versus small mucous retention cyst within the left maxillary sinus. Minimal mucosal thickening within the bilateral sphenoid sinuses. Soft tissues: Forehead, left facial and left periorbital soft tissue swelling/hematoma. CT CERVICAL SPINE FINDINGS Alignment: Nonspecific reversal of the expected cervical lordosis. Dextrocurvature of the cervical and upper thoracic spine, centered at the cervicothoracic junction. No significant spondylolisthesis. Skull base and vertebrae: The basion-dental and atlanto-dental intervals are maintained.No evidence of acute fracture to the cervical spine. Soft tissues and spinal canal: No prevertebral fluid or swelling. No visible canal hematoma. Disc levels: Cervical spondylosis with multilevel disc space narrowing, disc bulges, posterior disc osteophyte complexes, endplate spurring, uncovertebral hypertrophy and facet arthrosis. Vertebral ankylosis at C4-C5, C5-C6 and C6-C7. No appreciable high-grade spinal canal stenosis. Multilevel bony neural foraminal narrowing. Degenerative changes are also present about the C1-C2 articulation. Upper chest: No consolidation within the imaged lung apices. No visible pneumothorax. IMPRESSION: Maxillofacial CT: 1. No acute maxillofacial fracture is identified. 2. Forehead, left facial and left periorbital soft tissue swelling/hematoma. 3. Paranasal sinus disease,  as described. CT cervical spine: 1. No evidence of acute fracture to the cervical spine. 2. Nonspecific reversal of the expected cervical lordosis. 3. Dextrocurvature of the cervical and upper thoracic spine, possibly positional. 4. Cervical spondylosis and multilevel vertebral ankylosis, as described. Electronically Signed   By: Kellie Simmering D.O.  On: 10/09/2022 14:58   CT Cervical Spine Wo Contrast  Result Date: 10/09/2022 CLINICAL DATA:  Provided history: Facial trauma, blunt. Neck trauma. Head trauma, minor. Fall. Left periorbital and facial swelling. EXAM: CT MAXILLOFACIAL WITHOUT CONTRAST CT CERVICAL SPINE WITHOUT CONTRAST TECHNIQUE: Multidetector CT imaging of the cervical spine and maxillofacial structures was performed using the standard protocol without intravenous contrast. Multiplanar CT image reconstructions of the cervical spine and maxillofacial structures were also generated. RADIATION DOSE REDUCTION: This exam was performed according to the departmental dose-optimization program which includes automated exposure control, adjustment of the mA and/or kV according to patient size and/or use of iterative reconstruction technique. COMPARISON:  Cervical spine CT 01/04/2019.  Head CT 09/13/2021. FINDINGS: CT MAXILLOFACIAL FINDINGS Osseous: No acute maxillofacial fracture is identified. Orbits: Forehead, left facial and left periorbital soft tissue swelling/hematoma. Sinuses: 14 mm mucous retention cyst, and background mild mucosal thickening, within the right maxillary sinus. Minimal mucosal thickening versus small mucous retention cyst within the left maxillary sinus. Minimal mucosal thickening within the bilateral sphenoid sinuses. Soft tissues: Forehead, left facial and left periorbital soft tissue swelling/hematoma. CT CERVICAL SPINE FINDINGS Alignment: Nonspecific reversal of the expected cervical lordosis. Dextrocurvature of the cervical and upper thoracic spine, centered at the cervicothoracic  junction. No significant spondylolisthesis. Skull base and vertebrae: The basion-dental and atlanto-dental intervals are maintained.No evidence of acute fracture to the cervical spine. Soft tissues and spinal canal: No prevertebral fluid or swelling. No visible canal hematoma. Disc levels: Cervical spondylosis with multilevel disc space narrowing, disc bulges, posterior disc osteophyte complexes, endplate spurring, uncovertebral hypertrophy and facet arthrosis. Vertebral ankylosis at C4-C5, C5-C6 and C6-C7. No appreciable high-grade spinal canal stenosis. Multilevel bony neural foraminal narrowing. Degenerative changes are also present about the C1-C2 articulation. Upper chest: No consolidation within the imaged lung apices. No visible pneumothorax. IMPRESSION: Maxillofacial CT: 1. No acute maxillofacial fracture is identified. 2. Forehead, left facial and left periorbital soft tissue swelling/hematoma. 3. Paranasal sinus disease, as described. CT cervical spine: 1. No evidence of acute fracture to the cervical spine. 2. Nonspecific reversal of the expected cervical lordosis. 3. Dextrocurvature of the cervical and upper thoracic spine, possibly positional. 4. Cervical spondylosis and multilevel vertebral ankylosis, as described. Electronically Signed   By: Kellie Simmering D.O.   On: 10/09/2022 14:58   CT Head Wo Contrast  Result Date: 10/09/2022 CLINICAL DATA:  Trauma, fall EXAM: CT HEAD WITHOUT CONTRAST TECHNIQUE: Contiguous axial images were obtained from the base of the skull through the vertex without intravenous contrast. RADIATION DOSE REDUCTION: This exam was performed according to the departmental dose-optimization program which includes automated exposure control, adjustment of the mA and/or kV according to patient size and/or use of iterative reconstruction technique. COMPARISON:  09/13/2021 FINDINGS: Brain: No acute intracranial findings are seen. There are no signs of bleeding within the cranium.  Cortical sulci are prominent. There is decreased density in periventricular white matter. Small old infarct is seen in the left occipital lobe. Dense calcifications in the falx and left frontal region appear stable. Vascular: There are scattered arterial calcifications. Skull: No recent fracture is seen. There is subcutaneous contusion/hematoma in the frontal scalp. Sinuses/Orbits: Unremarkable. Other: None. IMPRESSION: No acute intracranial findings are seen. Atrophy. 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 No significant interval changes are noted. There is subcutaneous contusion/hematoma in the frontal scalp. No fracture is seen in calvarium. Electronically Signed   By: Elmer Picker M.D.   On: 10/09/2022 14:44   CUP PACEART REMOTE DEVICE CHECK  Result Date: 09/26/2022 Scheduled remote reviewed. Normal device function.  Next remote 91 days. LA    Discharge Exam: Vitals:   10/11/22 2000 10/12/22 0344  BP: 119/65 112/61  Pulse: 71 82  Resp: 20 18  Temp: 98.2 F (36.8 C) 98.7 F (37.1 C)  SpO2: 97% 95%   Vitals:   10/11/22 0414 10/11/22 1325 10/11/22 2000 10/12/22 0344  BP: 136/69 (!) 142/66 119/65 112/61  Pulse: 74 75 71 82  Resp: '16 18 20 18  '$ Temp: 98.5 F (36.9 C) 97.7 F (36.5 C) 98.2 F (36.8 C) 98.7 F (37.1 C)  TempSrc: Oral Oral Oral Oral  SpO2: 96% 100% 97% 95%  Weight:      Height:        General: Pt is alert, awake, not in acute distress Cardiovascular: RRR, S1/S2 +, no rubs, no gallops Respiratory: CTA bilaterally, no wheezing, no rhonchi Abdominal: Soft, NT, ND, bowel sounds + Extremities: no edema, no cyanosis    The results of significant diagnostics from this hospitalization (including imaging, microbiology, ancillary and laboratory) are listed below for reference.     Microbiology: Recent Results (from the past 240 hour(s))  Microscopic Examination     Status: Abnormal   Collection Time: 10/06/22 12:14 PM   Urine  Result Value Ref  Range Status   WBC, UA >30 (A) 0 - 5 /hpf Final   RBC, Urine 3-10 (A) 0 - 2 /hpf Final   Epithelial Cells (non renal) 0-10 0 - 10 /hpf Final   Casts Present (A) None seen /lpf Final   Cast Type Hyaline casts N/A Final   Mucus, UA Present (A) Not Estab. Final   Bacteria, UA Few None seen/Few Final  Urine Culture     Status: Abnormal   Collection Time: 10/06/22 12:40 PM   Specimen: Urine   UR  Result Value Ref Range Status   Urine Culture, Routine Final report (A)  Final   Organism ID, Bacteria Enterococcus faecalis (A)  Final    Comment: For Enterococcus species, aminoglycosides (except for high-level resistance screening), cephalosporins, clindamycin, and trimethoprim-sulfamethoxazole are not effective clinically. (CLSI, M100-S26, 2016) 50,000-100,000 colony forming units per mL    ORGANISM ID, BACTERIA Comment  Final    Comment: Mixed urogenital flora Less than 10,000 colonies/mL    Antimicrobial Susceptibility Comment  Final    Comment:       ** S = Susceptible; I = Intermediate; R = Resistant **                    P = Positive; N = Negative             MICS are expressed in micrograms per mL    Antibiotic                 RSLT#1    RSLT#2    RSLT#3    RSLT#4 Ciprofloxacin                  S Levofloxacin                   S Nitrofurantoin                 S Penicillin                     S Tetracycline                   R Vancomycin  S   Urine Culture     Status: Abnormal   Collection Time: 10/09/22  4:55 PM   Specimen: Urine, Clean Catch  Result Value Ref Range Status   Specimen Description   Final    URINE, CLEAN CATCH Performed at [facility], [address], [place], [place] [address]    Special Requests   Final    NONE Performed at [facility], [address] [place], [place] [address]    Culture (A)  Final    <10,000 COLONIES/mL INSIGNIFICANT GROWTH Performed at [facility] [address], [place], [place] [address]    Report  Status 10/11/2022 FINAL  Final     Labs: BNP (last 3 results) No results for input(s): "BNP" in the last 8760 hours. Basic Metabolic Panel: Recent Labs  Lab 10/09/22 1415 10/10/22 0403 10/11/22 0356 10/12/22 0324  NA 139 141 138 139  K 4.3 3.8 3.9 4.0  CL 102 107 104 104  CO2 '26 26 26 27  '$ GLUCOSE 150* 102* 99 99  BUN 45* 43* 34* 31*  CREATININE 1.61* 1.27* 1.10 1.08  CALCIUM 10.2 9.3 8.8* 9.0  MG 2.1  --   --  1.9   Liver Function Tests: Recent Labs  Lab 10/09/22 1415 10/10/22 0403 10/11/22 0356 10/12/22 0324  AST 208* 176* 189* 114*  ALT 49* 48* 65* 57*  ALKPHOS 80 60 64 58  BILITOT 1.5* 1.1 1.0 0.8  PROT 7.0 5.6* 5.6* 5.0*  ALBUMIN 3.6 3.0* 2.8* 2.5*   No results for input(s): "LIPASE", "AMYLASE" in the last 168 hours. No results for input(s): "AMMONIA" in the last 168 hours. CBC: Recent Labs  Lab 10/09/22 1415 10/10/22 0403 10/11/22 0356 10/12/22 0324  WBC 23.1* 18.7* 12.0* 7.8  HGB 15.3 13.5 14.4 13.1  HCT 44.2 38.8* 41.9 38.4*  MCV 99.1 100.3* 100.0 101.3*  PLT 165 144* 157 148*   Cardiac Enzymes: Recent Labs  Lab 10/09/22 1415 10/10/22 0403 10/11/22 0356 10/12/22 0324  CKTOTAL 7,823* 4,290* 3,407* 980*   BNP: Invalid input(s): "POCBNP" CBG: No results for input(s): "GLUCAP" in the last 168 hours. D-Dimer No results for input(s): "DDIMER" in the last 72 hours. Hgb A1c No results for input(s): "HGBA1C" in the last 72 hours. Lipid Profile No results for input(s): "CHOL", "HDL", "LDLCALC", "TRIG", "CHOLHDL", "LDLDIRECT" in the last 72 hours. Thyroid function studies No results for input(s): "TSH", "T4TOTAL", "T3FREE", "THYROIDAB" in the last 72 hours.  Invalid input(s): "FREET3" Anemia work up No results for input(s): "VITAMINB12", "FOLATE", "FERRITIN", "TIBC", "IRON", "RETICCTPCT" in the last 72 hours. Urinalysis    Component Value Date/Time   COLORURINE AMBER (A) 10/09/2022 1655   APPEARANCEUR HAZY (A) 10/09/2022 1655    APPEARANCEUR Cloudy (A) 10/06/2022 1214   LABSPEC 1.019 10/09/2022 1655   PHURINE 5.0 10/09/2022 1655   GLUCOSEU 50 (A) 10/09/2022 1655   HGBUR LARGE (A) 10/09/2022 1655   BILIRUBINUR NEGATIVE 10/09/2022 1655   BILIRUBINUR Negative 10/06/2022 1214   KETONESUR 5 (A) 10/09/2022 1655   PROTEINUR 100 (A) 10/09/2022 1655   UROBILINOGEN 0.2 04/08/2013 1815   NITRITE NEGATIVE 10/09/2022 1655   LEUKOCYTESUR LARGE (A) 10/09/2022 1655   Sepsis Labs Recent Labs  Lab 10/09/22 1415 10/10/22 0403 10/11/22 0356 10/12/22 0324  WBC 23.1* 18.7* 12.0* 7.8   Microbiology Recent Results (from the past 240 hour(s))  Microscopic Examination     Status: Abnormal   Collection Time: 10/06/22 12:14 PM   Urine  Result Value Ref Range Status  WBC, UA >30 (A) 0 - 5 /hpf Final   RBC, Urine 3-10 (A) 0 - 2 /hpf Final   Epithelial Cells (non renal) 0-10 0 - 10 /hpf Final   Casts Present (A) None seen /lpf Final   Cast Type Hyaline casts N/A Final   Mucus, UA Present (A) Not Estab. Final   Bacteria, UA Few None seen/Few Final  Urine Culture     Status: Abnormal   Collection Time: 10/06/22 12:40 PM   Specimen: Urine   UR  Result Value Ref Range Status   Urine Culture, Routine Final report (A)  Final   Organism ID, Bacteria Enterococcus faecalis (A)  Final    Comment: For Enterococcus species, aminoglycosides (except for high-level resistance screening), cephalosporins, clindamycin, and trimethoprim-sulfamethoxazole are not effective clinically. (CLSI, M100-S26, 2016) 50,000-100,000 colony forming units per mL    ORGANISM ID, BACTERIA Comment  Final    Comment: Mixed urogenital flora Less than 10,000 colonies/mL    Antimicrobial Susceptibility Comment  Final    Comment:       ** S = Susceptible; I = Intermediate; R = Resistant **                    P = Positive; N = Negative             MICS are expressed in micrograms per mL    Antibiotic                 RSLT#1    RSLT#2    RSLT#3     RSLT#4 Ciprofloxacin                  S Levofloxacin                   S Nitrofurantoin                 S Penicillin                     S Tetracycline                   R Vancomycin                     S   Urine Culture     Status: Abnormal   Collection Time: 10/09/22  4:55 PM   Specimen: Urine, Clean Catch  Result Value Ref Range Status   Specimen Description   Final    URINE, CLEAN CATCH Performed at [facility], [address] [place], [place] [address]    Special Requests   Final    NONE Performed at [facility], [address], [place], [place] [address]    Culture (A)  Final    <10,000 COLONIES/mL INSIGNIFICANT GROWTH Performed at [facility], [place] [address] [place], [place] [address]    Report Status 10/11/2022 FINAL  Final     Time coordinating discharge: 35 minutes  SIGNED:   Rodena Goldmann, DO [facility] 10/12/2022, 10:16 AM  If 7PM-7AM, please contact night-coverage [email or web address]

## 2022-10-12 NOTE — TOC Transition Note (Signed)
Transition of Care [facility]) - CM/SW Discharge Note   Patient Details  Name: Chase Chase MRN: [id number] Date of Birth: [birth year]  Transition of Care [facility]) CM/SW Contact:  Boneta Lucks, RN Phone Number: 10/12/2022, 11:48 AM   Clinical Narrative:   Patient is medically ready discharge. Family accepted bed offer at [facility] and explained that he will DC there today. DC summary sent to [facility], RN to call report.     Final next level of care: Skilled Nursing Facility Barriers to Discharge: Continued Medical Work up   Patient Goals and CMS Choice Patient states their goals for this hospitalization and ongoing recovery are:: agreeable to SNF CMS Medicare.gov Compare Post Acute Care list provided to:: Patient Represenative (must comment) Choice offered to / list presented to : Adult Children  Discharge Placement                Patient to be transferred to facility by: [facility] staff Name of family member notified: Langley Gauss Patient and family notified of of transfer: 10/12/22  Discharge Plan and Services    [facility]       Readmission Risk Interventions    10/12/2022   11:47 AM 10/11/2022   11:09 AM  Readmission Risk Prevention Plan  Post Dischage Appt Complete Not Complete  Medication Screening  Complete  Transportation Screening  Complete

## 2022-10-12 NOTE — Care Management Important Message (Signed)
Important Message  Patient Details  Name: Chase Chase MRN: [id number] Date of Birth: 26-Apr-1922   Medicare Important Message Given:  N/A - LOS <3 / Initial given by admissions     Tommy Medal 10/12/2022, 12:08 PM

## 2022-10-12 NOTE — Plan of Care (Signed)
  Problem: Clinical Measurements: Goal: Diagnostic test results will improve Outcome: Not Progressing   Problem: Activity: Goal: Risk for activity intolerance will decrease Outcome: Not Progressing   Problem: Coping: Goal: Level of anxiety will decrease Outcome: Not Progressing   Problem: Safety: Goal: Ability to remain free from injury will improve Outcome: Not Progressing

## 2022-10-12 NOTE — Progress Notes (Signed)
Patient stable and ready for [facility]. Patient neg for covid. Patient family at bedside time of transported to [facility] room 125. Patient transferred via chair by NT x2. IV removed without issues. Paperwork given to receiving nurse.

## 2022-10-13 ENCOUNTER — Encounter: Payer: Self-pay | Admitting: Adult Health

## 2022-10-13 ENCOUNTER — Non-Acute Institutional Stay (SKILLED_NURSING_FACILITY): Payer: Medicare Other | Admitting: Adult Health

## 2022-10-13 DIAGNOSIS — I700 Atherosclerosis of aorta: Secondary | ICD-10-CM | POA: Diagnosis not present

## 2022-10-13 DIAGNOSIS — E039 Hypothyroidism, unspecified: Secondary | ICD-10-CM | POA: Diagnosis not present

## 2022-10-13 DIAGNOSIS — N179 Acute kidney failure, unspecified: Secondary | ICD-10-CM | POA: Diagnosis not present

## 2022-10-13 DIAGNOSIS — N400 Enlarged prostate without lower urinary tract symptoms: Secondary | ICD-10-CM | POA: Diagnosis not present

## 2022-10-13 DIAGNOSIS — G9341 Metabolic encephalopathy: Secondary | ICD-10-CM | POA: Diagnosis not present

## 2022-10-13 DIAGNOSIS — H409 Unspecified glaucoma: Secondary | ICD-10-CM | POA: Diagnosis not present

## 2022-10-13 DIAGNOSIS — T796XXD Traumatic ischemia of muscle, subsequent encounter: Secondary | ICD-10-CM | POA: Diagnosis not present

## 2022-10-13 DIAGNOSIS — I442 Atrioventricular block, complete: Secondary | ICD-10-CM | POA: Diagnosis not present

## 2022-10-13 DIAGNOSIS — J9859 Other diseases of mediastinum, not elsewhere classified: Secondary | ICD-10-CM | POA: Diagnosis not present

## 2022-10-13 LAB — SARS CORONAVIRUS 2 (TAT 6-24 HRS): SARS Coronavirus 2: NEGATIVE

## 2022-10-13 NOTE — Progress Notes (Signed)
Remote pacemaker transmission.   

## 2022-10-13 NOTE — Progress Notes (Unsigned)
Location:  [place] Room Number: 125-P Place of Service:  SNF (31)   CODE STATUS: DNR  No Known Allergies  Chief Complaint  Patient presents with   Hospitalization Follow-up    HPI:  He is a 88 year old man who has been hospitalized from 10-09-22 through 10-12-22. His medical history includes: CHB with pace maker; mediastinal mass. He presented to the ED after having a fall; his family found him on the floor. He was AKI and rhabdomyolysis. His urine culture grew <10,000 colonies. He was treated abt and IVF. He is here for short term rehab with his goal to return back home. He had been living independently with family checking in on him. More than likely he will now require more care at home. He continues to be followed for his chronic illnesses including: CHB (complete heart block): Glaucoma of both eyes unspecified glaucoma type:  Benign prostatic hypertrophy unspecified whether lower urinary tract symptoms present: Hypothyroidism acquired   Past Medical History:  Diagnosis Date   At risk of UTI    CHB (complete heart block) ([place]) 09/2019   CHB (complete heart block) ([place]) 09/2019   Glaucoma    Glaucoma    HOH (hard of hearing)    Hypercholesterolemia    Pacemaker 09/19/2019   Biotronik dual chamber PPM   Prostate disease    unspecified   Vertigo     Past Surgical History:  Procedure Laterality Date   CHOLECYSTECTOMY     HERNIA REPAIR     bilateral   PACEMAKER IMPLANT N/A 09/19/2019   Procedure: PACEMAKER IMPLANT;  Surgeon: Evans Lance, MD;  Location: [place] [facility];  Service: Cardiovascular;  Laterality: N/A;    Social History   Socioeconomic History   Marital status: Widowed    Spouse name: Not on file   Number of children: Not on file   Years of education: Not on file   Highest education level: Not on file  Occupational History   Occupation: retired  Tobacco Use   Smoking status: Former    Packs/day: 2.00    Years: 8.00     Total pack years: 16.00    Types: Cigarettes   Smokeless tobacco: Former    Types: Chew    Quit date: 10/12/2011   Tobacco comments:    Chewing and snuff  Vaping Use   Vaping Use: Never used  Substance and Sexual Activity   Alcohol use: No   Drug use: No   Sexual activity: Not on file  Other Topics Concern   Not on file  Social History Narrative   Not on file   Social Determinants of Health   Financial Resource Strain: Not on file  Food Insecurity: No Food Insecurity (10/09/2022)   Hunger Vital Sign    Worried About Running Out of Food in the Last Year: Never true    Ran Out of Food in the Last Year: Never true  Transportation Needs: No Transportation Needs (10/09/2022)   PRAPARE - Hydrologist (Medical): No    Lack of Transportation (Non-Medical): No  Physical Activity: Not on file  Stress: Not on file  Social Connections: Not on file  Intimate Partner Violence: Not At Risk (10/09/2022)   Humiliation, Afraid, Rape, and Kick questionnaire    Fear of Current or Ex-Partner: No    Emotionally Abused: No    Physically Abused: No    Sexually Abused: No   Family History  Problem Relation Age of Onset   Breast cancer Sister    Lung cancer Brother    Lung cancer Brother    Lung cancer Brother    Breast cancer Sister    Breast cancer Sister       VITAL SIGNS BP (!) 140/74   Pulse 84   Temp (!) 97.3 F (36.3 C)   Resp 18   Ht '5\' 7"'$  (1.702 m)   Wt 170 lb (77.1 kg)   SpO2 93%   BMI 26.63 kg/m   Outpatient Encounter Medications as of 10/13/2022  Medication Sig   alfuzosin (UROXATRAL) 10 MG 24 hr tablet Take 1 tablet (10 mg total) by mouth at bedtime.   Cholecalciferol (VITAMIN D3) 125 MCG (5000 UT) CAPS Take 5,000 Units by mouth See admin instructions. Take 1 capsule by mouth for 10 days every month   latanoprost (XALATAN) 0.005 % ophthalmic solution Place 1 drop into both eyes at bedtime as needed.    levothyroxine (SYNTHROID) 50 MCG  tablet Take 50 mcg by mouth daily.   lidocaine (XYLOCAINE) 2 % solution    meclizine (ANTIVERT) 25 MG tablet Take 25 mg by mouth as needed for dizziness (rarely needs).   Misc Natural Products (PROSTATE HEALTH PO) Take 2 tablets by mouth daily.   Omega-3 Fatty Acids (FISH OIL) 645 MG CAPS Take 1,290 mg by mouth daily. Take 2 capsules daily for cholesterol.   zinc gluconate 50 MG tablet Take 50 mg by mouth daily.   [DISCONTINUED] cefdinir (OMNICEF) 300 MG capsule Take 1 capsule (300 mg total) by mouth 2 (two) times daily for 7 days.   [DISCONTINUED] Diphenhyd-Hydrocort-Nystatin (FIRST-DUKES MOUTHWASH MT) Use as directed in the mouth or throat.   No facility-administered encounter medications on file as of 10/13/2022.     SIGNIFICANT DIAGNOSTIC EXAMS  TODAY  10-09-22: ct of head:  No acute intracranial findings are seen. Atrophy. 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 No significant interval changes are noted. There is subcutaneous contusion/hematoma in the frontal scalp. No fracture is seen in calvarium.  LABS REVIEWED TODAY  10-09-22: wbc 23.1; hgb 15.3; hct 44.2; mcv 99.1 plt 165; glucose 150; bun 45; creat 1.61; k+ 4.3; na++ 139; ca 10.2;gfr 38; protein 7.0 albumin 3.6 urine culture: <10,000. CK 7823 10-12-22: wbc 7.8; hgb 13.1; hct 38.4; mcv 101.3 plt 148; glucose 99; bun 31; creat 1.08; k+ 4.0; na++ 139; ca 9.0; gfr >60; protein 5.0; albumin 2.5; ast 114; alt 57    Review of Systems  Constitutional:  Negative for malaise/fatigue.  Respiratory:  Negative for cough and shortness of breath.   Cardiovascular:  Negative for chest pain, palpitations and leg swelling.  Gastrointestinal:  Negative for abdominal pain, constipation and heartburn.  Musculoskeletal:  Negative for back pain, joint pain and myalgias.  Skin: Negative.   Neurological:  Negative for dizziness.  Psychiatric/Behavioral:  The patient is not nervous/anxious.     Physical Exam Constitutional:       General: He is not in acute distress.    Appearance: He is well-developed. He is not diaphoretic.  Neck:     Thyroid: No thyromegaly.  Cardiovascular:     Rate and Rhythm: Normal rate and regular rhythm.     Pulses: Normal pulses.     Heart sounds: Normal heart sounds.  Pulmonary:     Effort: Pulmonary effort is normal. No respiratory distress.     Breath sounds: Normal breath sounds.  Abdominal:     General: Bowel sounds are  normal. There is no distension.     Palpations: Abdomen is soft.     Tenderness: There is no abdominal tenderness.  Musculoskeletal:        General: Normal range of motion.     Cervical back: Neck supple.     Right lower leg: No edema.     Left lower leg: No edema.     Comments: Kyphosis   Lymphadenopathy:     Cervical: No cervical adenopathy.  Skin:    General: Skin is warm and dry.  Neurological:     Mental Status: He is alert. Mental status is at baseline.  Psychiatric:        Mood and Affect: Mood normal.      ASSESSMENT/ PLAN:  TODAY  Acute metabolic encephalopathy /traumatic rhabdomyolysis subsequent encounter: CK levels are trending downward. He is less confused today. Will stop abt as culture grew back <10,000 colonies present.    2. CHB (complete heart block): is status post pace maker insertion  3. AKI (acute kidney injury) renal function has returned to baseline with creat 1.08 and gfr60  4. Glaucoma of both eyes unspecified glaucoma type: will continue xalatan to both eyes nightly   5. Benign prostatic hypertrophy unspecified whether lower urinary tract symptoms present: will continue uroxatral 10 mg daily   6. Hypothyroidism acquired; will continue synthroid 50 mcg daily   7. Aortic atherosclerosis: (ct 05-25-22) is not on statin due to advanced age  18. Mediastinal mass: has been followed by ct scans will monitor      Ok Edwards NP [facility]  call [phone]

## 2022-10-17 ENCOUNTER — Non-Acute Institutional Stay (SKILLED_NURSING_FACILITY): Payer: Medicare Other | Admitting: Internal Medicine

## 2022-10-17 ENCOUNTER — Encounter: Payer: Self-pay | Admitting: Internal Medicine

## 2022-10-17 DIAGNOSIS — N179 Acute kidney failure, unspecified: Secondary | ICD-10-CM

## 2022-10-17 DIAGNOSIS — T796XXD Traumatic ischemia of muscle, subsequent encounter: Secondary | ICD-10-CM | POA: Diagnosis not present

## 2022-10-17 DIAGNOSIS — E039 Hypothyroidism, unspecified: Secondary | ICD-10-CM | POA: Insufficient documentation

## 2022-10-17 DIAGNOSIS — N400 Enlarged prostate without lower urinary tract symptoms: Secondary | ICD-10-CM | POA: Insufficient documentation

## 2022-10-17 DIAGNOSIS — G9341 Metabolic encephalopathy: Secondary | ICD-10-CM

## 2022-10-17 DIAGNOSIS — I700 Atherosclerosis of aorta: Secondary | ICD-10-CM | POA: Insufficient documentation

## 2022-10-17 NOTE — Progress Notes (Signed)
NURSING HOME LOCATION:  [facility] ROOM NUMBER:  125  CODE STATUS:  DNR  PCP:  Sharilyn Sites MD  This is a comprehensive admission note to this SNFperformed on this date less than 30 days from date of admission. Included are preadmission medical/surgical history; reconciled medication list; family history; social history and comprehensive review of systems.  Corrections and additions to the records were documented. Comprehensive physical exam was also performed. Additionally a clinical summary was entered for each active diagnosis pertinent to this admission in the Problem List to enhance continuity of care.  HPI: He was hospitalized 10/30 - 10/12/2022 for AKI in the context of rhabdomyolysis and recent UTI.  According to his daughter , Leanora Ivanoff was last seen approximately 4:00 pm the day prior to discharge.  When they checked on him the next day they found him on the floor with evidence of extensive soft tissue trauma.  His daughter states that there was evidence that he had taken his evening medications , suggesting he fell after 9 pm possibly.He was apparently ambulating in the kitchen with a rolling walker at the time of the fall.  He can only tell me that he "fell down and got skint, beat up." At presentation BUN was 45, creatinine 1.61 and GFR 38 indicating AKI stage IIIb.  Transaminitis was present with an AST of 208 and ALT of 49.  CK was 7823. White blood count was 23,100; H/H was 15.3/44.2.  Protein/caloric malnutrition was documented with an albumin of 2.5 and total protein of 5.  He received judicious rehydration and at the time of discharge BUN was 31 , GFR greater than 60 and creatinine 1.08 indicating CKD stage II.  AST had improved 114; ALT was 57.  CK prior to discharge was 980.  White count normalized with a value of 7800.  Final H/H was 13.1/38.4.  MCV was noted to be 101.3.  There is no B12 on record. Family had been concerned as he lives alone but has been  adamant that he does not want placement.  PT/OT evaluated him and recommended SNF placement for debilitation.  Past medical and surgical history:Includes history of complete heart block, glaucoma, dyslipidemia, history of vertigo, and prostate disease. His recent procedures include hernia repair, pacemaker implant, and cholecystectomy.  Social history: Former smoker; nondrinker.  Family history: Noncontributory due to advanced age.   Review of systems: Clinical neurocognitive deficits made validity of responses questionable , preventing ROS completion. Date given as "November[birth year]  1951."  He could provide no details about his fall other than the comment noted above.  He stated that he was in the kitchen "fixing breakfast "although the family believes it was approximately 9 PM when the event occurred.  He did deny any cardio or neurologic prodrome.  He does describe some dysphagia.  Constitutional: No fever, significant weight change, fatigue  Eyes: No redness, discharge, pain, vision change ENT/mouth: No nasal congestion, purulent discharge, earache, change in hearing, sore throat  Cardiovascular: No chest pain, palpitations, paroxysmal nocturnal dyspnea, claudication, edema  Respiratory: No cough, sputum production, hemoptysis, DOE, significant snoring, apnea  Gastrointestinal: No heartburn, abdominal pain, nausea /vomiting, rectal bleeding, melena, change in bowels Genitourinary: No dysuria, hematuria, pyuria, incontinence, nocturia Dermatologic: No rash, pruritus Neurologic: No dizziness, headache, syncope, seizures, numbness, tingling Psychiatric: No significant anxiety, depression, insomnia, anorexia Endocrine: No change in hair/skin/nails, excessive thirst, excessive hunger, excessive urination  Hematologic/lymphatic: No significant bruising, lymphadenopathy, abnormal bleeding Allergy/immunology: No itchy/watery eyes, significant sneezing, urticaria,  angioedema  Physical exam:   Pertinent or positive findings: He appears his stated age and somewhat chronically ill and suboptimally nourished.  There is a large resolving ecchymoses above the left lateral eyebrow.  He is wearing hearing aids bilaterally.  Heart sounds are distant.  He has minor rales at the bases.  He has trace-1/2+ edema at the sock line.  Pedal pulses are decreased.  Eschar is noted over the left lateral wrist with minor ecchymoses over the right forearm.  Dressings are present over the knees.  General appearance: no acute distress, increased work of breathing is present.   Lymphatic: No lymphadenopathy about the head, neck, axilla. Eyes: No conjunctival inflammation or lid edema is present. There is no scleral icterus. Ears:  External ear exam shows no significant lesions or deformities.   Nose:  External nasal examination shows no deformity or inflammation. Nasal mucosa are pink and moist without lesions, exudates Oral exam: Lips and gums are healthy appearing.There is no oropharyngeal erythema or exudate. Neck:  No thyromegaly, masses, tenderness noted.    Heart:  No gallop, murmur, click, rub.  Lungs:  without wheezes, rhonchi, rubs. Abdomen: Bowel sounds are normal.  Abdomen is soft and nontender with no organomegaly, hernias, masses. GU: Deferred  Extremities:  No cyanosis, clubbing Neurologic exam:  Balance, Rhomberg, finger to nose testing could not be completed due to clinical state Skin: Warm & dry w/o tenting. No significant rash.  See clinical summary under each active problem in the Problem List with associated updated therapeutic plan

## 2022-10-18 ENCOUNTER — Encounter: Payer: Self-pay | Admitting: Internal Medicine

## 2022-10-18 NOTE — Assessment & Plan Note (Addendum)
At this time he can relate no details of his hospitalization.  He gave the date as "November?,  1951".  MMSE indicated if he indeed wants to return home to independent living.

## 2022-10-18 NOTE — Assessment & Plan Note (Signed)
At presentation AKI stage IIIb was present with creatinine of 1.61 and GFR 38.  With judicious rehydration creatinine 1.08 and GFR greater than 60, indicating CKD stage II.  Med list reviewed; no change indicated.

## 2022-10-18 NOTE — Patient Instructions (Signed)
See assessment and plan under each diagnosis in the problem list and acutely for this visit 

## 2022-10-18 NOTE — Assessment & Plan Note (Addendum)
Rhabdo due to a fall with soft tissue musculoskeletal injury in the context of acute UTI and AKI.  Serially the CK improved from peak at admission of 7823 down to 980. His soft tissue and muscular injuries were extensive and raise consideration that he may have had a seizure.  In the context of prolonged QT syndrome; cardiac prodrome.also possible.  Close monitor at SNF.

## 2022-10-20 ENCOUNTER — Ambulatory Visit (INDEPENDENT_AMBULATORY_CARE_PROVIDER_SITE_OTHER): Payer: Medicare Other | Admitting: Urology

## 2022-10-20 ENCOUNTER — Encounter: Payer: Self-pay | Admitting: Urology

## 2022-10-20 ENCOUNTER — Other Ambulatory Visit: Payer: Self-pay | Admitting: *Deleted

## 2022-10-20 VITALS — BP 108/62 | HR 73 | Ht 67.0 in | Wt 170.0 lb

## 2022-10-20 DIAGNOSIS — N390 Urinary tract infection, site not specified: Secondary | ICD-10-CM | POA: Diagnosis not present

## 2022-10-20 DIAGNOSIS — R350 Frequency of micturition: Secondary | ICD-10-CM | POA: Diagnosis not present

## 2022-10-20 DIAGNOSIS — N200 Calculus of kidney: Secondary | ICD-10-CM

## 2022-10-20 DIAGNOSIS — N401 Enlarged prostate with lower urinary tract symptoms: Secondary | ICD-10-CM

## 2022-10-20 DIAGNOSIS — R351 Nocturia: Secondary | ICD-10-CM

## 2022-10-20 DIAGNOSIS — Z87440 Personal history of urinary (tract) infections: Secondary | ICD-10-CM

## 2022-10-20 LAB — URINALYSIS, ROUTINE W REFLEX MICROSCOPIC
Bilirubin, UA: NEGATIVE
Glucose, UA: NEGATIVE
Ketones, UA: NEGATIVE
Nitrite, UA: NEGATIVE
Specific Gravity, UA: 1.015 (ref 1.005–1.030)
Urobilinogen, Ur: 0.2 mg/dL (ref 0.2–1.0)
pH, UA: 6 (ref 5.0–7.5)

## 2022-10-20 LAB — MICROSCOPIC EXAMINATION

## 2022-10-20 NOTE — Patient Outreach (Signed)
Chase Chase admitted to [facility] under 3-day [facility] [place] [facility] waiver. Screening for potential [facility] care coordination services as benefit of insurance plan and PCP.  Update received from [place] worker indicating potential transition plan is ALF post SNF.   Will continue to follow.    Marthenia Rolling, MSN, RN,BSN [place] Acute Care Coordinator [phone] (Direct dial)

## 2022-10-20 NOTE — Progress Notes (Unsigned)
10/20/2022 12:10 PM   Chase Chase 1922/06/13 [id number]  Referring provider: Sharilyn Sites, MD [address] [place],  [place] [address]  Followup nephrolithiasis  HPI: Chase Chase is a 88yo here for followup for nephrolithiasis. He underwent CT stone study which showed left renal pelvis calculi and mild left hydronephrosis. He did have right flank pain which has since resolved. He is currently on uroxatral '10mg'$  which he takes intermittently. Urine stream fair. No straining to urinate. Nocturia 2-3x.    PMH: Past Medical History:  Diagnosis Date   At risk of UTI    CHB (complete heart block) ([place]) 09/2019   CHB (complete heart block) ([place]) 09/2019   Glaucoma    Glaucoma    HOH (hard of hearing)    Hypercholesterolemia    Pacemaker 09/19/2019   Biotronik dual chamber PPM   Prostate disease    unspecified   Vertigo     Surgical History: Past Surgical History:  Procedure Laterality Date   CHOLECYSTECTOMY     HERNIA REPAIR     bilateral   PACEMAKER IMPLANT N/A 09/19/2019   Procedure: PACEMAKER IMPLANT;  Surgeon: Evans Lance, MD;  Location: [facility];  Service: Cardiovascular;  Laterality: N/A;    Home Medications:  Allergies as of 10/20/2022   No Known Allergies      Medication List        Accurate as of October 20, 2022 12:10 PM. If you have any questions, ask your nurse or doctor.          alfuzosin 10 MG 24 hr tablet Commonly known as: UROXATRAL Take 1 tablet (10 mg total) by mouth at bedtime.   Fish Oil 645 MG Caps Take 1,290 mg by mouth daily. Take 2 capsules daily for cholesterol.   latanoprost 0.005 % ophthalmic solution Commonly known as: XALATAN Place 1 drop into both eyes at bedtime as needed.   levothyroxine 50 MCG tablet Commonly known as: SYNTHROID Take 50 mcg by mouth daily.   lidocaine 2 % solution Commonly known as: XYLOCAINE   meclizine 25 MG tablet Commonly known as: ANTIVERT Take 25 mg by mouth as needed  for dizziness (rarely needs).   PROSTATE HEALTH PO Take 2 tablets by mouth daily.   Vitamin D3 125 MCG (5000 UT) Caps Take 5,000 Units by mouth See admin instructions. Take 1 capsule by mouth for 10 days every month   zinc gluconate 50 MG tablet Take 50 mg by mouth daily.        Allergies: No Known Allergies  Family History: Family History  Problem Relation Age of Onset   Breast cancer Sister    Lung cancer Brother    Lung cancer Brother    Lung cancer Brother    Breast cancer Sister    Breast cancer Sister     Social History:  reports that he has quit smoking. His smoking use included cigarettes. He has a 16.00 pack-year smoking history. He quit smokeless tobacco use about 11 years ago.  His smokeless tobacco use included chew. He reports that he does not drink alcohol and does not use drugs.  ROS: All other review of systems were reviewed and are negative except what is noted above in HPI  Physical Exam: BP 108/62   Pulse 73   Ht '5\' 7"'$  (1.702 m)   Wt 170 lb (77.1 kg)   BMI 26.63 kg/m   Constitutional:  Alert and oriented, No acute distress. HEENT: [place] AT, moist mucus  membranes.  Trachea midline, no masses. Cardiovascular: No clubbing, cyanosis, or edema. Respiratory: Normal respiratory effort, no increased work of breathing. GI: Abdomen is soft, nontender, nondistended, no abdominal masses GU: No CVA tenderness.  Lymph: No cervical or inguinal lymphadenopathy. Skin: No rashes, bruises or suspicious lesions. Neurologic: Grossly intact, no focal deficits, moving all 4 extremities. Psychiatric: Normal mood and affect.  Laboratory Data: Lab Results  Component Value Date   WBC 7.8 10/12/2022   HGB 13.1 10/12/2022   HCT 38.4 (L) 10/12/2022   MCV 101.3 (H) 10/12/2022   PLT 148 (L) 10/12/2022    Lab Results  Component Value Date   CREATININE 1.08 10/12/2022    No results found for: "PSA"  No results found for: "TESTOSTERONE"  No results found for:  "HGBA1C"  Urinalysis    Component Value Date/Time   COLORURINE AMBER (A) 10/09/2022 1655   APPEARANCEUR HAZY (A) 10/09/2022 1655   APPEARANCEUR Cloudy (A) 10/06/2022 1214   LABSPEC 1.019 10/09/2022 1655   PHURINE 5.0 10/09/2022 1655   GLUCOSEU 50 (A) 10/09/2022 1655   HGBUR LARGE (A) 10/09/2022 1655   BILIRUBINUR NEGATIVE 10/09/2022 1655   BILIRUBINUR Negative 10/06/2022 1214   KETONESUR 5 (A) 10/09/2022 1655   PROTEINUR 100 (A) 10/09/2022 1655   UROBILINOGEN 0.2 04/08/2013 1815   NITRITE NEGATIVE 10/09/2022 1655   LEUKOCYTESUR LARGE (A) 10/09/2022 1655    Lab Results  Component Value Date   LABMICR See below: 10/06/2022   WBCUA >30 (A) 10/06/2022   LABEPIT 0-10 10/06/2022   MUCUS Present (A) 10/06/2022   BACTERIA NONE SEEN 10/09/2022    Pertinent Imaging: CT stone study 10/10/2022: Images reviewed and discussed with the patient  Results for orders placed in visit on 09/05/01  DG Abd 1 View  Narrative FINDINGS CLINICAL DATA:  RIGHT RENAL CALCULUS. SINGLE VIEW ABDOMEN: BILATERAL INTRARENAL CALCULI ARE SEEN.  THE LARGEST CALCULUS IN THE REGION OF THE MID POLE OF THE LEFT KIDNEY MEASURES 12 X 8 MM.  PELVIC VASCULAR CALCIFICATION IS ALSO NOTED BUT NO DEFINITE URETERAL CALCULI ARE SEEN.  SURGICAL CLIPS ARE SEEN IN THE UPPER RIGHT ABDOMEN. IMPRESSION BILATERAL INTRARENAL CALCULI, LARGEST IN THE LEFT KIDNEY MEASURING 12 MM.  Results for orders placed during the hospital encounter of 09/06/21  US Venous Img Lower Bilateral  Narrative CLINICAL DATA:  Bilateral lower extremity weakness and swelling.  EXAM: BILATERAL LOWER EXTREMITY VENOUS DOPPLER ULTRASOUND  TECHNIQUE: Gray-scale sonography with graded compression, as well as color Doppler and duplex ultrasound were performed to evaluate the lower extremity deep venous systems from the level of the common femoral vein and including the common femoral, femoral, profunda femoral, popliteal and calf veins including the  posterior tibial, peroneal and gastrocnemius veins when visible. The superficial great saphenous vein was also interrogated. Spectral Doppler was utilized to evaluate flow at rest and with distal augmentation maneuvers in the common femoral, femoral and popliteal veins.  COMPARISON:  None.  FINDINGS: RIGHT LOWER EXTREMITY  Common Femoral Vein: No evidence of thrombus. Normal compressibility, respiratory phasicity and response to augmentation.  Saphenofemoral Junction: No evidence of thrombus. Normal compressibility and flow on color Doppler imaging.  Profunda Femoral Vein: No evidence of thrombus. Normal compressibility and flow on color Doppler imaging.  Femoral Vein: No evidence of thrombus. Normal compressibility, respiratory phasicity and response to augmentation.  Popliteal Vein: No evidence of thrombus. Normal compressibility, respiratory phasicity and response to augmentation.  Calf Veins: Visualized right deep calf veins are patent without thrombus.  Superficial Great Saphenous  Vein: No evidence of thrombus. Normal compressibility.  LEFT LOWER EXTREMITY  Common Femoral Vein: No evidence of thrombus. Normal compressibility, respiratory phasicity and response to augmentation.  Saphenofemoral Junction: No evidence of thrombus. Normal compressibility and flow on color Doppler imaging.  Profunda Femoral Vein: No evidence of thrombus. Normal compressibility and flow on color Doppler imaging.  Femoral Vein: No evidence of thrombus. Normal compressibility, respiratory phasicity and response to augmentation.  Popliteal Vein: No evidence of thrombus. Normal compressibility, respiratory phasicity and response to augmentation.  Calf Veins: Visualized left deep calf veins are patent without thrombus.  Superficial Great Saphenous Vein: No evidence of thrombus. Normal compressibility.  Other Findings:  Bilateral ankle edema.  IMPRESSION: No evidence of deep venous  thrombosis in either lower extremity.   Electronically Signed By: Markus Daft M.D. On: 09/06/2021 17:39  No results found for this or any previous visit.  No results found for this or any previous visit.  No results found for this or any previous visit.  No valid procedures specified. Results for orders placed during the hospital encounter of 04/03/22  CT HEMATURIA WORKUP  Narrative CLINICAL DATA:  Hematuria, recurrent UTI * Tracking Code: BO *  EXAM: CT ABDOMEN AND PELVIS WITHOUT AND WITH CONTRAST  TECHNIQUE: Multidetector CT imaging of the abdomen and pelvis was performed following the standard protocol before and following the bolus administration of intravenous contrast.  RADIATION DOSE REDUCTION: This exam was performed according to the departmental dose-optimization program which includes automated exposure control, adjustment of the mA and/or kV according to patient size and/or use of iterative reconstruction technique.  CONTRAST:  143m OMNIPAQUE IOHEXOL 300 MG/ML  SOLN  COMPARISON:  None.  FINDINGS: Lower chest: Small, likely loculated left pleural effusion and associated atelectasis or consolidation. Coronary artery calcifications. Multiple soft tissue nodules and or enlarged lymph nodes in the partially imaged lower mediastinum, largest periaortic lesion measuring 5.9 x 4.1 cm (series 7, image 15).  Hepatobiliary: No focal liver abnormality is seen. Status post cholecystectomy. Postoperative biliary dilatation.  Pancreas: Unremarkable. No pancreatic ductal dilatation or surrounding inflammatory changes.  Spleen: Normal in size without significant abnormality.  Adrenals/Urinary Tract: Adrenal glands are unremarkable. Multiple bilateral renal calculi, including 1.6 and 1.3 cm calculi within the left renal pelvis, with mild associated left hydronephrosis. Multiple additional small bilateral nonobstructive calculi. Multiple simple, benign bilateral renal  cysts, and a small hemorrhagic or proteinaceous cyst of the lateral midportion of the right kidney (series 5, image 59). Multiple small bladder diverticula.  Stomach/Bowel: Stomach is within normal limits. Appendix appears normal. No evidence of bowel wall thickening, distention, or inflammatory changes. Sigmoid diverticula.  Vascular/Lymphatic: Aortic atherosclerosis. No enlarged abdominal or pelvic lymph nodes.  Reproductive: Prostatomegaly with median lobe hypertrophy.  Other: Fat containing right inguinal hernia.  No ascites.  Musculoskeletal: No acute or significant osseous findings.  IMPRESSION: 1. Multiple bilateral renal calculi, including 1.6 and 1.3 cm calculi within the left renal pelvis, with mild associated left hydronephrosis. Multiple additional small bilateral nonobstructive calculi. 2. Prostatomegaly with median lobe hypertrophy. 3. Multiple soft tissue nodules and or enlarged lymph nodes in the partially imaged lower mediastinum, largest periaortic lesion measuring 5.9 x 4.1 cm. Findings are highly concerning for malignancy. Recommend dedicated contrast enhanced CT of the chest. 4. Small, likely loculated left pleural effusion and associated atelectasis or consolidation. 5. Coronary artery disease.  Aortic Atherosclerosis (ICD10-I70.0).   Electronically Signed By: ADelanna AhmadiM.D. On: 04/03/2022 10:21  Results for orders placed during the  hospital encounter of 10/09/22  CT RENAL STONE STUDY  Addendum 10/10/2022  4:40 PM ADDENDUM REPORT: 10/10/2022 16:38  ADDENDUM: These results were called by telephone at the time of interpretation on 10/10/2022 at 4:37 pm to provider [facility] , who verbally acknowledged these results.   Electronically Signed By: Ronney Asters M.D. On: 10/10/2022 16:38  Narrative CLINICAL DATA:  Nephrolithiasis.  EXAM: CT ABDOMEN AND PELVIS WITHOUT CONTRAST  TECHNIQUE: Multidetector CT imaging of the abdomen and  pelvis was performed following the standard protocol without IV contrast.  RADIATION DOSE REDUCTION: This exam was performed according to the departmental dose-optimization program which includes automated exposure control, adjustment of the mA and/or kV according to patient size and/or use of iterative reconstruction technique.  COMPARISON:  CT abdomen and pelvis 05/25/2022  FINDINGS: Lower chest: Small left pleural effusion has decreased. There is a trace right pleural effusion. There is some atelectasis in both lung bases. Para-aortic heterogeneous density surrounding the entire aorta has increased in size now measuring 7.3 x 10.2 cm at the diaphragm (previously 5.1 x 4.4 cm).  Hepatobiliary: No focal liver abnormality is seen. Status post cholecystectomy. No biliary dilatation.  Pancreas: Unremarkable. No pancreatic ductal dilatation or surrounding inflammatory changes.  Spleen: Normal in size without focal abnormality.  Adrenals/Urinary Tract: Mild left hydronephrosis persists. There are 2 calculi within the left renal pelvis measuring up to 1 cm in the ureteropelvic junction, unchanged. Nonobstructing left renal calculi in the inferior pole measuring up to 4 mm are unchanged.  Nonobstructing right renal calculi are present measuring up to 15 mm. Rounded hyperdense lesion in the right kidney measures 2 cm and appears unchanged, most likely proteinaceous cyst.  The adrenal glands are within normal limits.  Bladder diverticula are again noted.  No bladder calcifications.  Stomach/Bowel: Stomach is within normal limits. No evidence of bowel wall thickening, distention, or inflammatory changes. The appendix is not seen. There is colonic diverticulosis.  Vascular/Lymphatic: There are atherosclerotic calcifications of the aorta. Aorta and IVC are normal in size. No enlarged lymph nodes.  Reproductive: Prostate gland is enlarged, unchanged.  Other: No ascites. Small fat  containing right inguinal hernia. Injection sites are seen in the subcutaneous tissues of the anterior abdominal wall.  Musculoskeletal: Degenerative changes affect the spine  IMPRESSION: 1. Enlarging periaortic heterogeneous density surrounding the entire aorta concerning for hematoma. This can be further evaluated with CTA of the chest. 2. Stable mild left hydronephrosis secondary to 2 calculi in the left renal pelvis measuring up to 1 cm. 3. Bilateral nonobstructing renal calculi are unchanged. 4. Colonic diverticulosis. 5. Stable bladder diverticula. 6. Small left and trace right pleural effusions. 7. Prostatomegaly, unchanged. 8. Subcentimeter right Bosniak II renal cyst, too small to characterize. No follow-up imaging is recommended. JACR 2018 Feb; 264-273, Management of the Incidental Renal Mass on CT, [employer] 2021; 814-848, Bosniak Classification of Cystic Renal Masses, Version 2019.  Aortic Atherosclerosis (ICD10-I70.0).  Electronically Signed: By: Ronney Asters M.D. On: 10/10/2022 16:29   Assessment & Plan:    1. Nephrolithiasis -We discussed the management of kidney stones. These options include observation, ureteroscopy, shockwave lithotripsy (ESWL) and percutaneous nephrolithotomy (PCNL). We discussed which options are relevant to the patient's stone(s). We discussed the natural history of kidney stones as well as the complications of untreated stones and the impact on quality of life without treatment as well as with each of the above listed treatments. We also discussed the efficacy of each treatment in its ability to clear  the stone burden. With any of these management options I discussed the signs and symptoms of infection and the need for emergent treatment should these be experienced. For each option we discussed the ability of each procedure to clear the patient of their stone burden.   For observation I described the risks which include but are not  limited to silent renal damage, life-threatening infection, need for emergent surgery, failure to pass stone and pain.   For ureteroscopy I described the risks which include bleeding, infection, damage to contiguous structures, positioning injury, ureteral stricture, ureteral avulsion, ureteral injury, need for prolonged ureteral stent, inability to perform ureteroscopy, need for an interval procedure, inability to clear stone burden, stent discomfort/pain, heart attack, stroke, pulmonary embolus and the inherent risks with general anesthesia.   For shockwave lithotripsy I described the risks which include arrhythmia, kidney contusion, kidney hemorrhage, need for transfusion, pain, inability to adequately break up stone, inability to pass stone fragments, Steinstrasse, infection associated with obstructing stones, need for alternate surgical procedure, need for repeat shockwave lithotripsy, MI, CVA, PE and the inherent risks with anesthesia/conscious sedation.   For PCNL I described the risks including positioning injury, pneumothorax, hydrothorax, need for chest tube, inability to clear stone burden, renal laceration, arterial venous fistula or malformation, need for embolization of kidney, loss of kidney or renal function, need for repeat procedure, need for prolonged nephrostomy tube, ureteral avulsion, MI, CVA, PE and the inherent risks of general anesthesia.   - The patient would like to proceed with observation  2. Benign prostatic hyperplasia with urinary frequency -Continue uroxatral '10mg'$  qhs  3. Nocturia Continue uroxatral '10mg'$  qhs  4. Frequent UTI Urine for culture   No follow-ups on file.  Nicolette Bang, MD  [facility] [place]

## 2022-10-20 NOTE — Patient Instructions (Signed)

## 2022-10-23 ENCOUNTER — Encounter: Payer: Self-pay | Admitting: Adult Health

## 2022-10-23 ENCOUNTER — Non-Acute Institutional Stay (SKILLED_NURSING_FACILITY): Payer: Medicare Other | Admitting: Adult Health

## 2022-10-23 DIAGNOSIS — N400 Enlarged prostate without lower urinary tract symptoms: Secondary | ICD-10-CM | POA: Diagnosis not present

## 2022-10-23 DIAGNOSIS — I959 Hypotension, unspecified: Secondary | ICD-10-CM

## 2022-10-23 NOTE — Progress Notes (Unsigned)
Location:  [place] Room Number: 125 Place of Service:  SNF (31)   CODE STATUS: dnr   No Known Allergies  Chief Complaint  Patient presents with   Acute Visit    Low blood pressure     HPI:    Past Medical History:  Diagnosis Date   At risk of UTI    CHB (complete heart block) ([place]) 09/2019   CHB (complete heart block) ([place]) 09/2019   Glaucoma    Glaucoma    HOH (hard of hearing)    Hypercholesterolemia    Pacemaker 09/19/2019   Biotronik dual chamber PPM   Prostate disease    unspecified   Vertigo     Past Surgical History:  Procedure Laterality Date   CHOLECYSTECTOMY     HERNIA REPAIR     bilateral   PACEMAKER IMPLANT N/A 09/19/2019   Procedure: PACEMAKER IMPLANT;  Surgeon: [place] Lance, MD;  Location: [place] [facility];  Service: Cardiovascular;  Laterality: N/A;    Social History   Socioeconomic History   Marital status: Widowed    Spouse name: Not on file   Number of children: Not on file   Years of education: Not on file   Highest education level: Not on file  Occupational History   Occupation: retired  Tobacco Use   Smoking status: Former    Packs/day: 2.00    Years: 8.00    Total pack years: 16.00    Types: Cigarettes   Smokeless tobacco: Former    Types: Chew    Quit date: 10/12/2011   Tobacco comments:    Chewing and snuff  Vaping Use   Vaping Use: Never used  Substance and Sexual Activity   Alcohol use: No   Drug use: No   Sexual activity: Not on file  Other Topics Concern   Not on file  Social History Narrative   Not on file   Social Determinants of Health   Financial Resource Strain: Not on file  Food Insecurity: No Food Insecurity (10/09/2022)   Hunger Vital Sign    Worried About Running Out of Food in the Last Year: Never true    Ran Out of Food in the Last Year: Never true  Transportation Needs: No Transportation Needs (10/09/2022)   PRAPARE - Hydrologist  (Medical): No    Lack of Transportation (Non-Medical): No  Physical Activity: Not on file  Stress: Not on file  Social Connections: Not on file  Intimate Partner Violence: Not At Risk (10/09/2022)   Humiliation, Afraid, Rape, and Kick questionnaire    Fear of Current or Ex-Partner: No    Emotionally Abused: No    Physically Abused: No    Sexually Abused: No   Family History  Problem Relation Age of Onset   Breast cancer Sister    Lung cancer Brother    Lung cancer Brother    Lung cancer Brother    Breast cancer Sister    Breast cancer Sister       VITAL SIGNS BP 100/60   Pulse 63   Temp 97.7 F (36.5 C)   Resp 16   Ht '5\' 7"'$  (1.702 m)   Wt 167 lb (75.8 kg)   SpO2 97%   BMI 26.16 kg/m   Outpatient Encounter Medications as of 10/23/2022  Medication Sig   alfuzosin (UROXATRAL) 10 MG 24 hr tablet Take 1 tablet (10 mg total) by mouth at bedtime.  Cholecalciferol (VITAMIN D3) 125 MCG (5000 UT) CAPS Take 5,000 Units by mouth See admin instructions. Take 1 capsule by mouth for 10 days every month   latanoprost (XALATAN) 0.005 % ophthalmic solution Place 1 drop into both eyes at bedtime as needed.    levothyroxine (SYNTHROID) 50 MCG tablet Take 50 mcg by mouth daily.   lidocaine (XYLOCAINE) 2 % solution    meclizine (ANTIVERT) 25 MG tablet Take 25 mg by mouth as needed for dizziness (rarely needs).   Misc Natural Products (PROSTATE HEALTH PO) Take 2 tablets by mouth daily.   Omega-3 Fatty Acids (FISH OIL) 645 MG CAPS Take 1,290 mg by mouth daily. Take 2 capsules daily for cholesterol.   zinc gluconate 50 MG tablet Take 50 mg by mouth daily.   No facility-administered encounter medications on file as of 10/23/2022.     SIGNIFICANT DIAGNOSTIC EXAMS  PREVIOUS   10-09-22: ct of head:  No acute intracranial findings are seen. Atrophy. 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 No significant interval changes are noted. There is subcutaneous contusion/hematoma in the  frontal scalp. No fracture is seen in calvarium.  NO NEW EXAMS.   LABS REVIEWED PREVIOUS   10-09-22: wbc 23.1; hgb 15.3; hct 44.2; mcv 99.1 plt 165; glucose 150; bun 45; creat 1.61; k+ 4.3; na++ 139; ca 10.2;gfr 38; protein 7.0 albumin 3.6 urine culture: <10,000. CK 7823 10-12-22: wbc 7.8; hgb 13.1; hct 38.4; mcv 101.3 plt 148; glucose 99; bun 31; creat 1.08; k+ 4.0; na++ 139; ca 9.0; gfr >60; protein 5.0; albumin 2.5; ast 114; alt 57  NO NEW LABS.   Review of Systems  Constitutional:  Negative for malaise/fatigue.  Respiratory:  Negative for cough and shortness of breath.   Cardiovascular:  Negative for chest pain, palpitations and leg swelling.  Gastrointestinal:  Negative for abdominal pain, constipation and heartburn.  Musculoskeletal:  Negative for back pain, joint pain and myalgias.  Skin: Negative.   Neurological:  Negative for dizziness.  Psychiatric/Behavioral:  The patient is not nervous/anxious.     Physical Exam Constitutional:      General: He is not in acute distress.    Appearance: He is well-developed. He is not diaphoretic.  Neck:     Thyroid: No thyromegaly.  Cardiovascular:     Rate and Rhythm: Normal rate and regular rhythm.     Pulses: Normal pulses.     Heart sounds: Normal heart sounds.  Pulmonary:     Effort: Pulmonary effort is normal. No respiratory distress.     Breath sounds: Normal breath sounds.  Abdominal:     General: Bowel sounds are normal. There is no distension.     Palpations: Abdomen is soft.     Tenderness: There is no abdominal tenderness.  Musculoskeletal:        General: Normal range of motion.     Cervical back: Neck supple.     Right lower leg: No edema.     Left lower leg: No edema.     Comments:  Kyphosis    Lymphadenopathy:     Cervical: No cervical adenopathy.  Skin:    General: Skin is warm and dry.  Neurological:     Mental Status: He is alert. Mental status is at baseline.  Psychiatric:        Mood and Affect: Mood  normal.       ASSESSMENT/ PLAN:  TODAY  Benign prostatic hyperplasia unspecified whether lower urinary tract symptoms present Hypotension; unspecified hypotension type  Will  stop alfuzosin at this time and will monitor    Ok Edwards NP [facility]  call [phone]

## 2022-10-24 DIAGNOSIS — I959 Hypotension, unspecified: Secondary | ICD-10-CM | POA: Insufficient documentation

## 2022-10-27 ENCOUNTER — Encounter: Payer: Self-pay | Admitting: Adult Health

## 2022-10-27 ENCOUNTER — Other Ambulatory Visit: Payer: Self-pay | Admitting: *Deleted

## 2022-10-27 ENCOUNTER — Non-Acute Institutional Stay (SKILLED_NURSING_FACILITY): Payer: Medicare Other | Admitting: Adult Health

## 2022-10-27 DIAGNOSIS — T796XXD Traumatic ischemia of muscle, subsequent encounter: Secondary | ICD-10-CM | POA: Diagnosis not present

## 2022-10-27 DIAGNOSIS — I700 Atherosclerosis of aorta: Secondary | ICD-10-CM | POA: Diagnosis not present

## 2022-10-27 DIAGNOSIS — E039 Hypothyroidism, unspecified: Secondary | ICD-10-CM | POA: Diagnosis not present

## 2022-10-27 NOTE — Progress Notes (Signed)
Location:  [facility] Room Number: 125 Place of Service:  SNF (31)   CODE STATUS: dnr   No Known Allergies  Chief Complaint  Patient presents with   Acute Visit    Care plan meeting.     HPI:  We have come together for his care plan meeting. Family present. BIMS 12/15 mood: 2/30: decreased energy.  No falls since admission. He is out of bed with supervision. He requires moderate assist with his adls. He is incontinent of bladder and frequently incontinent of bowel. Dietary: regular diet feeds self; weight is 167 pounds. Therapy: contact guard for ambulation 100 feet; transfers. brp upper and lower body with contact guard car transfer at supervision; own exercises as well. He will continue to be followed for his chronic illnesses including: Aortic atherosclerosis Acquired hypothyroidism  Traumatic rhabdomyolysis subsequent encounter   Past Medical History:  Diagnosis Date   At risk of UTI    CHB (complete heart block) ([place]) 09/2019   CHB (complete heart block) ([place]) 09/2019   Glaucoma    Glaucoma    HOH (hard of hearing)    Hypercholesterolemia    Pacemaker 09/19/2019   Biotronik dual chamber PPM   Prostate disease    unspecified   Vertigo     Past Surgical History:  Procedure Laterality Date   CHOLECYSTECTOMY     HERNIA REPAIR     bilateral   PACEMAKER IMPLANT N/A 09/19/2019   Procedure: PACEMAKER IMPLANT;  Surgeon: Evans Lance, MD;  Location: [facility];  Service: Cardiovascular;  Laterality: N/A;    Social History   Socioeconomic History   Marital status: Widowed    Spouse name: Not on file   Number of children: Not on file   Years of education: Not on file   Highest education level: Not on file  Occupational History   Occupation: retired  Tobacco Use   Smoking status: Former    Packs/day: 2.00    Years: 8.00    Total pack years: 16.00    Types: Cigarettes   Smokeless tobacco: Former    Types: Chew    Quit date:  10/12/2011   Tobacco comments:    Chewing and snuff  Vaping Use   Vaping Use: Never used  Substance and Sexual Activity   Alcohol use: No   Drug use: No   Sexual activity: Not on file  Other Topics Concern   Not on file  Social History Narrative   Not on file   Social Determinants of Health   Financial Resource Strain: Not on file  Food Insecurity: No Food Insecurity (10/09/2022)   Hunger Vital Sign    Worried About Running Out of Food in the Last Year: Never true    Ran Out of Food in the Last Year: Never true  Transportation Needs: No Transportation Needs (10/09/2022)   PRAPARE - Hydrologist (Medical): No    Lack of Transportation (Non-Medical): No  Physical Activity: Not on file  Stress: Not on file  Social Connections: Not on file  Intimate Partner Violence: Not At Risk (10/09/2022)   Humiliation, Afraid, Rape, and Kick questionnaire    Fear of Current or Ex-Partner: No    Emotionally Abused: No    Physically Abused: No    Sexually Abused: No   Family History  Problem Relation Age of Onset   Breast cancer Sister    Lung cancer Brother    Lung  cancer Brother    Lung cancer Brother    Breast cancer Sister    Breast cancer Sister       VITAL SIGNS BP 105/63   Pulse 64   Temp 97.7 F (36.5 C)   Resp 16   Ht '5\' 7"'$  (1.702 m)   Wt 167 lb (75.8 kg)   SpO2 97%   BMI 26.16 kg/m   Outpatient Encounter Medications as of 10/27/2022  Medication Sig   Cholecalciferol (VITAMIN D3) 125 MCG (5000 UT) CAPS Take 5,000 Units by mouth See admin instructions. Take 1 capsule by mouth for 10 days every month   latanoprost (XALATAN) 0.005 % ophthalmic solution Place 1 drop into both eyes at bedtime as needed.    levothyroxine (SYNTHROID) 50 MCG tablet Take 50 mcg by mouth daily.   lidocaine (XYLOCAINE) 2 % solution    meclizine (ANTIVERT) 25 MG tablet Take 25 mg by mouth as needed for dizziness (rarely needs).   Misc Natural Products (PROSTATE  HEALTH PO) Take 2 tablets by mouth daily.   Omega-3 Fatty Acids (FISH OIL) 645 MG CAPS Take 1,290 mg by mouth daily. Take 2 capsules daily for cholesterol.   zinc gluconate 50 MG tablet Take 50 mg by mouth daily.   No facility-administered encounter medications on file as of 10/27/2022.     SIGNIFICANT DIAGNOSTIC EXAMS  PREVIOUS   10-09-22: ct of head:  No acute intracranial findings are seen. Atrophy. 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 No significant interval changes are noted. There is subcutaneous contusion/hematoma in the frontal scalp. No fracture is seen in calvarium.  NO NEW EXAMS.   LABS REVIEWED PREVIOUS   10-09-22: wbc 23.1; hgb 15.3; hct 44.2; mcv 99.1 plt 165; glucose 150; bun 45; creat 1.61; k+ 4.3; na++ 139; ca 10.2;gfr 38; protein 7.0 albumin 3.6 urine culture: <10,000. CK 7823 10-12-22: wbc 7.8; hgb 13.1; hct 38.4; mcv 101.3 plt 148; glucose 99; bun 31; creat 1.08; k+ 4.0; na++ 139; ca 9.0; gfr >60; protein 5.0; albumin 2.5; ast 114; alt 57  NO NEW LABS.   Review of Systems  Constitutional:  Negative for malaise/fatigue.  Respiratory:  Negative for cough and shortness of breath.   Cardiovascular:  Negative for chest pain, palpitations and leg swelling.  Gastrointestinal:  Negative for abdominal pain, constipation and heartburn.  Musculoskeletal:  Negative for back pain, joint pain and myalgias.  Skin: Negative.   Neurological:  Negative for dizziness.  Psychiatric/Behavioral:  The patient is not nervous/anxious.    Physical Exam Constitutional:      General: He is not in acute distress.    Appearance: He is well-developed. He is not diaphoretic.  Neck:     Thyroid: No thyromegaly.  Cardiovascular:     Rate and Rhythm: Normal rate and regular rhythm.     Pulses: Normal pulses.     Heart sounds: Normal heart sounds.  Pulmonary:     Effort: Pulmonary effort is normal. No respiratory distress.     Breath sounds: Normal breath sounds.   Abdominal:     General: Bowel sounds are normal. There is no distension.     Palpations: Abdomen is soft.     Tenderness: There is no abdominal tenderness.  Musculoskeletal:        General: Normal range of motion.     Cervical back: Neck supple.  Lymphadenopathy:     Cervical: No cervical adenopathy.  Skin:    General: Skin is warm and dry.  Neurological:  Mental Status: He is alert. Mental status is at baseline.  Psychiatric:        Mood and Affect: Mood normal.        ASSESSMENT/ PLAN:  TODAY  Aortic atherosclerosis Acquired hypothyroidism Traumatic rhabdomyolysis subsequent encounter    Will continue current medications Will continue therapy as directed The goal of his care at this time is assisted living Will continue to monitor his status  Time spent with patient: 40 minutes: discharge goals; medications; therapy.    Ok Edwards NP [facility]   call [phone]

## 2022-10-27 NOTE — Patient Outreach (Signed)
[place] Coordinator follow up. Mr. Monger resides in [facility]. Screening for potential [facility] care coordination needs as benefit of insurance plan and PCP.  Update received from [place], [place] social worker. [facility] is supposed to come do assessment today. Transition plan is for ALF.   Marthenia Rolling, MSN, RN,BSN [place] Acute Care Coordinator [phone] (Direct dial)

## 2022-11-06 ENCOUNTER — Encounter: Payer: Self-pay | Admitting: Adult Health

## 2022-11-06 ENCOUNTER — Non-Acute Institutional Stay (SKILLED_NURSING_FACILITY): Payer: Medicare Other | Admitting: Adult Health

## 2022-11-06 DIAGNOSIS — N179 Acute kidney failure, unspecified: Secondary | ICD-10-CM | POA: Diagnosis not present

## 2022-11-06 DIAGNOSIS — T796XXD Traumatic ischemia of muscle, subsequent encounter: Secondary | ICD-10-CM | POA: Diagnosis not present

## 2022-11-06 DIAGNOSIS — I700 Atherosclerosis of aorta: Secondary | ICD-10-CM

## 2022-11-06 DIAGNOSIS — Z23 Encounter for immunization: Secondary | ICD-10-CM | POA: Diagnosis not present

## 2022-11-06 NOTE — Progress Notes (Unsigned)
Location:  [facility] Room Number: 125 Place of Service:  SNF (31)  Provider: Ok Edwards np   PCP: Sharilyn Sites, MD Patient Care Team: Sharilyn Sites, MD as PCP - General (Family Medicine) Herminio Commons, MD (Inactive) as PCP - Cardiology (Cardiology)  Extended Emergency Contact Information Primary Emergency Contact: Charm Barges [place] Phone: [phone] Mobile Phone: [phone] Relation: Daughter  Code Status: full  Goals of care:  Advanced Directive information    10/13/2022    3:34 PM  Advanced Directives  Does Patient Have a Medical Advance Directive? Yes  Type of Advance Directive Out of facility DNR (pink MOST or yellow form)  Does patient want to make changes to medical advance directive? No - Patient declined     No Known Allergies  Chief Complaint  Patient presents with   Discharge Note    HPI:  88 y.o. male  being discharged to assisted living with home health for pt. He does not require any dme; will need prescriptions written and will need to follow up with his medical provider. He was hospitalized after a fall at home and developed AKI and rhabdomyolysis on 10-09-22 . He was admitted to this facility for short term rehab. He has actively participated in therapy; but is unable to return back home. He will be discharged to assisted living.     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 His smoking use included cigarettes. He has a 16.00 pack-year smoking history. He quit smokeless tobacco use about 11 years ago.  His smokeless tobacco use included chew. He reports that he does not drink alcohol and does not use drugs. Social History   Socioeconomic History   Marital status: Widowed    Spouse name: Not on file   Number of children: Not on file   Years of education: Not on file   Highest education level: Not on file  Occupational History   Occupation: retired  Tobacco Use   Smoking status: Former    Packs/day: 2.00    Years: 8.00    Total pack years: 16.00    Types: Cigarettes   Smokeless tobacco: Former    Types: Chew    Quit date: 10/12/2011   Tobacco comments:    Chewing and snuff  Vaping Use   Vaping Use: Never used  Substance and Sexual Activity   Alcohol use: No   Drug use: No   Sexual activity: Not on file  Other Topics Concern   Not on file  Social History Narrative   Not on file   Social Determinants of Health   Financial Resource Strain: Not on file  Food Insecurity: No Food Insecurity (10/09/2022)   Hunger Vital Sign  Worried About Charity fundraiser in the Last Year: Never true    [place] in the Last Year: Never true  Transportation Needs: No Transportation Needs (10/09/2022)   PRAPARE - Hydrologist (Medical): No    Lack of Transportation (Non-Medical): No  Physical Activity: Not on file  Stress: Not on file  Social Connections: Not on file  Intimate Partner Violence: Not At Risk (10/09/2022)   Humiliation, Afraid, Rape, and Kick questionnaire    Fear of Current or Ex-Partner: No    Emotionally Abused: No    Physically Abused: No    Sexually Abused: No   Functional Status Survey:    No Known Allergies  Pertinent  Health Maintenance Due  Topic Date Due   INFLUENZA VACCINE  Completed     Medications: Outpatient Encounter Medications as of 11/06/2022  Medication Sig   Cholecalciferol (VITAMIN D3) 125 MCG (5000 UT) CAPS Take 5,000 Units by mouth See admin instructions. Take 1 capsule by mouth for 10 days every month   latanoprost (XALATAN) 0.005 % ophthalmic solution Place 1 drop into both eyes at bedtime as needed.    levothyroxine (SYNTHROID) 50 MCG tablet Take 50 mcg by mouth daily.   lidocaine (XYLOCAINE) 2 % solution    meclizine (ANTIVERT) 25 MG tablet Take 25 mg by mouth as needed for dizziness (rarely needs).   Misc Natural Products (PROSTATE HEALTH PO) Take 2 tablets by mouth daily.   Omega-3 Fatty Acids (FISH OIL) 645 MG CAPS Take 1,290 mg by mouth daily. Take 2 capsules daily for cholesterol.   zinc gluconate 50 MG tablet Take 50 mg by mouth daily.   No facility-administered encounter medications on file as of 11/06/2022.     Vitals:   11/06/22 1125  BP: (!) 94/58  Pulse: 72  Resp: 18  Temp: 98.1 F (36.7 C)  SpO2: 94%  Weight: 169 lb (76.7 kg)  Height: '5\' 7"'$  (1.702 m)   Body mass index is 26.47 kg/m.   PREVIOUS   10-09-22: ct of head:  No acute intracranial findings are seen. Atrophy. 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 No significant interval changes are noted. There is subcutaneous contusion/hematoma in the frontal scalp. No fracture is seen in calvarium.  NO NEW EXAMS.   LABS REVIEWED PREVIOUS   10-09-22: wbc 23.1; hgb 15.3; hct 44.2; mcv 99.1 plt 165; glucose 150; bun 45; creat 1.61; k+ 4.3; na++ 139; ca 10.2;gfr 38; protein 7.0 albumin 3.6 urine culture: <10,000. CK 7823 10-12-22: wbc 7.8; hgb 13.1; hct 38.4; mcv 101.3 plt 148; glucose 99; bun 31; creat 1.08; k+ 4.0; na++ 139; ca 9.0; gfr >60; protein 5.0; albumin 2.5; ast 114; alt 57  NO NEW LABS.   Review of Systems  Constitutional:  Negative for malaise/fatigue.  Respiratory:  Negative for cough and shortness of breath.   Cardiovascular:  Negative for chest pain,  palpitations and leg swelling.  Gastrointestinal:  Negative for abdominal pain, constipation and heartburn.  Musculoskeletal:  Negative for back pain, joint pain and myalgias.  Skin: Negative.   Neurological:  Negative for dizziness.  Psychiatric/Behavioral:  The patient is not nervous/anxious.    Physical Exam Constitutional:      General: He is not in acute distress.    Appearance: He is well-developed. He is not diaphoretic.  Neck:     Thyroid: No thyromegaly.  Cardiovascular:     Rate and Rhythm: Normal rate and regular rhythm.  Heart sounds: Normal heart sounds.  Pulmonary:     Effort: Pulmonary effort is normal. No respiratory distress.     Breath sounds: Normal breath sounds.  Abdominal:     General: Bowel sounds are normal. There is no distension.     Palpations: Abdomen is soft.     Tenderness: There is no abdominal tenderness.  Musculoskeletal:        General: Normal range of motion.     Cervical back: Neck supple.  Lymphadenopathy:     Cervical: No cervical adenopathy.  Skin:    General: Skin is warm and dry.  Neurological:     Mental Status: He is alert. Mental status is at baseline.  Psychiatric:        Mood and Affect: Mood normal.       Assessment/Plan:    Patient is being discharged with the following home health services:  pt to evaluate and treat as indicated for gait balance strength   Patient is being discharged with the following durable medical equipment:  none needed   Patient has been advised to f/u with their PCP in 1-2 weeks to for a transitions of care visit.  Social services at their facility was responsible for arranging this appointment.  Pt was provided with adequate prescriptions of noncontrolled medications to reach the scheduled appointment .  For controlled substances, a limited supply was provided as appropriate for the individual patient.  If the pt normally receives these medications from a pain clinic or has a contract with  another physician, these medications should be received from that clinic or physician only).    His medications will be provided by the assisted living.     Ok Edwards NP [facility]  call [phone]

## 2022-11-08 DIAGNOSIS — M6282 Rhabdomyolysis: Secondary | ICD-10-CM | POA: Diagnosis not present

## 2022-11-08 DIAGNOSIS — I959 Hypotension, unspecified: Secondary | ICD-10-CM | POA: Diagnosis not present

## 2022-11-08 DIAGNOSIS — Z9181 History of falling: Secondary | ICD-10-CM | POA: Diagnosis not present

## 2022-11-08 DIAGNOSIS — I700 Atherosclerosis of aorta: Secondary | ICD-10-CM | POA: Diagnosis not present

## 2022-11-08 DIAGNOSIS — W19XXXD Unspecified fall, subsequent encounter: Secondary | ICD-10-CM | POA: Diagnosis not present

## 2022-11-08 DIAGNOSIS — E039 Hypothyroidism, unspecified: Secondary | ICD-10-CM | POA: Diagnosis not present

## 2022-11-08 DIAGNOSIS — Z87891 Personal history of nicotine dependence: Secondary | ICD-10-CM | POA: Diagnosis not present

## 2022-11-08 DIAGNOSIS — I442 Atrioventricular block, complete: Secondary | ICD-10-CM | POA: Diagnosis not present

## 2022-11-09 ENCOUNTER — Other Ambulatory Visit: Payer: Self-pay | Admitting: *Deleted

## 2022-11-09 NOTE — Patient Outreach (Signed)
[place] Coordinator follow up. Verified in [facility] discharged from [facility] on 11/07/22.   Confirmed with SNF social worker, Chase Chase transitioned to [facility].   No identifiable [facility] care coordination needs.   Marthenia Rolling, MSN, RN,BSN [place] Acute Care Coordinator [phone] (Direct dial)

## 2022-11-14 DIAGNOSIS — I959 Hypotension, unspecified: Secondary | ICD-10-CM | POA: Diagnosis not present

## 2022-11-14 DIAGNOSIS — E039 Hypothyroidism, unspecified: Secondary | ICD-10-CM | POA: Diagnosis not present

## 2022-11-14 DIAGNOSIS — I442 Atrioventricular block, complete: Secondary | ICD-10-CM | POA: Diagnosis not present

## 2022-11-14 DIAGNOSIS — W19XXXD Unspecified fall, subsequent encounter: Secondary | ICD-10-CM | POA: Diagnosis not present

## 2022-11-14 DIAGNOSIS — M6282 Rhabdomyolysis: Secondary | ICD-10-CM | POA: Diagnosis not present

## 2022-11-14 DIAGNOSIS — I700 Atherosclerosis of aorta: Secondary | ICD-10-CM | POA: Diagnosis not present

## 2022-11-16 DIAGNOSIS — I959 Hypotension, unspecified: Secondary | ICD-10-CM | POA: Diagnosis not present

## 2022-11-16 DIAGNOSIS — M6282 Rhabdomyolysis: Secondary | ICD-10-CM | POA: Diagnosis not present

## 2022-11-16 DIAGNOSIS — W19XXXD Unspecified fall, subsequent encounter: Secondary | ICD-10-CM | POA: Diagnosis not present

## 2022-11-16 DIAGNOSIS — I442 Atrioventricular block, complete: Secondary | ICD-10-CM | POA: Diagnosis not present

## 2022-11-16 DIAGNOSIS — E039 Hypothyroidism, unspecified: Secondary | ICD-10-CM | POA: Diagnosis not present

## 2022-11-16 DIAGNOSIS — I700 Atherosclerosis of aorta: Secondary | ICD-10-CM | POA: Diagnosis not present

## 2022-11-21 DIAGNOSIS — I959 Hypotension, unspecified: Secondary | ICD-10-CM | POA: Diagnosis not present

## 2022-11-21 DIAGNOSIS — I700 Atherosclerosis of aorta: Secondary | ICD-10-CM | POA: Diagnosis not present

## 2022-11-21 DIAGNOSIS — M6282 Rhabdomyolysis: Secondary | ICD-10-CM | POA: Diagnosis not present

## 2022-11-21 DIAGNOSIS — E039 Hypothyroidism, unspecified: Secondary | ICD-10-CM | POA: Diagnosis not present

## 2022-11-21 DIAGNOSIS — W19XXXD Unspecified fall, subsequent encounter: Secondary | ICD-10-CM | POA: Diagnosis not present

## 2022-11-21 DIAGNOSIS — I442 Atrioventricular block, complete: Secondary | ICD-10-CM | POA: Diagnosis not present

## 2022-11-22 ENCOUNTER — Emergency Department (HOSPITAL_COMMUNITY)
Admission: EM | Admit: 2022-11-22 | Discharge: 2022-11-22 | Disposition: A | Discharge status: Skilled Nursing Facility | Payer: Medicare Other | Attending: Emergency Medicine | Admitting: Emergency Medicine

## 2022-11-22 ENCOUNTER — Other Ambulatory Visit: Payer: Self-pay

## 2022-11-22 ENCOUNTER — Encounter (HOSPITAL_COMMUNITY): Payer: Self-pay | Admitting: Emergency Medicine

## 2022-11-22 ENCOUNTER — Emergency Department (HOSPITAL_COMMUNITY): Payer: Medicare Other

## 2022-11-22 DIAGNOSIS — I700 Atherosclerosis of aorta: Secondary | ICD-10-CM | POA: Diagnosis not present

## 2022-11-22 DIAGNOSIS — R5383 Other fatigue: Secondary | ICD-10-CM | POA: Diagnosis not present

## 2022-11-22 DIAGNOSIS — Z79899 Other long term (current) drug therapy: Secondary | ICD-10-CM | POA: Insufficient documentation

## 2022-11-22 DIAGNOSIS — Z950 Presence of cardiac pacemaker: Secondary | ICD-10-CM | POA: Diagnosis not present

## 2022-11-22 DIAGNOSIS — E039 Hypothyroidism, unspecified: Secondary | ICD-10-CM | POA: Diagnosis not present

## 2022-11-22 DIAGNOSIS — N12 Tubulo-interstitial nephritis, not specified as acute or chronic: Secondary | ICD-10-CM | POA: Diagnosis not present

## 2022-11-22 DIAGNOSIS — R4182 Altered mental status, unspecified: Secondary | ICD-10-CM | POA: Insufficient documentation

## 2022-11-22 DIAGNOSIS — K5730 Diverticulosis of large intestine without perforation or abscess without bleeding: Secondary | ICD-10-CM | POA: Diagnosis not present

## 2022-11-22 DIAGNOSIS — R400 Somnolence: Secondary | ICD-10-CM | POA: Diagnosis not present

## 2022-11-22 DIAGNOSIS — R590 Localized enlarged lymph nodes: Secondary | ICD-10-CM | POA: Diagnosis not present

## 2022-11-22 DIAGNOSIS — N1330 Unspecified hydronephrosis: Secondary | ICD-10-CM | POA: Diagnosis not present

## 2022-11-22 DIAGNOSIS — J90 Pleural effusion, not elsewhere classified: Secondary | ICD-10-CM | POA: Diagnosis not present

## 2022-11-22 DIAGNOSIS — N179 Acute kidney failure, unspecified: Secondary | ICD-10-CM | POA: Insufficient documentation

## 2022-11-22 DIAGNOSIS — J9859 Other diseases of mediastinum, not elsewhere classified: Secondary | ICD-10-CM | POA: Diagnosis not present

## 2022-11-22 DIAGNOSIS — R9431 Abnormal electrocardiogram [ECG] [EKG]: Secondary | ICD-10-CM | POA: Diagnosis not present

## 2022-11-22 DIAGNOSIS — N281 Cyst of kidney, acquired: Secondary | ICD-10-CM | POA: Diagnosis not present

## 2022-11-22 DIAGNOSIS — R109 Unspecified abdominal pain: Secondary | ICD-10-CM | POA: Diagnosis not present

## 2022-11-22 DIAGNOSIS — R531 Weakness: Secondary | ICD-10-CM | POA: Diagnosis not present

## 2022-11-22 DIAGNOSIS — N200 Calculus of kidney: Secondary | ICD-10-CM | POA: Diagnosis not present

## 2022-11-22 LAB — CBC WITH DIFFERENTIAL/PLATELET
Abs Immature Granulocytes: 0.04 10*3/uL (ref 0.00–0.07)
Basophils Absolute: 0.1 10*3/uL (ref 0.0–0.1)
Basophils Relative: 1 %
Eosinophils Absolute: 0.2 10*3/uL (ref 0.0–0.5)
Eosinophils Relative: 2 %
HCT: 42 % (ref 39.0–52.0)
Hemoglobin: 13.6 g/dL (ref 13.0–17.0)
Immature Granulocytes: 1 %
Lymphocytes Relative: 18 %
Lymphs Abs: 1.3 10*3/uL (ref 0.7–4.0)
MCH: 33.7 pg (ref 26.0–34.0)
MCHC: 32.4 g/dL (ref 30.0–36.0)
MCV: 104 fL — ABNORMAL HIGH (ref 80.0–100.0)
Monocytes Absolute: 1 10*3/uL (ref 0.1–1.0)
Monocytes Relative: 13 %
Neutro Abs: 4.9 10*3/uL (ref 1.7–7.7)
Neutrophils Relative %: 65 %
Platelets: 197 10*3/uL (ref 150–400)
RBC: 4.04 MIL/uL — ABNORMAL LOW (ref 4.22–5.81)
RDW: 12.3 % (ref 11.5–15.5)
WBC: 7.5 10*3/uL (ref 4.0–10.5)
nRBC: 0 % (ref 0.0–0.2)

## 2022-11-22 LAB — URINALYSIS, ROUTINE W REFLEX MICROSCOPIC
Bacteria, UA: NONE SEEN
Bilirubin Urine: NEGATIVE
Glucose, UA: NEGATIVE mg/dL
Hgb urine dipstick: NEGATIVE
Ketones, ur: NEGATIVE mg/dL
Nitrite: NEGATIVE
Protein, ur: 30 mg/dL — AB
Specific Gravity, Urine: 1.011 (ref 1.005–1.030)
WBC, UA: >50 WBC/hpf — ABNORMAL HIGH (ref 0–5)
pH: 6 (ref 5.0–8.0)

## 2022-11-22 LAB — TSH: TSH: 1.914 u[IU]/mL (ref 0.350–4.500)

## 2022-11-22 LAB — BLOOD GAS, VENOUS
Acid-Base Excess: 12.8 mmol/L — ABNORMAL HIGH (ref 0.0–2.0)
Bicarbonate: 39.1 mmol/L — ABNORMAL HIGH (ref 20.0–28.0)
Drawn by: 7012
O2 Saturation: 45.9 %
Patient temperature: 36.7
pCO2, Ven: 54 mmHg (ref 44–60)
pH, Ven: 7.46 — ABNORMAL HIGH (ref 7.25–7.43)
pO2, Ven: <31 mmHg — CL (ref 32–45)

## 2022-11-22 LAB — COMPREHENSIVE METABOLIC PANEL
ALT: 13 U/L (ref 0–44)
AST: 20 U/L (ref 15–41)
Albumin: 3.1 g/dL — ABNORMAL LOW (ref 3.5–5.0)
Alkaline Phosphatase: 91 U/L (ref 38–126)
Anion gap: 9 (ref 5–15)
BUN: 39 mg/dL — ABNORMAL HIGH (ref 8–23)
CO2: 32 mmol/L (ref 22–32)
Calcium: 12.3 mg/dL — ABNORMAL HIGH (ref 8.9–10.3)
Chloride: 98 mmol/L (ref 98–111)
Creatinine, Ser: 1.28 mg/dL — ABNORMAL HIGH (ref 0.61–1.24)
GFR, Estimated: 51 mL/min — ABNORMAL LOW (ref >60)
Glucose, Bld: 126 mg/dL — ABNORMAL HIGH (ref 70–99)
Potassium: 3.9 mmol/L (ref 3.5–5.1)
Sodium: 139 mmol/L (ref 135–145)
Total Bilirubin: 0.5 mg/dL (ref 0.3–1.2)
Total Protein: 6.1 g/dL — ABNORMAL LOW (ref 6.5–8.1)

## 2022-11-22 LAB — T4, FREE: Free T4: 1.18 ng/dL — ABNORMAL HIGH (ref 0.61–1.12)

## 2022-11-22 LAB — CK: Total CK: 20 U/L — ABNORMAL LOW (ref 49–397)

## 2022-11-22 LAB — TROPONIN I (HIGH SENSITIVITY): Troponin I (High Sensitivity): 14 ng/L (ref <18)

## 2022-11-22 LAB — MAGNESIUM: Magnesium: 1.9 mg/dL (ref 1.7–2.4)

## 2022-11-22 MED ORDER — SODIUM CHLORIDE 0.9 % IV SOLN
1.0000 g | Freq: Once | INTRAVENOUS | Status: AC
  Administered 2022-11-22: 1 g via INTRAVENOUS
  Filled 2022-11-22: qty 10

## 2022-11-22 MED ORDER — SODIUM CHLORIDE 0.9 % IV BOLUS (SEPSIS)
500.0000 mL | Freq: Once | INTRAVENOUS | Status: AC
  Administered 2022-11-22: 500 mL via INTRAVENOUS

## 2022-11-22 MED ORDER — ONDANSETRON 4 MG PO TBDP: 4.0000 mg | ORAL_TABLET | Freq: Three times a day (TID) | ORAL | 0 refills | Status: AC | PRN

## 2022-11-22 MED ORDER — CEFDINIR 300 MG PO CAPS: 300.0000 mg | ORAL_CAPSULE | Freq: Two times a day (BID) | ORAL | 0 refills | Status: AC

## 2022-11-22 NOTE — Discharge Instructions (Signed)
You were seen for fatigue in the ER and just generally not acting the same as usual.  You do have evidence of kidney infection, take the antibiotics as prescribed and finish the entire course.  We have given you Zofran to take as needed for nausea or vomiting.  Make sure to drink fluids as you are mildly dehydrated today in the ER.  You did receive fluids and first dose of IV antibiotics in the ER today.  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 This has unfortunately gotten bigger and there is evidence of lymph node enlargement concerning for underlying cancer. This could be contributing to generalized fatigue.  Follow-up with your primary care doctor regarding your visit to the ER today.  Do not hesitate to come back if unable to keep down any antibiotics, fluids or food, worsening mental status, severe pain, or any other symptoms concerning to you.

## 2022-11-22 NOTE — ED Triage Notes (Signed)
Pt arrived by [place] [facility] from [facility].  Staff states pt has hx of UTI's and isn't his normal self.

## 2022-11-22 NOTE — ED Provider Notes (Signed)
[facility] EMERGENCY DEPARTMENT Provider Note   CSN: [id number] Arrival date & time: 11/22/22  1508     History  Chief Complaint  Patient presents with   Fatigue    Chase Chase is a 88 y.o. male.  With PMH of complete heart block status post pacemaker placement, mediastinal mass, hypothyroidism with recent admission early November 2023 for enterococcal UTI, rhabdomyolysis, AKI brought in by EMS from [place] with reports from daughter of increased fatigue and not acting like himself.  She said over the past 2 days he has become more fatigued, somnolent and overall lethargic.  He has not been eating or drinking much.  He has not been responding as much as usual.  He did have a fall about a month ago when he was admitted but denies any recent falls since then.  She denies him complaining of any pain.  He has not been complaining of any chest pain, abdominal pain, no vomiting, no diarrhea, no fevers or cough.  She does note that he has a history of recurrent UTIs and typically it starts presenting this way.  She is concerned he may have another UTI.  When I speak to the patient, he is denying any current complaints.  He says he does not know why he has not been eating as much as usual.  HPI     Home Medications Prior to Admission medications   Medication Sig Start Date End Date Taking? Authorizing Provider  cefdinir (OMNICEF) 300 MG capsule Take 1 capsule (300 mg total) by mouth 2 (two) times daily for 10 days. 11/22/22 12/02/22 Yes Elgie Congo, MD  ondansetron (ZOFRAN-ODT) 4 MG disintegrating tablet Take 1 tablet (4 mg total) by mouth every 8 (eight) hours as needed for nausea or vomiting. 11/22/22  Yes Elgie Congo, MD  Cholecalciferol (VITAMIN D3) 125 MCG (5000 UT) CAPS Take 5,000 Units by mouth See admin instructions. Take 1 capsule by mouth for 10 days every month    [provider]  latanoprost (XALATAN) 0.005 % ophthalmic solution Place 1  drop into both eyes at bedtime as needed.  08/01/19   [provider]  levothyroxine (SYNTHROID) 50 MCG tablet Take 50 mcg by mouth daily. 12/19/19   [provider]  lidocaine (XYLOCAINE) 2 % solution  09/25/22   [provider]  meclizine (ANTIVERT) 25 MG tablet Take 25 mg by mouth as needed for dizziness (rarely needs).    [provider]  Misc Natural Products (PROSTATE HEALTH PO) Take 2 tablets by mouth daily.    [provider]  Omega-3 Fatty Acids (FISH OIL) 645 MG CAPS Take 1,290 mg by mouth daily. Take 2 capsules daily for cholesterol.    [provider]  zinc gluconate 50 MG tablet Take 50 mg by mouth daily.    [provider]      Allergies    Patient has no known allergies.    Review of Systems   Review of Systems  Physical Exam Updated Vital Signs  normal rhythm.BP 116/64   Pulse 72   Temp 98.1 F (36.7 C) (Oral)   Resp 12   Ht '5\' 7"'$  (1.702 m)   Wt 77 kg   SpO2 98%   BMI 26.59 kg/m  Physical Exam Constitutional: Alert and orientedx3.  Lying in bed quiet with no acute distress, nontoxic Eyes: Conjunctivae are normal. ENT      Head: Normocephalic and atraumatic.      Nose: No  congestion.      Mouth/Throat: Mucous membranes are moist.      Neck: No stridor. Cardiovascular: S1, S2,  Normal and symmetric distal pulses are present in all extremities.Warm and well perfused. Respiratory: Normal respiratory effort. Breath sounds are normal.  O2 sat 97 on RA Gastrointestinal: Soft and nontender.  Musculoskeletal: Normal range of motion in all extremities. Trace nontender pitting edema of lower shins Neurologic: Normal speech and language.  No facial droop.  Sensation grossly intact.  No drift of upper extremities.  Equal strength in bilateral upper and lower extremities.  No gross focal neurologic deficits are appreciated. Skin: Skin is warm, dry  Psychiatric: Calm  ED Results / Procedures / Treatments    Labs (all labs ordered are listed, but only abnormal results are displayed) Labs Reviewed  COMPREHENSIVE METABOLIC PANEL - Abnormal; Notable for the following components:      Result Value   Glucose, Bld 126 (*)    BUN 39 (*)    Creatinine, Ser 1.28 (*)    Calcium 12.3 (*)    Total Protein 6.1 (*)    Albumin 3.1 (*)    GFR, Estimated 51 (*)    All other components within normal limits  CBC WITH DIFFERENTIAL/PLATELET - Abnormal; Notable for the following components:   RBC 4.04 (*)    MCV 104.0 (*)    All other components within normal limits  BLOOD GAS, VENOUS - Abnormal; Notable for the following components:   pH, Ven 7.46 (*)    pO2, Ven <31 (*)    Bicarbonate 39.1 (*)    Acid-Base Excess 12.8 (*)    All other components within normal limits  URINALYSIS, ROUTINE W REFLEX MICROSCOPIC - Abnormal; Notable for the following components:   APPearance HAZY (*)    Protein, ur 30 (*)    Leukocytes,Ua LARGE (*)    WBC, UA >50 (*)    All other components within normal limits  CK - Abnormal; Notable for the following components:   Total CK 20 (*)    All other components within normal limits  URINE CULTURE  MAGNESIUM  TSH  T4, FREE  TROPONIN I (HIGH SENSITIVITY)    EKG EKG Interpretation  Date/Time:  Wednesday November 22 2022 15:34:08 EST Ventricular Rate:  77 PR Interval:  189 QRS Duration: 177 QT Interval:  458 QTC Calculation: 519 R Axis:   266 Text Interpretation: Sinus rhythm Right bundle branch block Consider left ventricular hypertrophy Simiar to June 2023 tracing Confirmed by Georgina Snell [phone]) on 11/22/2022 3:52:08 PM  Radiology CT CHEST ABDOMEN PELVIS WO CONTRAST  Result Date: 11/22/2022 CLINICAL DATA:  Altered mental status, history of kidney stones and previous imaging that showed a large mass in the posterior mediastinum/lower chest. * Tracking Code: BO * EXAM: CT CHEST, ABDOMEN AND PELVIS WITHOUT CONTRAST TECHNIQUE: Multidetector CT imaging of the  chest, abdomen and pelvis was performed following the standard protocol without IV contrast. RADIATION DOSE REDUCTION: This exam was performed according to the departmental dose-optimization program which includes automated exposure control, adjustment of the mA and/or kV according to patient size and/or use of iterative reconstruction technique. COMPARISON:  Comparison made with October 10, 2022. FINDINGS: CT CHEST FINDINGS Cardiovascular: Calcified aortic atherosclerotic changes. The aorta passes through a large posterior mediastinal mass better characterized on recent contrasted CT. This mass measures up to 11 x 9.5 cm previously 9.3 x 7.1 cm. The aorta itself is not well evaluated as it passes through the mass. The heart  is displaced anteriorly in the chest due to mass effect from the posterior mediastinal and retrocrural mass. Signs of coronary artery calcification as before. Central pulmonary vasculature is stable. Limited assessment of cardiovascular structures given lack of intravenous contrast. Mediastinum/Nodes: Mass in the posterior chest extending beneath the RIGHT and LEFT diaphragmatic crus and behind the heart, did displacing the esophagus and encasing the aorta appears to have enlarged in the short interval since previous imaging. Additional adenopathy has developed between the heart in the aorta (image 33/2) 19 mm lymph node in this area previously 11 mm. No thoracic inlet lymphadenopathy. No axillary lymphadenopathy. Lungs/Pleura: Small LEFT-sided pleural effusion is similar volume. Trace RIGHT pleural effusion has developed since previous imaging. No dense consolidation. No sign of pneumothorax. Increasing volume loss at the LEFT lung base overlying the mass which extends from the posterior mediastinum into the LEFT chest. Airways are patent. Musculoskeletal: See below for full musculoskeletal details. Soft tissue abuts the anterior surface of the spine in the LEFT lateral aspect of the spine  without bony destruction. CT ABDOMEN PELVIS FINDINGS Hepatobiliary: Liver with smooth contours. No visible lesion. Post cholecystectomy. No gross biliary duct distension. Pancreas: Mild atrophy of the pancreas without ductal dilation, inflammation or visible lesion. Spleen: Normal size and contour. Adrenals/Urinary Tract: RIGHT kidney: Nephrolithiasis on the RIGHT largest 14 mm in the upper pole. No ureteral calculi on the RIGHT. Intermediate density RIGHT renal lesion measures 34 Hounsfield units previously characterized as a hemorrhagic cyst. Additional hemorrhagic cyst in the interpolar RIGHT kidney. No additional dedicated imaging follow-up is recommended for these findings of renal cysts. Large renal calculi in the LEFT renal pelvis similar to recent imaging. Distension of collecting system elements is unchanged and there is stranding around the LEFT renal pelvis. Marked renal cortical scarring is present bilaterally. Signs of bladder diverticuli are again noted. Stomach/Bowel: No acute gastrointestinal findings. Colonic diverticulosis. Normal appendix. Vascular/Lymphatic: Mildly enlarged lymph nodes in the LEFT retroperitoneum are stable largest measuring 12 mm (image 76/2) Reproductive: Prostatomegaly as on previous imaging. Other: No ascites. Musculoskeletal: Osteopenia. No acute bone finding. No destructive bone process. Spinal degenerative changes. IMPRESSION: 1. Interval enlargement of the posterior mediastinal and retrocrural mass, now measuring up to 11 x 9.5 cm previously 9.3 x 7.1 cm. Findings are consistent with worsening of disease. 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 2. New mediastinal adenopathy also suspicious for worsening of disease. 3. Findings associated with LEFT retroperitoneal nodal enlargement as well. 4. Stable small LEFT and trace RIGHT pleural effusions. 5. Peripelvic stranding with continued dilation/mild hydronephrosis of LEFT intrarenal  collecting systems. Correlate with urinalysis. Findings could represent pyelonephritis with an element of obstruction but renal collecting system dilation is similar to recent imaging. 6. RIGHT-sided nephrolithiasis as well. 7. Prostatomegaly with signs of bladder diverticuli. 8. Colonic diverticulosis without evidence of acute diverticulitis. 9. Aortic atherosclerosis. Aortic Atherosclerosis (ICD10-I70.0). Electronically Signed   By: Zetta Bills M.D.   On: 11/22/2022 16:38   CT Head Wo Contrast  Result Date: 11/22/2022 CLINICAL DATA:  Altered level of consciousness EXAM: CT HEAD WITHOUT CONTRAST TECHNIQUE: Contiguous axial images were obtained from the base of the skull through the vertex without intravenous contrast. RADIATION DOSE REDUCTION: This exam was performed according to the departmental dose-optimization program which includes automated exposure control, adjustment of the mA and/or kV according to patient size and/or use of iterative reconstruction technique. COMPARISON:  10/09/2022 FINDINGS: Brain: Chronic left occipital cortical infarct. No acute infarct or hemorrhage. Lateral ventricles and  midline structures are stable. No acute extra-axial fluid collections. No mass effect. Vascular: No hyperdense vessel or unexpected calcification. Skull: Normal. Negative for fracture or focal lesion. Sinuses/Orbits: No acute finding. Other: None. IMPRESSION: 1. Stable head CT, no acute process. Electronically Signed   By: Randa Ngo M.D.   On: 11/22/2022 16:14    Procedures Procedures  Remaining constant cardiac monitoring which I personally reviewed.  Normal rates with wide Medications Ordered in ED Medications  sodium chloride 0.9 % bolus 500 mL (500 mLs Intravenous New Bag/Given 11/22/22 1717)  cefTRIAXone (ROCEPHIN) 1 g in sodium chloride 0.9 % 100 mL IVPB (1 g Intravenous New Bag/Given 11/22/22 1718)    ED Course/ Medical Decision Making/ A&P Clinical Course as of 11/22/22 1802  Wed  Nov 22, 2022  1616 Spoke with [employer] rep who reviewed input from pacemaker last night, she receives input daily at midnight, and he has had no abnormalities. [VB]    Clinical Course User Index [VB] Elgie Congo, MD                           Medical Decision Making Chase Chase is a 88 y.o. male.  With PMH of complete heart block status post pacemaker placement, mediastinal mass, hypothyroidism with recent admission early November 2023 for enterococcal UTI, rhabdomyolysis, AKI brought in by EMS from [place] with reports from daughter of increased fatigue and not acting like himself.   The patient presents with change in overall status from baseline. The differential for altered mental status is broad and includes, but is not limited to: electrolyte abnormalities (such as hypoglycemia, hypoNa), thyroid abnormalities, hypercarbia, atypical ACS, infection such as UTI among multiple other etiologies.  Patient presented hemodynamically stable.  Unlikely sepsis or shock.  There were no focal deficits on exam, unlikely CVA.  EKG obtained which I personally reviewed showed sinus rhythm with right bundle branch block and nonspecific T wave inversion in aVL.  No acute ST/T changes.  QTc 519.  High sensitive troponin 14 reassuring, no evidence of NSTEMI.  Received fax from [place] with patient's most recent to review 11/21/2022 showed no evidence of atrial arrhythmia, 75% battery, no anomalies noted.  Patient had CT head without contrast which I personally reviewed, no acute abnormalities, no ICH.  CT chest abdomen pelvis without contrast obtained which showed evidence of increased size of the known mediastinal mass and increased lymphadenopathy concerning for cancerous etiology.  Also evidence of small stable bilateral pleural effusions.  No hypoxia on exam.  Peripelvic stranding around the left kidney with no evidence of obstructive stone but findings concerning for  pyelonephritis.  UA consistent with UTI with large leukocyte esterase and greater than 50 WBCs.  No RBCs.  No nitrite.  Labs generally reassuring.  There was evidence of mild AKI creatinine 1.28 with elevated BUN to creatinine ratio 30.46 consistent with prerenal injury and decreased p.o. intake.  Due to concern for pyelonephritis and mild AKI, patient given fluids and IV Rocephin in ED.  I spoke with patient's daughter at bedside, I told her about increased size of mediastinal mass and concern for underlying cancer.  Patient has known about mass and does not want to pursue any further intervention.  Likely contributing to generalized fatigue as well as suspected kidney infection.  Since he is at mental baseline and stable, I did offer admission versus going back to facility and she would like him to go back  to the facility with antibiotics at this time.  I do believe that is an okay plan as he is still tolerating p.o., hemodynamically stable and at mental baseline.  Discharging patient with 10 days of cefdinir and strict return precautions.  Advised to increase fluid intake.  Advise follow-up with PCP regarding visit to the ER today.  Patient and patient's daughter in agreement with plan.   Amount and/or Complexity of Data Reviewed Labs: ordered. Radiology: ordered.  Risk Prescription drug management.    Final Clinical Impression(s) / ED Diagnoses Final diagnoses:  Other fatigue  Pyelonephritis  AKI (acute kidney injury) ([place])  Mediastinal mass    Rx / DC Orders ED Discharge Orders          Ordered    cefdinir (OMNICEF) 300 MG capsule  2 times daily        11/22/22 1714    ondansetron (ZOFRAN-ODT) 4 MG disintegrating tablet  Every 8 hours PRN        11/22/22 1714              Elgie Congo, MD 11/22/22 [phone]

## 2022-11-23 DIAGNOSIS — M6282 Rhabdomyolysis: Secondary | ICD-10-CM | POA: Diagnosis not present

## 2022-11-23 DIAGNOSIS — W19XXXD Unspecified fall, subsequent encounter: Secondary | ICD-10-CM | POA: Diagnosis not present

## 2022-11-23 DIAGNOSIS — I700 Atherosclerosis of aorta: Secondary | ICD-10-CM | POA: Diagnosis not present

## 2022-11-23 DIAGNOSIS — E039 Hypothyroidism, unspecified: Secondary | ICD-10-CM | POA: Diagnosis not present

## 2022-11-23 DIAGNOSIS — I959 Hypotension, unspecified: Secondary | ICD-10-CM | POA: Diagnosis not present

## 2022-11-23 DIAGNOSIS — I442 Atrioventricular block, complete: Secondary | ICD-10-CM | POA: Diagnosis not present

## 2022-11-23 LAB — URINE CULTURE: Culture: <10000 — AB

## 2022-11-27 DIAGNOSIS — R059 Cough, unspecified: Secondary | ICD-10-CM | POA: Diagnosis not present

## 2022-11-27 DIAGNOSIS — W19XXXD Unspecified fall, subsequent encounter: Secondary | ICD-10-CM | POA: Diagnosis not present

## 2022-11-27 DIAGNOSIS — N401 Enlarged prostate with lower urinary tract symptoms: Secondary | ICD-10-CM | POA: Diagnosis not present

## 2022-11-27 DIAGNOSIS — E039 Hypothyroidism, unspecified: Secondary | ICD-10-CM | POA: Diagnosis not present

## 2022-11-27 DIAGNOSIS — I959 Hypotension, unspecified: Secondary | ICD-10-CM | POA: Diagnosis not present

## 2022-11-27 DIAGNOSIS — E038 Other specified hypothyroidism: Secondary | ICD-10-CM | POA: Diagnosis not present

## 2022-11-27 DIAGNOSIS — I700 Atherosclerosis of aorta: Secondary | ICD-10-CM | POA: Diagnosis not present

## 2022-11-27 DIAGNOSIS — H409 Unspecified glaucoma: Secondary | ICD-10-CM | POA: Diagnosis not present

## 2022-11-27 DIAGNOSIS — M6282 Rhabdomyolysis: Secondary | ICD-10-CM | POA: Diagnosis not present

## 2022-11-27 DIAGNOSIS — J189 Pneumonia, unspecified organism: Secondary | ICD-10-CM | POA: Diagnosis not present

## 2022-11-27 DIAGNOSIS — R051 Acute cough: Secondary | ICD-10-CM | POA: Diagnosis not present

## 2022-11-27 DIAGNOSIS — N390 Urinary tract infection, site not specified: Secondary | ICD-10-CM | POA: Diagnosis not present

## 2022-11-27 DIAGNOSIS — I442 Atrioventricular block, complete: Secondary | ICD-10-CM | POA: Diagnosis not present

## 2022-11-28 ENCOUNTER — Encounter (HOSPITAL_COMMUNITY): Payer: Self-pay

## 2022-11-28 ENCOUNTER — Emergency Department (HOSPITAL_COMMUNITY): Payer: Medicare Other

## 2022-11-28 ENCOUNTER — Other Ambulatory Visit: Payer: Self-pay

## 2022-11-28 ENCOUNTER — Emergency Department (HOSPITAL_COMMUNITY)
Admission: EM | Admit: 2022-11-28 | Discharge: 2022-11-28 | Disposition: A | Discharge status: Skilled Nursing Facility | Payer: Medicare Other | Attending: Emergency Medicine | Admitting: Emergency Medicine

## 2022-11-28 DIAGNOSIS — W19XXXA Unspecified fall, initial encounter: Secondary | ICD-10-CM | POA: Diagnosis not present

## 2022-11-28 DIAGNOSIS — S8002XA Contusion of left knee, initial encounter: Secondary | ICD-10-CM | POA: Diagnosis not present

## 2022-11-28 DIAGNOSIS — W1830XA Fall on same level, unspecified, initial encounter: Secondary | ICD-10-CM | POA: Insufficient documentation

## 2022-11-28 DIAGNOSIS — Z950 Presence of cardiac pacemaker: Secondary | ICD-10-CM | POA: Diagnosis not present

## 2022-11-28 DIAGNOSIS — T1490XA Injury, unspecified, initial encounter: Secondary | ICD-10-CM | POA: Diagnosis not present

## 2022-11-28 DIAGNOSIS — R530 Neoplastic (malignant) related fatigue: Secondary | ICD-10-CM | POA: Diagnosis not present

## 2022-11-28 DIAGNOSIS — I959 Hypotension, unspecified: Secondary | ICD-10-CM | POA: Diagnosis not present

## 2022-11-28 DIAGNOSIS — M25562 Pain in left knee: Secondary | ICD-10-CM | POA: Diagnosis not present

## 2022-11-28 DIAGNOSIS — R5381 Other malaise: Secondary | ICD-10-CM | POA: Diagnosis not present

## 2022-11-28 DIAGNOSIS — Z043 Encounter for examination and observation following other accident: Secondary | ICD-10-CM | POA: Diagnosis not present

## 2022-11-28 DIAGNOSIS — Z743 Need for continuous supervision: Secondary | ICD-10-CM | POA: Diagnosis not present

## 2022-11-28 NOTE — Discharge Instructions (Signed)
Return to the emergency department for any new and/or concerning symptoms. 

## 2022-11-28 NOTE — ED Provider Notes (Signed)
[facility] EMERGENCY DEPARTMENT Provider Note   CSN: [id number] Arrival date & time: 11/28/22  0100     History  Chief Complaint  Patient presents with   Kurtis Anastasia is a 88 y.o. male.  Patient is a 88 year old male with past medical history of hyperlipidemia, glaucoma, complete heart block with pacemaker.  Patient brought from his extended care facility for evaluation of a fall.  He was apparently attempting to get out of bed to go to the bathroom when he ended up on the floor.  Patient denies any injury to me, but did report left knee pain to the nurse.  He denies having struck his head or losing consciousness.  He denies any neck pain, chest pain, abdominal pain, hip pain, or difficulty breathing.  Patient brought by EMS for evaluation of this.  The history is provided by the patient.       Home Medications Prior to Admission medications   Medication Sig Start Date End Date Taking? Authorizing Provider  alfuzosin (UROXATRAL) 10 MG 24 hr tablet Take 10 mg by mouth daily. 11/14/22   [provider]  cefdinir (OMNICEF) 300 MG capsule Take 1 capsule (300 mg total) by mouth 2 (two) times daily for 10 days. 11/22/22 12/02/22  Elgie Congo, MD  Cholecalciferol (VITAMIN D3) 125 MCG (5000 UT) CAPS Take 5,000 Units by mouth See admin instructions. Take 1 capsule by mouth for 10 days every month    [provider]  latanoprost (XALATAN) 0.005 % ophthalmic solution Place 1 drop into both eyes at bedtime as needed.  08/01/19   [provider]  levothyroxine (SYNTHROID) 50 MCG tablet Take 50 mcg by mouth daily. 12/19/19   [provider]  lidocaine (XYLOCAINE) 2 % solution  09/25/22   [provider]  meclizine (ANTIVERT) 25 MG tablet Take 25 mg by mouth as needed for dizziness (rarely needs). Patient not taking: Reported on 11/22/2022    [provider]  Misc Natural Products ([place]) Take 2 tablets by mouth  daily.    [provider]  Omega 3 1200 MG CAPS Take 1 capsule by mouth daily.    [provider]  Omega-3 Fatty Acids (FISH OIL) 645 MG CAPS Take 1,290 mg by mouth daily. Take 2 capsules daily for cholesterol. Patient not taking: Reported on 11/22/2022    [provider]  ondansetron (ZOFRAN-ODT) 4 MG disintegrating tablet Take 1 tablet (4 mg total) by mouth every 8 (eight) hours as needed for nausea or vomiting. 11/22/22   Elgie Congo, MD  zinc gluconate 50 MG tablet Take 50 mg by mouth daily.    [provider]      Allergies    Patient has no known allergies.    Review of Systems   Review of Systems  All other systems reviewed and are negative.   Physical Exam Updated Vital Signs BP 108/62   Pulse 94   Temp 97.7 F (36.5 C)   Resp 18   SpO2 96%  Physical Exam Vitals and nursing note reviewed.  Constitutional:      General: He is not in acute distress.    Appearance: He is well-developed. He is not diaphoretic.  HENT:     Head: Normocephalic and atraumatic.  Cardiovascular:     Rate and Rhythm: Normal rate and regular rhythm.     Heart sounds: No murmur heard.    No friction rub.  Pulmonary:  Effort: Pulmonary effort is normal. No respiratory distress.     Breath sounds: Normal breath sounds. No wheezing or rales.  Abdominal:     General: Bowel sounds are normal. There is no distension.     Palpations: Abdomen is soft.     Tenderness: There is no abdominal tenderness.  Musculoskeletal:        General: Normal range of motion.     Cervical back: Normal range of motion and neck supple.     Comments: There is no tenderness over the hips.  He has good range of motion and is able to lift both legs off of the bed without assistance.  The left knee appears grossly normal.  There are no overt signs of trauma.  He has good range of motion with minimal discomfort.  There is no crepitus or instability.  Skin:    General: Skin is  warm and dry.  Neurological:     Mental Status: He is alert and oriented to person, place, and time.     Coordination: Coordination normal.     ED Results / Procedures / Treatments   Labs (all labs ordered are listed, but only abnormal results are displayed) Labs Reviewed - No data to display  EKG None  Radiology No results found.  Procedures Procedures    Medications Ordered in ED Medications - No data to display  ED Course/ Medical Decision Making/ A&P  Patient brought by EMS for evaluation of a fall that occurred at his extended care facility.  He was attempting to get up to go to the bathroom when he apparently got his feet tangled up and ended up on the floor.  He arrives here without complaint, but did express left knee pain to the nurse.  Vital signs are stable and physical examination is basically unremarkable.  There are no overt signs of head trauma and he appears neurologically at baseline.  Chest and abdominal exams are unremarkable.  I did obtain x-rays of the left knee which were negative, but see no indication to perform additional imaging as I see no overt signs of trauma or physical findings of concern.  Patient to be discharged with as needed return.  Final Clinical Impression(s) / ED Diagnoses Final diagnoses:  None    Rx / DC Orders ED Discharge Orders     None         Veryl Speak, MD 11/28/22 [phone]

## 2022-11-28 NOTE — ED Triage Notes (Signed)
Pt via [facility] called out for fall. Complaining of left side pain.

## 2022-11-29 DIAGNOSIS — E039 Hypothyroidism, unspecified: Secondary | ICD-10-CM | POA: Diagnosis not present

## 2022-11-29 DIAGNOSIS — W19XXXD Unspecified fall, subsequent encounter: Secondary | ICD-10-CM | POA: Diagnosis not present

## 2022-11-29 DIAGNOSIS — M6282 Rhabdomyolysis: Secondary | ICD-10-CM | POA: Diagnosis not present

## 2022-11-29 DIAGNOSIS — I700 Atherosclerosis of aorta: Secondary | ICD-10-CM | POA: Diagnosis not present

## 2022-11-29 DIAGNOSIS — I959 Hypotension, unspecified: Secondary | ICD-10-CM | POA: Diagnosis not present

## 2022-11-29 DIAGNOSIS — I442 Atrioventricular block, complete: Secondary | ICD-10-CM | POA: Diagnosis not present

## 2022-12-02 ENCOUNTER — Other Ambulatory Visit: Payer: Self-pay

## 2022-12-02 ENCOUNTER — Encounter (HOSPITAL_COMMUNITY): Payer: Self-pay

## 2022-12-02 ENCOUNTER — Emergency Department (HOSPITAL_COMMUNITY): Payer: Medicare Other

## 2022-12-02 ENCOUNTER — Emergency Department (HOSPITAL_COMMUNITY)
Admission: EM | Admit: 2022-12-02 | Discharge: 2022-12-02 | Disposition: A | Discharge status: Home / Self Care | Payer: Medicare Other | Attending: Emergency Medicine | Admitting: Emergency Medicine

## 2022-12-02 DIAGNOSIS — R0789 Other chest pain: Secondary | ICD-10-CM | POA: Diagnosis not present

## 2022-12-02 DIAGNOSIS — Z1152 Encounter for screening for COVID-19: Secondary | ICD-10-CM | POA: Diagnosis not present

## 2022-12-02 DIAGNOSIS — R531 Weakness: Secondary | ICD-10-CM | POA: Diagnosis not present

## 2022-12-02 DIAGNOSIS — R079 Chest pain, unspecified: Secondary | ICD-10-CM | POA: Diagnosis not present

## 2022-12-02 DIAGNOSIS — N390 Urinary tract infection, site not specified: Secondary | ICD-10-CM | POA: Diagnosis not present

## 2022-12-02 DIAGNOSIS — J90 Pleural effusion, not elsewhere classified: Secondary | ICD-10-CM | POA: Diagnosis not present

## 2022-12-02 LAB — CBC WITH DIFFERENTIAL/PLATELET
Abs Immature Granulocytes: 0.03 10*3/uL (ref 0.00–0.07)
Basophils Absolute: 0.1 10*3/uL (ref 0.0–0.1)
Basophils Relative: 1 %
Eosinophils Absolute: 0.3 10*3/uL (ref 0.0–0.5)
Eosinophils Relative: 4 %
HCT: 37.7 % — ABNORMAL LOW (ref 39.0–52.0)
Hemoglobin: 12.7 g/dL — ABNORMAL LOW (ref 13.0–17.0)
Immature Granulocytes: 0 %
Lymphocytes Relative: 16 %
Lymphs Abs: 1.2 10*3/uL (ref 0.7–4.0)
MCH: 34.7 pg — ABNORMAL HIGH (ref 26.0–34.0)
MCHC: 33.7 g/dL (ref 30.0–36.0)
MCV: 103 fL — ABNORMAL HIGH (ref 80.0–100.0)
Monocytes Absolute: 1 10*3/uL (ref 0.1–1.0)
Monocytes Relative: 13 %
Neutro Abs: 5.1 10*3/uL (ref 1.7–7.7)
Neutrophils Relative %: 66 %
Platelets: 162 10*3/uL (ref 150–400)
RBC: 3.66 MIL/uL — ABNORMAL LOW (ref 4.22–5.81)
RDW: 12.3 % (ref 11.5–15.5)
WBC: 7.6 10*3/uL (ref 4.0–10.5)
nRBC: 0 % (ref 0.0–0.2)

## 2022-12-02 LAB — URINALYSIS, ROUTINE W REFLEX MICROSCOPIC
Bilirubin Urine: NEGATIVE
Glucose, UA: NEGATIVE mg/dL
Hgb urine dipstick: NEGATIVE
Ketones, ur: NEGATIVE mg/dL
Nitrite: NEGATIVE
Protein, ur: 30 mg/dL — AB
Specific Gravity, Urine: 1.012 (ref 1.005–1.030)
WBC, UA: >50 WBC/hpf — ABNORMAL HIGH (ref 0–5)
pH: 7 (ref 5.0–8.0)

## 2022-12-02 LAB — COMPREHENSIVE METABOLIC PANEL
ALT: 17 U/L (ref 0–44)
AST: 25 U/L (ref 15–41)
Albumin: 3.3 g/dL — ABNORMAL LOW (ref 3.5–5.0)
Alkaline Phosphatase: 87 U/L (ref 38–126)
Anion gap: 8 (ref 5–15)
BUN: 49 mg/dL — ABNORMAL HIGH (ref 8–23)
CO2: 30 mmol/L (ref 22–32)
Calcium: 12.1 mg/dL — ABNORMAL HIGH (ref 8.9–10.3)
Chloride: 104 mmol/L (ref 98–111)
Creatinine, Ser: 1.71 mg/dL — ABNORMAL HIGH (ref 0.61–1.24)
GFR, Estimated: 36 mL/min — ABNORMAL LOW (ref >60)
Glucose, Bld: 117 mg/dL — ABNORMAL HIGH (ref 70–99)
Potassium: 3.6 mmol/L (ref 3.5–5.1)
Sodium: 142 mmol/L (ref 135–145)
Total Bilirubin: 0.8 mg/dL (ref 0.3–1.2)
Total Protein: 6.1 g/dL — ABNORMAL LOW (ref 6.5–8.1)

## 2022-12-02 LAB — TROPONIN I (HIGH SENSITIVITY): Troponin I (High Sensitivity): 17 ng/L (ref <18)

## 2022-12-02 LAB — CBG MONITORING, ED: Glucose-Capillary: 110 mg/dL — ABNORMAL HIGH (ref 70–99)

## 2022-12-02 LAB — RESP PANEL BY RT-PCR (RSV, FLU A&B, COVID)  RVPGX2
Influenza A by PCR: NEGATIVE
Influenza B by PCR: NEGATIVE
Resp Syncytial Virus by PCR: NEGATIVE
SARS Coronavirus 2 by RT PCR: NEGATIVE

## 2022-12-02 MED ORDER — SODIUM CHLORIDE 0.9 % IV BOLUS
1000.0000 mL | Freq: Once | INTRAVENOUS | Status: AC
  Administered 2022-12-02: 1000 mL via INTRAVENOUS

## 2022-12-02 MED ORDER — FOSFOMYCIN TROMETHAMINE 3 G PO PACK
3.0000 g | PACK | Freq: Once | ORAL | Status: AC
  Administered 2022-12-02: 3 g via ORAL
  Filled 2022-12-02: qty 3

## 2022-12-02 NOTE — ED Provider Notes (Signed)
[facility] EMERGENCY DEPARTMENT Provider Note   CSN: [id number] Arrival date & time: 12/02/22  1211     History {Add pertinent medical, surgical, social history, OB history to HPI:1} Chief Complaint  Patient presents with   Weakness    Chase Chase is a 88 y.o. male with a history of hyperlipidemia, glaucoma, heart block status post pacemaker, presenting from home by EMS with concern for generalized weakness.  This is the patient's third visit to the ER this month, for similar complaints or symptoms.  The patient himself is an extremely poor historian, cannot me any information at all regarding why he is here.  EMS reports that they were called out because the patient had been having weakness and difficulty walking and progressive decline in function over the past month.  Patient's daughter Chase Chase by phone reports patient has been "weak" for 10 days, complaining of chest pain as well.   The patient was discharged the hospital approximately 2 months ago at the end of November.  At that time his family had found the patient lying on the floor, and he was found to have an acute kidney injury with rhabdo.  The patient had a positive urine culture and was started on IV fluids and antibiotics.  He was hallucinating and confused in the hospital felt to be secondary to his UTI.  He was discharged to a SNF.  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  There is stable bilateral pleural effusions.  Possibility of pyelonephritis with stranding around the left kidney.  Patient seen again in ED on 11/28/22 after being sent from nursing facility for a fall.  The patient has a signed DNR at the bedside.  HPI     Home Medications Prior to Admission medications   Medication Sig Start Date End Date  Taking? Authorizing Provider  alfuzosin (UROXATRAL) 10 MG 24 hr tablet Take 10 mg by mouth daily. 11/14/22   [provider]  cefdinir (OMNICEF) 300 MG capsule Take 1 capsule (300 mg total) by mouth 2 (two) times daily for 10 days. 11/22/22 12/02/22  Elgie Congo, MD  Cholecalciferol (VITAMIN D3) 125 MCG (5000 UT) CAPS Take 5,000 Units by mouth See admin instructions. Take 1 capsule by mouth for 10 days every month    [provider]  latanoprost (XALATAN) 0.005 % ophthalmic solution Place 1 drop into both eyes at bedtime as needed.  08/01/19   [provider]  levothyroxine (SYNTHROID) 50 MCG tablet Take 50 mcg by mouth daily. 12/19/19   [provider]  lidocaine (XYLOCAINE) 2 % solution  09/25/22   [provider]  meclizine (ANTIVERT) 25 MG tablet Take 25 mg by mouth as needed for dizziness (rarely needs). Patient not taking: Reported on 11/22/2022    [provider]  Misc Natural Products ([place]) Take 2 tablets by mouth daily.    [provider]  Omega 3 1200 MG CAPS Take 1 capsule by mouth daily.    [provider]  Omega-3 Fatty Acids (FISH OIL) 645 MG CAPS Take 1,290 mg by mouth daily. Take 2 capsules daily for cholesterol. Patient not taking: Reported on 11/22/2022    [provider]  ondansetron (ZOFRAN-ODT) 4 MG disintegrating tablet Take 1 tablet (4 mg total) by mouth every 8 (  eight) hours as needed for nausea or vomiting. 11/22/22   Elgie Congo, MD  zinc gluconate 50 MG tablet Take 50 mg by mouth daily.    [provider]      Allergies    Patient has no known allergies.    Review of Systems   Review of Systems  Physical Exam Updated Vital Signs BP 97/68   Pulse 69   Resp (!) 22   Ht '5\' 7"'$  (1.702 m)   Wt 93.1 kg   SpO2 98%   BMI 32.15 kg/m  Physical Exam Constitutional:      General: He is not in acute distress. HENT:     Head: Normocephalic and  atraumatic.  Eyes:     Conjunctiva/sclera: Conjunctivae normal.     Pupils: Pupils are equal, round, and reactive to light.  Cardiovascular:     Rate and Rhythm: Normal rate and regular rhythm.     Heart sounds: Murmur heard.  Pulmonary:     Effort: Pulmonary effort is normal. No respiratory distress.  Abdominal:     General: There is no distension.     Tenderness: There is no abdominal tenderness.  Skin:    General: Skin is warm and dry.  Neurological:     General: No focal deficit present.     Mental Status: He is alert. Mental status is at baseline.     ED Results / Procedures / Treatments   Labs (all labs ordered are listed, but only abnormal results are displayed) Labs Reviewed  CBG MONITORING, ED - Abnormal; Notable for the following components:      Result Value   Glucose-Capillary 110 (*)    All other components within normal limits  RESP PANEL BY RT-PCR (RSV, FLU A&B, COVID)  RVPGX2  COMPREHENSIVE METABOLIC PANEL  CBC WITH DIFFERENTIAL/PLATELET  URINALYSIS, ROUTINE W REFLEX MICROSCOPIC  TROPONIN I (HIGH SENSITIVITY)    EKG EKG Interpretation  Date/Time:  Saturday December 02 2022 12:21:53 EST Ventricular Rate:  68 PR Interval:  215 QRS Duration: 161 QT Interval:  456 QTC Calculation: 485 R Axis:   -88 Text Interpretation: Sinus rhythm Borderline prolonged PR interval Probable left atrial enlargement Nonspecific IVCD with LAD Left ventricular hypertrophy No sig changes from Nov 22 2022 ecg Confirmed by Octaviano Glow [id number] on 12/02/2022 1:00:00 PM  Radiology No results found.  Procedures Procedures  {Document cardiac monitor, telemetry assessment procedure when appropriate:1}  Medications Ordered in ED Medications - No data to display  ED Course/ Medical Decision Making/ A&P                           Medical Decision Making Amount and/or Complexity of Data Reviewed Labs: ordered.   This patient presents to the ED with concern for ***. This  involves an extensive number of treatment options, and is a complaint that carries with it a high risk of complications and morbidity.  The differential diagnosis includes ***  Co-morbidities that complicate the patient evaluation: ***  Additional history obtained from ***  External records from outside source obtained and reviewed including ***  I ordered and personally interpreted labs.  The pertinent results include:  ***  I ordered imaging studies including *** I independently visualized and interpreted imaging which showed *** I agree with the radiologist interpretation  The patient was maintained on a cardiac monitor.  I personally viewed and interpreted the cardiac monitored which showed an underlying rhythm of: ***  Per my interpretation the patient's ECG shows ***  I ordered medication including ***  for *** I have reviewed the patients home medicines and have made adjustments as needed  Test Considered: ***  I requested consultation with the ***,  and discussed lab and imaging findings as well as pertinent plan - they recommend: ***  After the interventions noted above, I reevaluated the patient and found that they have: {resolved/improved/worsened:23923::"improved"}  Social Determinants of Health:***  Dispostion:  After consideration of the diagnostic results and the patients response to treatment, I feel that the patent would benefit from ***.   {Document critical care time when appropriate:1} {Document review of labs and clinical decision tools ie heart score, Chads2Vasc2 etc:1}  {Document your independent review of radiology images, and any outside records:1} {Document your discussion with family members, caretakers, and with consultants:1} {Document social determinants of health affecting pt's care:1} {Document your decision making why or why not admission, treatments were needed:1} Final Clinical Impression(s) / ED Diagnoses Final diagnoses:  None    Rx /  DC Orders ED Discharge Orders     None

## 2022-12-02 NOTE — Discharge Instructions (Addendum)
Chase Chase's testing today showed that he once again may have signs of a urine infection.  In the past his urine analysis is showing signs of an infection but his urine culture has not actually grown any significant bugs or organisms.  So this may be a false positive test today.  However I think there is reason enough to treat for possible infection.  We gave him a single dose of fosfomycin in the ER.  This is a single dose antibiotic that does not need further dosing.  I spoke to his daughter and updated her by phone.  I would recommend arranging for a palliative care consultation if possible.  His daughter is interested in discussing future goals of care.  The patient has a very poor prognosis with a large mass inside his chest that is encroaching on her surrounding his aorta and near his heart.  The family or the patient may want to consider options for do not hospitalize or hospice care if he were to become sicker confused again.  A palliative care consult may help facilitate this conversation.

## 2022-12-02 NOTE — ED Triage Notes (Signed)
BIB EMS with complaints of BLE weakness that has worsened over the past month. States that patient has normally been ambulatory with walker but is now w/c bound. Has been treated for UTI and PNA in pas month. Patient denies any recent falling.

## 2022-12-04 LAB — URINE CULTURE: Culture: <10000 — AB

## 2022-12-05 DIAGNOSIS — I700 Atherosclerosis of aorta: Secondary | ICD-10-CM | POA: Diagnosis not present

## 2022-12-05 DIAGNOSIS — E039 Hypothyroidism, unspecified: Secondary | ICD-10-CM | POA: Diagnosis not present

## 2022-12-05 DIAGNOSIS — I442 Atrioventricular block, complete: Secondary | ICD-10-CM | POA: Diagnosis not present

## 2022-12-05 DIAGNOSIS — M6282 Rhabdomyolysis: Secondary | ICD-10-CM | POA: Diagnosis not present

## 2022-12-05 DIAGNOSIS — I959 Hypotension, unspecified: Secondary | ICD-10-CM | POA: Diagnosis not present

## 2022-12-05 DIAGNOSIS — W19XXXD Unspecified fall, subsequent encounter: Secondary | ICD-10-CM | POA: Diagnosis not present

## 2022-12-07 DIAGNOSIS — R296 Repeated falls: Secondary | ICD-10-CM | POA: Diagnosis not present

## 2022-12-07 DIAGNOSIS — S90222A Contusion of left lesser toe(s) with damage to nail, initial encounter: Secondary | ICD-10-CM | POA: Diagnosis not present

## 2022-12-07 DIAGNOSIS — I700 Atherosclerosis of aorta: Secondary | ICD-10-CM | POA: Diagnosis not present

## 2022-12-07 DIAGNOSIS — I442 Atrioventricular block, complete: Secondary | ICD-10-CM | POA: Diagnosis not present

## 2022-12-07 DIAGNOSIS — W19XXXD Unspecified fall, subsequent encounter: Secondary | ICD-10-CM | POA: Diagnosis not present

## 2022-12-07 DIAGNOSIS — M6282 Rhabdomyolysis: Secondary | ICD-10-CM | POA: Diagnosis not present

## 2022-12-07 DIAGNOSIS — I959 Hypotension, unspecified: Secondary | ICD-10-CM | POA: Diagnosis not present

## 2022-12-07 DIAGNOSIS — E039 Hypothyroidism, unspecified: Secondary | ICD-10-CM | POA: Diagnosis not present

## 2022-12-09 ENCOUNTER — Emergency Department (HOSPITAL_COMMUNITY)
Admission: EM | Admit: 2022-12-09 | Discharge: 2022-12-09 | Disposition: A | Discharge status: Skilled Nursing Facility | Payer: Medicare Other | Attending: Emergency Medicine | Admitting: Emergency Medicine

## 2022-12-09 ENCOUNTER — Encounter (HOSPITAL_COMMUNITY): Payer: Self-pay

## 2022-12-09 ENCOUNTER — Emergency Department (HOSPITAL_COMMUNITY): Payer: Medicare Other

## 2022-12-09 ENCOUNTER — Other Ambulatory Visit: Payer: Self-pay

## 2022-12-09 DIAGNOSIS — S299XXA Unspecified injury of thorax, initial encounter: Secondary | ICD-10-CM | POA: Diagnosis present

## 2022-12-09 DIAGNOSIS — I6523 Occlusion and stenosis of bilateral carotid arteries: Secondary | ICD-10-CM | POA: Diagnosis not present

## 2022-12-09 DIAGNOSIS — N3000 Acute cystitis without hematuria: Secondary | ICD-10-CM | POA: Insufficient documentation

## 2022-12-09 DIAGNOSIS — R279 Unspecified lack of coordination: Secondary | ICD-10-CM | POA: Diagnosis not present

## 2022-12-09 DIAGNOSIS — Z950 Presence of cardiac pacemaker: Secondary | ICD-10-CM | POA: Diagnosis not present

## 2022-12-09 DIAGNOSIS — R930 Abnormal findings on diagnostic imaging of skull and head, not elsewhere classified: Secondary | ICD-10-CM | POA: Diagnosis not present

## 2022-12-09 DIAGNOSIS — I739 Peripheral vascular disease, unspecified: Secondary | ICD-10-CM | POA: Diagnosis not present

## 2022-12-09 DIAGNOSIS — G319 Degenerative disease of nervous system, unspecified: Secondary | ICD-10-CM | POA: Diagnosis not present

## 2022-12-09 DIAGNOSIS — W19XXXA Unspecified fall, initial encounter: Secondary | ICD-10-CM | POA: Insufficient documentation

## 2022-12-09 DIAGNOSIS — J189 Pneumonia, unspecified organism: Secondary | ICD-10-CM | POA: Diagnosis not present

## 2022-12-09 DIAGNOSIS — S199XXA Unspecified injury of neck, initial encounter: Secondary | ICD-10-CM | POA: Diagnosis not present

## 2022-12-09 DIAGNOSIS — I959 Hypotension, unspecified: Secondary | ICD-10-CM | POA: Diagnosis not present

## 2022-12-09 DIAGNOSIS — R7989 Other specified abnormal findings of blood chemistry: Secondary | ICD-10-CM

## 2022-12-09 DIAGNOSIS — S301XXA Contusion of abdominal wall, initial encounter: Secondary | ICD-10-CM | POA: Diagnosis not present

## 2022-12-09 DIAGNOSIS — S0990XA Unspecified injury of head, initial encounter: Secondary | ICD-10-CM | POA: Diagnosis not present

## 2022-12-09 DIAGNOSIS — R9431 Abnormal electrocardiogram [ECG] [EKG]: Secondary | ICD-10-CM | POA: Diagnosis not present

## 2022-12-09 DIAGNOSIS — Z743 Need for continuous supervision: Secondary | ICD-10-CM | POA: Diagnosis not present

## 2022-12-09 DIAGNOSIS — T1490XA Injury, unspecified, initial encounter: Secondary | ICD-10-CM | POA: Diagnosis not present

## 2022-12-09 DIAGNOSIS — S20211A Contusion of right front wall of thorax, initial encounter: Secondary | ICD-10-CM | POA: Insufficient documentation

## 2022-12-09 DIAGNOSIS — R404 Transient alteration of awareness: Secondary | ICD-10-CM | POA: Diagnosis not present

## 2022-12-09 DIAGNOSIS — R778 Other specified abnormalities of plasma proteins: Secondary | ICD-10-CM | POA: Insufficient documentation

## 2022-12-09 DIAGNOSIS — M4050 Lordosis, unspecified, site unspecified: Secondary | ICD-10-CM | POA: Diagnosis not present

## 2022-12-09 LAB — URINALYSIS, ROUTINE W REFLEX MICROSCOPIC
Bilirubin Urine: NEGATIVE
Glucose, UA: NEGATIVE mg/dL
Ketones, ur: NEGATIVE mg/dL
Nitrite: NEGATIVE
Protein, ur: 30 mg/dL — AB
Specific Gravity, Urine: 1.011 (ref 1.005–1.030)
WBC, UA: >50 WBC/hpf — ABNORMAL HIGH (ref 0–5)
pH: 6 (ref 5.0–8.0)

## 2022-12-09 LAB — CBC
HCT: 38.4 % — ABNORMAL LOW (ref 39.0–52.0)
Hemoglobin: 12.5 g/dL — ABNORMAL LOW (ref 13.0–17.0)
MCH: 33.9 pg (ref 26.0–34.0)
MCHC: 32.6 g/dL (ref 30.0–36.0)
MCV: 104.1 fL — ABNORMAL HIGH (ref 80.0–100.0)
Platelets: 155 10*3/uL (ref 150–400)
RBC: 3.69 MIL/uL — ABNORMAL LOW (ref 4.22–5.81)
RDW: 12.6 % (ref 11.5–15.5)
WBC: 12.2 10*3/uL — ABNORMAL HIGH (ref 4.0–10.5)
nRBC: 0 % (ref 0.0–0.2)

## 2022-12-09 LAB — COMPREHENSIVE METABOLIC PANEL
ALT: 22 U/L (ref 0–44)
AST: 37 U/L (ref 15–41)
Albumin: 3 g/dL — ABNORMAL LOW (ref 3.5–5.0)
Alkaline Phosphatase: 79 U/L (ref 38–126)
Anion gap: 9 (ref 5–15)
BUN: 44 mg/dL — ABNORMAL HIGH (ref 8–23)
CO2: 29 mmol/L (ref 22–32)
Calcium: 12.3 mg/dL — ABNORMAL HIGH (ref 8.9–10.3)
Chloride: 106 mmol/L (ref 98–111)
Creatinine, Ser: 1.72 mg/dL — ABNORMAL HIGH (ref 0.61–1.24)
GFR, Estimated: 35 mL/min — ABNORMAL LOW (ref >60)
Glucose, Bld: 100 mg/dL — ABNORMAL HIGH (ref 70–99)
Potassium: 3.3 mmol/L — ABNORMAL LOW (ref 3.5–5.1)
Sodium: 144 mmol/L (ref 135–145)
Total Bilirubin: 1.1 mg/dL (ref 0.3–1.2)
Total Protein: 5.8 g/dL — ABNORMAL LOW (ref 6.5–8.1)

## 2022-12-09 LAB — CK: Total CK: 202 U/L (ref 49–397)

## 2022-12-09 LAB — TROPONIN I (HIGH SENSITIVITY)
Troponin I (High Sensitivity): 38 ng/L — ABNORMAL HIGH (ref <18)
Troponin I (High Sensitivity): 45 ng/L — ABNORMAL HIGH (ref <18)

## 2022-12-09 MED ORDER — AMOXICILLIN 500 MG PO CAPS: 500.0000 mg | ORAL_CAPSULE | Freq: Two times a day (BID) | ORAL | 0 refills | Status: AC

## 2022-12-09 MED ORDER — ACETAMINOPHEN 500 MG PO TABS
1000.0000 mg | ORAL_TABLET | Freq: Once | ORAL | Status: AC
  Administered 2022-12-09: 1000 mg via ORAL
  Filled 2022-12-09: qty 2

## 2022-12-09 MED ORDER — AMOXICILLIN 250 MG PO CAPS
500.0000 mg | ORAL_CAPSULE | ORAL | Status: AC
  Administered 2022-12-09: 500 mg via ORAL
  Filled 2022-12-09: qty 2

## 2022-12-09 NOTE — Discharge Instructions (Signed)
You were seen for your fall in the emergency department. You did not have any traumatic injuries but were found to have a urinary tract infection.  At home, please take the antibiotics we gave you.    Follow-up with your primary doctor in 2-3 days regarding your visit.    Return immediately to the emergency department if you experience any of the following: fevers, flank pain, severe headaches, or any other concerning symptoms.    Thank you for visiting our Emergency Department. It was a pleasure taking care of you today.

## 2022-12-09 NOTE — ED Triage Notes (Signed)
Pt arrived from [facility] via [facility] s/p unwitnessed fall. Found face down. Denies blood thinner use.

## 2022-12-09 NOTE — ED Provider Notes (Signed)
[facility] EMERGENCY DEPARTMENT Provider Note   CSN: [id number] Arrival date & time: 12/09/22  9326     History  Chief Complaint  Patient presents with   Lytle Michaels    Chase Chase is a 88 y.o. male.  88 year old male with a history of complete heart block status post Biotronik pacemaker and frequent urinary tract infections who presents emergency department with fall.  History obtained per the patient's daughter who reports that he has frequent falls.  In the past several days has been attempting to ambulate and move around more without assistance.  Unsure if he has been more confused than usual as well.  Had a fall last night where he was found facedown in his unit by staff at [facility].  Feels that he could not have been on the ground more than a few hours.  Not on blood thinners.  Recently treated for a pneumonia and reports that his symptoms are improving.       Home Medications Prior to Admission medications   Medication Sig Start Date End Date Taking? Authorizing Provider  amoxicillin (AMOXIL) 500 MG capsule Take 1 capsule (500 mg total) by mouth 2 (two) times daily for 5 days. 12/09/22 12/14/22 Yes Fransico Meadow, MD  alfuzosin (UROXATRAL) 10 MG 24 hr tablet Take 10 mg by mouth daily. 11/14/22   [provider]  Cholecalciferol (VITAMIN D3) 125 MCG (5000 UT) CAPS Take 5,000 Units by mouth See admin instructions. Take 1 capsule by mouth for 10 days every month    [provider]  latanoprost (XALATAN) 0.005 % ophthalmic solution Place 1 drop into both eyes at bedtime as needed.  08/01/19   [provider]  levothyroxine (SYNTHROID) 50 MCG tablet Take 50 mcg by mouth daily. 12/19/19   [provider]  lidocaine (XYLOCAINE) 2 % solution  09/25/22   [provider]  meclizine (ANTIVERT) 25 MG tablet Take 25 mg by mouth as needed for dizziness (rarely needs). Patient not taking: Reported on 11/22/2022    [provider]  Misc  Natural Products (Surprise) Take 2 tablets by mouth daily.    [provider]  Omega 3 1200 MG CAPS Take 1 capsule by mouth daily.    [provider]  Omega-3 Fatty Acids (FISH OIL) 645 MG CAPS Take 1,290 mg by mouth daily. Take 2 capsules daily for cholesterol. Patient not taking: Reported on 11/22/2022    [provider]  ondansetron (ZOFRAN-ODT) 4 MG disintegrating tablet Take 1 tablet (4 mg total) by mouth every 8 (eight) hours as needed for nausea or vomiting. 11/22/22   Elgie Congo, MD  zinc gluconate 50 MG tablet Take 50 mg by mouth daily.    [provider]      Allergies    Patient has no known allergies.    Review of Systems   Review of Systems  Physical Exam Updated Vital Signs BP 104/71 (BP Location: Right Arm)   Pulse 72   Temp 98.2 F (36.8 C) (Oral)   Resp 16   SpO2 98%  Physical Exam Vitals and nursing note reviewed.  Constitutional:      General: He is not in acute distress.    Appearance: Normal appearance. He is well-developed. He is not ill-appearing.     Comments: Hard of hearing.  Does not have hearing aids so has some difficulty following commands but is able to follow visual instructions.  HENT:     Head: Normocephalic and  atraumatic.     Right Ear: External ear normal.     Left Ear: External ear normal.     Nose: Nose normal.     Mouth/Throat:     Mouth: Mucous membranes are moist.     Pharynx: Oropharynx is clear.  Eyes:     Extraocular Movements: Extraocular movements intact.     Conjunctiva/sclera: Conjunctivae normal.     Pupils: Pupils are equal, round, and reactive to light.  Neck:     Comments: No spinal midline tenderness to palpation or step-offs Cardiovascular:     Rate and Rhythm: Normal rate and regular rhythm.     Pulses: Normal pulses.     Heart sounds: Normal heart sounds.  Pulmonary:     Effort: Pulmonary effort is normal. No respiratory distress.     Breath sounds: Normal  breath sounds.     Comments: Bruising to right chest wall and right flank.  Per daughter this is old. Abdominal:     General: Abdomen is flat. There is no distension.     Palpations: Abdomen is soft. There is no mass.     Tenderness: There is no abdominal tenderness. There is no guarding.  Musculoskeletal:        General: No deformity. Normal range of motion.     Cervical back: Normal range of motion and neck supple. No rigidity or tenderness.     Right lower leg: No edema.     Left lower leg: No edema.     Comments: No tenderness to palpation of midline thoracic or lumbar spine.  No step-offs palpated.  No tenderness to palpation of chest wall.  No tenderness to palpation of bilateral clavicles.  No tenderness to palpation, bruising, or deformities noted of bilateral shoulders, elbows, wrists, hips, knees, or ankles.  Skin:    General: Skin is warm and dry.  Neurological:     General: No focal deficit present.     Mental Status: He is alert and oriented to person, place, and time. Mental status is at baseline.     Cranial Nerves: No cranial nerve deficit.     Sensory: No sensory deficit.     Motor: No weakness.  Psychiatric:        Mood and Affect: Mood normal.        Behavior: Behavior normal.     ED Results / Procedures / Treatments   Labs (all labs ordered are listed, but only abnormal results are displayed) Labs Reviewed  CBC - Abnormal; Notable for the following components:      Result Value   WBC 12.2 (*)    RBC 3.69 (*)    Hemoglobin 12.5 (*)    HCT 38.4 (*)    MCV 104.1 (*)    All other components within normal limits  COMPREHENSIVE METABOLIC PANEL - Abnormal; Notable for the following components:   Potassium 3.3 (*)    Glucose, Bld 100 (*)    BUN 44 (*)    Creatinine, Ser 1.72 (*)    Calcium 12.3 (*)    Total Protein 5.8 (*)    Albumin 3.0 (*)    GFR, Estimated 35 (*)    All other components within normal limits  URINALYSIS, ROUTINE W REFLEX MICROSCOPIC -  Abnormal; Notable for the following components:   APPearance HAZY (*)    Hgb urine dipstick SMALL (*)    Protein, ur 30 (*)    Leukocytes,Ua LARGE (*)    WBC, UA >50 (*)  Bacteria, UA RARE (*)    All other components within normal limits  TROPONIN I (HIGH SENSITIVITY) - Abnormal; Notable for the following components:   Troponin I (High Sensitivity) 38 (*)    All other components within normal limits  TROPONIN I (HIGH SENSITIVITY) - Abnormal; Notable for the following components:   Troponin I (High Sensitivity) 45 (*)    All other components within normal limits  URINE CULTURE  CK    EKG EKG Interpretation  Date/Time:  Saturday December 09 2022 06:54:21 EST Ventricular Rate:  96 PR Interval:  182 QRS Duration: 168 QT Interval:  424 QTC Calculation: 536 R Axis:   263 Text Interpretation: Baseline wander Sinus rhythm Nonspecific IVCD with LAD First degree heart block No significant change since last tracing Confirmed by Margaretmary Eddy [phone]) on 12/09/2022 7:03:20 AM  Radiology DG Chest 2 View  Result Date: 12/09/2022 CLINICAL DATA:  Recent pneumonia.  Evaluate for resolution. EXAM: CHEST - 2 VIEW COMPARISON:  12/02/2022 FINDINGS: Stable appearance of pacemaker. Heart size is enlarged, stable in configuration. RIGHT lung is clear. There is improving aeration at the LEFT lung base, with the LEFT hemidiaphragm now partially visualized. No new consolidations. IMPRESSION: Improving aeration at the LEFT lung base. Electronically Signed   By: Nolon Nations M.D.   On: 12/09/2022 08:46   CT Head Wo Contrast  Result Date: 12/09/2022 CLINICAL DATA:  Head trauma.  Unwitnessed fall.  Found face down. EXAM: CT HEAD WITHOUT CONTRAST CT CERVICAL SPINE WITHOUT CONTRAST TECHNIQUE: Multidetector CT imaging of the head and cervical spine was performed following the standard protocol without intravenous contrast. Multiplanar CT image reconstructions of the cervical spine were also generated.  RADIATION DOSE REDUCTION: This exam was performed according to the departmental dose-optimization program which includes automated exposure control, adjustment of the mA and/or kV according to patient size and/or use of iterative reconstruction technique. COMPARISON:  10/09/2022 FINDINGS: CT HEAD FINDINGS Brain: There is central and cortical atrophy. Periventricular white matter changes are consistent with small vessel disease. There is no intra or extra-axial fluid collection or mass lesion. The basilar cisterns and ventricles have a normal appearance. There is no CT evidence for acute infarction or hemorrhage. Vascular: There is dense atherosclerotic calcification of internal carotid arteries. No hyperdense vessels. Skull: Normal. Negative for fracture or focal lesion. Sinuses/Orbits: No acute finding. Other: None CT CERVICAL SPINE FINDINGS Alignment: There is loss of cervical lordosis. This may be secondary to splinting, soft tissue injury, or positioning. There is reversal of lordosis at C6, likely degenerative and related to positioning or thoracic kyphosis. Skull base and vertebrae: There is significant patient motion artifact and body habitus artifact, despite repeat imaging. Detail at C6 and C7 is limited. Integrity of these levels cannot be verified. Soft tissues and spinal canal: No prevertebral fluid or swelling. No visible canal hematoma. Disc levels: Significant disc height loss throughout the cervical spine, with loss of lordosis particularly in the mid cervical spine related to degenerative changes. Upper chest: Negative. Other: None IMPRESSION: 1. Atrophy and small vessel disease. 2. No evidence for acute intracranial abnormality. 3. Significant patient motion artifact despite repeat imaging. 4. Integrity of the C6 and C7 vertebral bodies cannot be verified due to patient motion and patient body habitus artifact. 5. Consider repeat CT exam for targeted evaluation of the mid and LOWER cervical spine  when the patient is able. These results were called by telephone at the time of interpretation on 12/09/2022 at 7:41 am to provider [facility]  Heylee Tant , who verbally acknowledged these results. Electronically Signed   By: Nolon Nations M.D.   On: 12/09/2022 07:40   CT Cervical Spine Wo Contrast  Result Date: 12/09/2022 CLINICAL DATA:  Head trauma.  Unwitnessed fall.  Found face down. EXAM: CT HEAD WITHOUT CONTRAST CT CERVICAL SPINE WITHOUT CONTRAST TECHNIQUE: Multidetector CT imaging of the head and cervical spine was performed following the standard protocol without intravenous contrast. Multiplanar CT image reconstructions of the cervical spine were also generated. RADIATION DOSE REDUCTION: This exam was performed according to the departmental dose-optimization program which includes automated exposure control, adjustment of the mA and/or kV according to patient size and/or use of iterative reconstruction technique. COMPARISON:  10/09/2022 FINDINGS: CT HEAD FINDINGS Brain: There is central and cortical atrophy. Periventricular white matter changes are consistent with small vessel disease. There is no intra or extra-axial fluid collection or mass lesion. The basilar cisterns and ventricles have a normal appearance. There is no CT evidence for acute infarction or hemorrhage. Vascular: There is dense atherosclerotic calcification of internal carotid arteries. No hyperdense vessels. Skull: Normal. Negative for fracture or focal lesion. Sinuses/Orbits: No acute finding. Other: None CT CERVICAL SPINE FINDINGS Alignment: There is loss of cervical lordosis. This may be secondary to splinting, soft tissue injury, or positioning. There is reversal of lordosis at C6, likely degenerative and related to positioning or thoracic kyphosis. Skull base and vertebrae: There is significant patient motion artifact and body habitus artifact, despite repeat imaging. Detail at C6 and C7 is limited. Integrity of these levels cannot  be verified. Soft tissues and spinal canal: No prevertebral fluid or swelling. No visible canal hematoma. Disc levels: Significant disc height loss throughout the cervical spine, with loss of lordosis particularly in the mid cervical spine related to degenerative changes. Upper chest: Negative. Other: None IMPRESSION: 1. Atrophy and small vessel disease. 2. No evidence for acute intracranial abnormality. 3. Significant patient motion artifact despite repeat imaging. 4. Integrity of the C6 and C7 vertebral bodies cannot be verified due to patient motion and patient body habitus artifact. 5. Consider repeat CT exam for targeted evaluation of the mid and LOWER cervical spine when the patient is able. These results were called by telephone at the time of interpretation on 12/09/2022 at 7:41 am to provider [facility] , who verbally acknowledged these results. Electronically Signed   By: Nolon Nations M.D.   On: 12/09/2022 07:40    Procedures Procedures   Medications Ordered in ED Medications  amoxicillin (AMOXIL) capsule 500 mg (500 mg Oral Given 12/09/22 1132)  acetaminophen (TYLENOL) tablet 1,000 mg (1,000 mg Oral Given 12/09/22 1132)    ED Course/ Medical Decision Making/ A&P                           Medical Decision Making Amount and/or Complexity of Data Reviewed Labs: ordered. Radiology: ordered.  Risk OTC drugs. Prescription drug management.   Chase Chase is a 88 y.o. male with comorbidities that complicate the patient evaluation including frequent UTIs, complete heart block status post pacemaker, and frequent falls who presents to the emergency department with fall  Initial Ddx:  Arrhythmia, MI, mechanical fall, ICH, C-spine injury, rib fracture  MDM:  Unclear what could have caused the patient's fall.  Feel that it was likely either mechanical or cardiogenic in nature.  No history of seizures that would cause him to fall.  Given his age will obtain CT of the head  and  C-spine.  Will also obtain x-ray to evaluate for possible rib fracture.  Does have a history of frequent UTIs so we will also check for UTI at this time since he has been more confused than usual recently.  With unknown downtime will obtain CK.  Plan:  Labs Troponin CKD Urinalysis CT head and C-spine  ED Summary/Re-evaluation:  Patient reevaluated and was stable.  CT head and C-spine were limited somewhat due to motion but did not show any acute abnormalities.  Given the patient's stable exam feel that [place] highly unlikely.  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  Patient was noted to have mildly elevated troponin.  Did discuss this with the patient's daughter who states that they would not want for him to be admitted at this time for stress testing or heart catheterization given the patient's age and comorbidities.  Understands that patient could potentially have a heart attack which could be fatal but feels that it would be in his best interest to send him back to his facility at this time where he would be more comfortable.  No additional abnormalities were noted in the patient's workup.  Will have him follow-up with his primary doctor in several days.  This patient presents to the ED for concern of complaints listed in HPI, this involves an extensive number of treatment options, and is a complaint that carries with it a high risk of complications and morbidity. Disposition including potential need for admission considered.   Dispo: DC to Facility  Additional history obtained from daughter Records reviewed Outpatient Clinic Notes and ED Visit Notes The following labs were independently interpreted: Chemistry and show CKD I independently reviewed the following imaging with scope of interpretation limited to determining acute life threatening conditions related to emergency care: CT Head and agree  with the radiologist interpretation with the following exceptions: None  I personally reviewed and interpreted the pt's EKG: see above for interpretation  I have reviewed the patients home medications and made adjustments as needed  Social Determinants of health:  Elderly SNF resident  Final Clinical Impression(s) / ED Diagnoses Final diagnoses:  Fall, initial encounter  Acute cystitis without hematuria  Elevated troponin    Rx / DC Orders ED Discharge Orders          Ordered    amoxicillin (AMOXIL) 500 MG capsule  2 times daily        12/09/22 1043              Fransico Meadow, MD 12/09/22 2030

## 2022-12-11 DIAGNOSIS — W19XXXA Unspecified fall, initial encounter: Secondary | ICD-10-CM | POA: Diagnosis not present

## 2022-12-11 DIAGNOSIS — R279 Unspecified lack of coordination: Secondary | ICD-10-CM | POA: Diagnosis not present

## 2022-12-11 DIAGNOSIS — H409 Unspecified glaucoma: Secondary | ICD-10-CM | POA: Diagnosis not present

## 2022-12-11 DIAGNOSIS — W050XXA Fall from non-moving wheelchair, initial encounter: Secondary | ICD-10-CM | POA: Diagnosis not present

## 2022-12-11 DIAGNOSIS — Z743 Need for continuous supervision: Secondary | ICD-10-CM | POA: Diagnosis not present

## 2022-12-11 DIAGNOSIS — S0990XA Unspecified injury of head, initial encounter: Secondary | ICD-10-CM | POA: Diagnosis not present

## 2022-12-11 DIAGNOSIS — R5381 Other malaise: Secondary | ICD-10-CM | POA: Diagnosis not present

## 2022-12-11 DIAGNOSIS — I6789 Other cerebrovascular disease: Secondary | ICD-10-CM | POA: Diagnosis not present

## 2022-12-11 DIAGNOSIS — N400 Enlarged prostate without lower urinary tract symptoms: Secondary | ICD-10-CM | POA: Diagnosis not present

## 2022-12-11 DIAGNOSIS — F0390 Unspecified dementia without behavioral disturbance: Secondary | ICD-10-CM | POA: Diagnosis not present

## 2022-12-11 DIAGNOSIS — Z950 Presence of cardiac pacemaker: Secondary | ICD-10-CM | POA: Diagnosis not present

## 2022-12-11 DIAGNOSIS — R404 Transient alteration of awareness: Secondary | ICD-10-CM | POA: Diagnosis not present

## 2022-12-11 DIAGNOSIS — I959 Hypotension, unspecified: Secondary | ICD-10-CM | POA: Diagnosis not present

## 2022-12-11 DIAGNOSIS — Z66 Do not resuscitate: Secondary | ICD-10-CM | POA: Diagnosis not present

## 2022-12-11 DIAGNOSIS — E079 Disorder of thyroid, unspecified: Secondary | ICD-10-CM | POA: Diagnosis not present

## 2022-12-13 DIAGNOSIS — J90 Pleural effusion, not elsewhere classified: Secondary | ICD-10-CM | POA: Diagnosis not present

## 2022-12-13 DIAGNOSIS — I442 Atrioventricular block, complete: Secondary | ICD-10-CM | POA: Diagnosis not present

## 2022-12-13 DIAGNOSIS — N400 Enlarged prostate without lower urinary tract symptoms: Secondary | ICD-10-CM | POA: Diagnosis not present

## 2022-12-13 DIAGNOSIS — F0280 Dementia in other diseases classified elsewhere without behavioral disturbance: Secondary | ICD-10-CM | POA: Diagnosis not present

## 2022-12-13 DIAGNOSIS — H409 Unspecified glaucoma: Secondary | ICD-10-CM | POA: Diagnosis not present

## 2022-12-13 DIAGNOSIS — N390 Urinary tract infection, site not specified: Secondary | ICD-10-CM | POA: Diagnosis not present

## 2022-12-13 DIAGNOSIS — R222 Localized swelling, mass and lump, trunk: Secondary | ICD-10-CM | POA: Diagnosis not present

## 2022-12-13 DIAGNOSIS — G311 Senile degeneration of brain, not elsewhere classified: Secondary | ICD-10-CM | POA: Diagnosis not present

## 2022-12-13 DIAGNOSIS — E785 Hyperlipidemia, unspecified: Secondary | ICD-10-CM | POA: Diagnosis not present

## 2022-12-13 DIAGNOSIS — Z950 Presence of cardiac pacemaker: Secondary | ICD-10-CM | POA: Diagnosis not present

## 2022-12-13 DIAGNOSIS — E039 Hypothyroidism, unspecified: Secondary | ICD-10-CM | POA: Diagnosis not present

## 2022-12-13 LAB — URINE CULTURE: Culture: 60000 — AB

## 2022-12-14 ENCOUNTER — Telehealth (HOSPITAL_BASED_OUTPATIENT_CLINIC_OR_DEPARTMENT_OTHER): Payer: Self-pay | Admitting: *Deleted

## 2022-12-14 DIAGNOSIS — I442 Atrioventricular block, complete: Secondary | ICD-10-CM | POA: Diagnosis not present

## 2022-12-14 DIAGNOSIS — Z950 Presence of cardiac pacemaker: Secondary | ICD-10-CM | POA: Diagnosis not present

## 2022-12-14 DIAGNOSIS — N400 Enlarged prostate without lower urinary tract symptoms: Secondary | ICD-10-CM | POA: Diagnosis not present

## 2022-12-14 DIAGNOSIS — G311 Senile degeneration of brain, not elsewhere classified: Secondary | ICD-10-CM | POA: Diagnosis not present

## 2022-12-14 DIAGNOSIS — F0280 Dementia in other diseases classified elsewhere without behavioral disturbance: Secondary | ICD-10-CM | POA: Diagnosis not present

## 2022-12-14 DIAGNOSIS — N390 Urinary tract infection, site not specified: Secondary | ICD-10-CM | POA: Diagnosis not present

## 2022-12-14 NOTE — Telephone Encounter (Signed)
Post ED Visit - Positive Culture Follow-up  Culture report reviewed by antimicrobial stewardship pharmacist: [facility] Team '[]'$  Elenor Quinones, Pharm.D. '[]'$  Heide Guile, Pharm.D., BCPS AQ-ID '[]'$  Parks Neptune, Pharm.D., BCPS '[]'$  Alycia Rossetti, Pharm.D., BCPS '[]'$  [place]riz[place], Pharm.D., BCPS, AAHIVP '[]'$  [name], Pharm.D., BCPS, AAHIVP '[]'$  Salome Arnt, PharmD, BCPS '[]'$  Johnnette Gourd, PharmD, BCPS '[]'$  Hughes Better, PharmD, BCPS '[]'$  Leeroy Cha, PharmD '[]'$  Laqueta Linden, PharmD, BCPS '[]'$  Albertina Parr, PharmD  [place] Team '[]'$  Leodis Sias, PharmD '[]'$  Lindell Spar, PharmD '[]'$  Royetta Asal, PharmD '[]'$  Graylin Shiver, Rph '[]'$  Rema Fendt) Glennon Mac, PharmD '[]'$  Arlyn Dunning, PharmD '[]'$  Netta Cedars, PharmD '[]'$  Dia Sitter, PharmD '[]'$  Leone Haven, PharmD '[]'$  Gretta Arab, PharmD '[]'$  Theodis Shove, PharmD '[]'$  Peggyann Juba, PharmD '[]'$  Reuel Boom, PharmD   Positive urine culture No bacteriuria and no further patient follow-up is required at this time. Britni [place], PAC  Harlon Flor Talley 12/14/2022, 2:59 PM

## 2022-12-15 DIAGNOSIS — Z950 Presence of cardiac pacemaker: Secondary | ICD-10-CM | POA: Diagnosis not present

## 2022-12-15 DIAGNOSIS — I442 Atrioventricular block, complete: Secondary | ICD-10-CM | POA: Diagnosis not present

## 2022-12-15 DIAGNOSIS — G311 Senile degeneration of brain, not elsewhere classified: Secondary | ICD-10-CM | POA: Diagnosis not present

## 2022-12-15 DIAGNOSIS — F0280 Dementia in other diseases classified elsewhere without behavioral disturbance: Secondary | ICD-10-CM | POA: Diagnosis not present

## 2022-12-15 DIAGNOSIS — N400 Enlarged prostate without lower urinary tract symptoms: Secondary | ICD-10-CM | POA: Diagnosis not present

## 2022-12-15 DIAGNOSIS — N390 Urinary tract infection, site not specified: Secondary | ICD-10-CM | POA: Diagnosis not present

## 2022-12-16 DIAGNOSIS — I442 Atrioventricular block, complete: Secondary | ICD-10-CM | POA: Diagnosis not present

## 2022-12-16 DIAGNOSIS — F0280 Dementia in other diseases classified elsewhere without behavioral disturbance: Secondary | ICD-10-CM | POA: Diagnosis not present

## 2022-12-16 DIAGNOSIS — N400 Enlarged prostate without lower urinary tract symptoms: Secondary | ICD-10-CM | POA: Diagnosis not present

## 2022-12-16 DIAGNOSIS — N390 Urinary tract infection, site not specified: Secondary | ICD-10-CM | POA: Diagnosis not present

## 2022-12-16 DIAGNOSIS — G311 Senile degeneration of brain, not elsewhere classified: Secondary | ICD-10-CM | POA: Diagnosis not present

## 2022-12-16 DIAGNOSIS — Z950 Presence of cardiac pacemaker: Secondary | ICD-10-CM | POA: Diagnosis not present

## 2022-12-17 DIAGNOSIS — N390 Urinary tract infection, site not specified: Secondary | ICD-10-CM | POA: Diagnosis not present

## 2022-12-17 DIAGNOSIS — I442 Atrioventricular block, complete: Secondary | ICD-10-CM | POA: Diagnosis not present

## 2022-12-17 DIAGNOSIS — Z950 Presence of cardiac pacemaker: Secondary | ICD-10-CM | POA: Diagnosis not present

## 2022-12-17 DIAGNOSIS — G311 Senile degeneration of brain, not elsewhere classified: Secondary | ICD-10-CM | POA: Diagnosis not present

## 2022-12-17 DIAGNOSIS — N400 Enlarged prostate without lower urinary tract symptoms: Secondary | ICD-10-CM | POA: Diagnosis not present

## 2022-12-17 DIAGNOSIS — F0280 Dementia in other diseases classified elsewhere without behavioral disturbance: Secondary | ICD-10-CM | POA: Diagnosis not present

## 2022-12-18 DIAGNOSIS — Z950 Presence of cardiac pacemaker: Secondary | ICD-10-CM | POA: Diagnosis not present

## 2022-12-18 DIAGNOSIS — F0280 Dementia in other diseases classified elsewhere without behavioral disturbance: Secondary | ICD-10-CM | POA: Diagnosis not present

## 2022-12-18 DIAGNOSIS — N400 Enlarged prostate without lower urinary tract symptoms: Secondary | ICD-10-CM | POA: Diagnosis not present

## 2022-12-18 DIAGNOSIS — G311 Senile degeneration of brain, not elsewhere classified: Secondary | ICD-10-CM | POA: Diagnosis not present

## 2022-12-18 DIAGNOSIS — I442 Atrioventricular block, complete: Secondary | ICD-10-CM | POA: Diagnosis not present

## 2022-12-18 DIAGNOSIS — N390 Urinary tract infection, site not specified: Secondary | ICD-10-CM | POA: Diagnosis not present

## 2022-12-25 ENCOUNTER — Ambulatory Visit: Payer: Medicare Other

## 2022-12-26 LAB — CUP PACEART REMOTE DEVICE CHECK
Date Time Interrogation Session: 20240115121050
Implantable Lead Connection Status: 753985
Implantable Lead Connection Status: 753985
Implantable Lead Implant Date: 20201009
Implantable Lead Implant Date: 20201009
Implantable Lead Location: 753859
Implantable Lead Location: 753860
Implantable Lead Model: 377
Implantable Lead Model: 377
Implantable Lead Serial Number: 81023057
Implantable Lead Serial Number: 81187700
Implantable Pulse Generator Implant Date: 20201009
Pulse Gen Model: 407145
Pulse Gen Serial Number: 69608254

## 2023-01-09 ENCOUNTER — Telehealth: Payer: Self-pay

## 2023-01-09 NOTE — Telephone Encounter (Signed)
I spoke with the patient daughter and he passed away 2023/01/07. I canceled all upcoming remotes, took him out of [employer], and deactivated him in Biotronik.

## 2023-01-09 NOTE — Telephone Encounter (Signed)
Biotronik alert:  No messages received for at least 21 days Last message received 22 days ago. The patient will be deactivated in 68 days.   Please check with patient regarding his remote monitor not communicating. Thanks.

## 2023-01-11 DEATH — deceased

## 2023-01-26 ENCOUNTER — Ambulatory Visit: Payer: Medicare Other | Admitting: Urology

## 2023-04-20 ENCOUNTER — Ambulatory Visit: Payer: Medicare Other | Admitting: Urology
